# Patient Record
Sex: Male | Born: 1935 | Race: White | Hispanic: No | Marital: Married | Smoking: Former smoker
Health system: Southern US, Community
[De-identification: ages and names within clinical notes are randomized; demographics above are authoritative.]

## PROBLEM LIST (undated history)

## (undated) DIAGNOSIS — N183 Chronic kidney disease, stage 3 unspecified: Secondary | ICD-10-CM

## (undated) DIAGNOSIS — C801 Malignant (primary) neoplasm, unspecified: Secondary | ICD-10-CM

## (undated) DIAGNOSIS — Z87891 Personal history of nicotine dependence: Secondary | ICD-10-CM

## (undated) DIAGNOSIS — J449 Chronic obstructive pulmonary disease, unspecified: Secondary | ICD-10-CM

## (undated) DIAGNOSIS — G4733 Obstructive sleep apnea (adult) (pediatric): Secondary | ICD-10-CM

## (undated) DIAGNOSIS — T7840XA Allergy, unspecified, initial encounter: Secondary | ICD-10-CM

## (undated) DIAGNOSIS — Z9114 Patient's other noncompliance with medication regimen: Secondary | ICD-10-CM

## (undated) DIAGNOSIS — Z91148 Patient's other noncompliance with medication regimen for other reason: Secondary | ICD-10-CM

## (undated) DIAGNOSIS — E785 Hyperlipidemia, unspecified: Secondary | ICD-10-CM

## (undated) DIAGNOSIS — E1336 Other specified diabetes mellitus with diabetic cataract: Secondary | ICD-10-CM

## (undated) DIAGNOSIS — R319 Hematuria, unspecified: Secondary | ICD-10-CM

## (undated) DIAGNOSIS — I10 Essential (primary) hypertension: Secondary | ICD-10-CM

## (undated) DIAGNOSIS — K219 Gastro-esophageal reflux disease without esophagitis: Secondary | ICD-10-CM

## (undated) HISTORY — DX: Personal history of nicotine dependence: Z87.891

## (undated) HISTORY — DX: Patient's other noncompliance with medication regimen: Z91.14

## (undated) HISTORY — DX: Patient's other noncompliance with medication regimen for other reason: Z91.148

## (undated) HISTORY — DX: Hyperlipidemia, unspecified: E78.5

## (undated) HISTORY — DX: Malignant (primary) neoplasm, unspecified: C80.1

## (undated) HISTORY — DX: Obstructive sleep apnea (adult) (pediatric): G47.33

## (undated) HISTORY — DX: Allergy, unspecified, initial encounter: T78.40XA

## (undated) HISTORY — DX: Other specified diabetes mellitus with diabetic cataract: E13.36

## (undated) HISTORY — PX: SKIN GRAFT: SHX250

## (undated) HISTORY — DX: Chronic obstructive pulmonary disease, unspecified: J44.9

## (undated) HISTORY — PX: CATARACT EXTRACTION: SUR2

## (undated) HISTORY — DX: Essential (primary) hypertension: I10

## (undated) HISTORY — DX: Gastro-esophageal reflux disease without esophagitis: K21.9

## (undated) HISTORY — DX: Chronic kidney disease, stage 3 unspecified: N18.30

## (undated) HISTORY — DX: Chronic kidney disease, stage 3 (moderate): N18.3

## (undated) HISTORY — PX: ELBOW SURGERY: SHX618

## (undated) HISTORY — DX: Hematuria, unspecified: R31.9

---

## 2001-01-28 DIAGNOSIS — K219 Gastro-esophageal reflux disease without esophagitis: Secondary | ICD-10-CM

## 2001-01-28 HISTORY — DX: Gastro-esophageal reflux disease without esophagitis: K21.9

## 2003-11-07 ENCOUNTER — Other Ambulatory Visit: Payer: Self-pay

## 2003-11-07 ENCOUNTER — Ambulatory Visit: Payer: Self-pay | Admitting: Ophthalmology

## 2003-11-15 ENCOUNTER — Ambulatory Visit: Payer: Self-pay | Admitting: Ophthalmology

## 2005-01-28 LAB — HM COLONOSCOPY: HM Colonoscopy: NORMAL

## 2005-08-07 ENCOUNTER — Ambulatory Visit: Payer: Self-pay | Admitting: Gastroenterology

## 2007-03-04 ENCOUNTER — Ambulatory Visit: Payer: Self-pay | Admitting: Internal Medicine

## 2007-03-17 ENCOUNTER — Ambulatory Visit: Payer: Self-pay | Admitting: Internal Medicine

## 2007-03-18 ENCOUNTER — Ambulatory Visit: Payer: Self-pay | Admitting: Internal Medicine

## 2007-05-29 ENCOUNTER — Ambulatory Visit: Payer: Self-pay | Admitting: Internal Medicine

## 2007-06-09 ENCOUNTER — Ambulatory Visit: Payer: Self-pay | Admitting: Internal Medicine

## 2007-07-24 ENCOUNTER — Ambulatory Visit: Payer: Self-pay | Admitting: Internal Medicine

## 2007-07-29 ENCOUNTER — Ambulatory Visit: Payer: Self-pay | Admitting: Internal Medicine

## 2009-12-27 ENCOUNTER — Ambulatory Visit: Payer: Self-pay | Admitting: Ophthalmology

## 2010-09-17 ENCOUNTER — Telehealth: Payer: Self-pay | Admitting: Internal Medicine

## 2010-09-17 NOTE — Telephone Encounter (Signed)
What is the easiest way to do this ?

## 2010-09-17 NOTE — Telephone Encounter (Signed)
Patients wife called and wanted to know if you could write a letter stating he is your patient and he uses a CPAP machine.  Patients wife stated their energy bill has been more because of having to use the CPAP machine, so they need the letter so they can get assistance on their energy bill.  Please advise.

## 2010-09-18 ENCOUNTER — Encounter: Payer: Self-pay | Admitting: Internal Medicine

## 2010-09-25 NOTE — Telephone Encounter (Signed)
Per physician resolved.

## 2010-09-26 ENCOUNTER — Other Ambulatory Visit: Payer: Self-pay | Admitting: Internal Medicine

## 2010-09-26 MED ORDER — AMLODIPINE BESYLATE 10 MG PO TABS: 10.0000 mg | ORAL_TABLET | Freq: Every day | ORAL | Status: DC

## 2010-10-04 ENCOUNTER — Other Ambulatory Visit: Payer: Self-pay | Admitting: Internal Medicine

## 2010-10-05 MED ORDER — OMEPRAZOLE 20 MG PO CPDR: 20.0000 mg | DELAYED_RELEASE_CAPSULE | Freq: Every day | ORAL | Status: DC

## 2010-10-18 ENCOUNTER — Encounter: Payer: Self-pay | Admitting: Internal Medicine

## 2010-10-19 ENCOUNTER — Ambulatory Visit (INDEPENDENT_AMBULATORY_CARE_PROVIDER_SITE_OTHER): Payer: Medicare Other | Admitting: Internal Medicine

## 2010-10-19 ENCOUNTER — Encounter: Payer: Self-pay | Admitting: Internal Medicine

## 2010-10-19 VITALS — BP 207/72 | HR 51 | Temp 98.1°F | Resp 16 | Ht 64.0 in | Wt 161.5 lb

## 2010-10-19 DIAGNOSIS — E119 Type 2 diabetes mellitus without complications: Secondary | ICD-10-CM

## 2010-10-19 DIAGNOSIS — E1321 Other specified diabetes mellitus with diabetic nephropathy: Secondary | ICD-10-CM

## 2010-10-19 DIAGNOSIS — I10 Essential (primary) hypertension: Secondary | ICD-10-CM

## 2010-10-19 DIAGNOSIS — N058 Unspecified nephritic syndrome with other morphologic changes: Secondary | ICD-10-CM

## 2010-10-19 DIAGNOSIS — R600 Localized edema: Secondary | ICD-10-CM

## 2010-10-19 DIAGNOSIS — E1329 Other specified diabetes mellitus with other diabetic kidney complication: Secondary | ICD-10-CM

## 2010-10-19 DIAGNOSIS — R609 Edema, unspecified: Secondary | ICD-10-CM

## 2010-10-19 MED ORDER — LOSARTAN POTASSIUM 100 MG PO TABS: 100.0000 mg | ORAL_TABLET | Freq: Every day | ORAL | Status: DC

## 2010-10-19 MED ORDER — LOSARTAN POTASSIUM 50 MG PO TABS: 100.0000 mg | ORAL_TABLET | Freq: Every day | ORAL | Status: DC

## 2010-10-19 MED ORDER — FUROSEMIDE 40 MG PO TABS: 40.0000 mg | ORAL_TABLET | Freq: Two times a day (BID) | ORAL | Status: DC

## 2010-10-19 NOTE — Progress Notes (Signed)
  Subjective:    Patient ID: Jason Aguilar, male    DOB: 08-Mar-1935, 75 y.o.   MRN: [id number]  HPI Worsening lower extremity edema,  Uncontrolled hypertension.       Review of Systems     Objective:   Physical Exam        Assessment & Plan:  Hyperkalemia: in the setting of combined ACE INhibitor and ARB use for control of malignant hypertension.  Will stop lisinopppril, increase furosemdie to 40 mg bid, recheck in 4 days.   Malignant hypertension:  On 4 medicatiosn with noncomplaince an issue,  Last visit he was given clinidine and slept for 24 hours per wife.  Will start hydralazine, stop lisinopril, contineu amloidpine metoprolol and losartan.  Subjective:     Jason Aguilar is a 75 y.o. male who presents for follow up of diabetes.. Current symptoms include: none. Patient denies foot ulcerations. Evaluation to date has been: fasting blood sugar, fasting lipid panel, hemoglobin A1C and microalbuminuria. Home sugars: patient does not check sugars. Current treatments: Increased dose of insulin which has been unable to assess effectiveness and Continued metformin which has been not very effective. Last dilated eye exam unsure per patient.  The following portions of the patient's history were reviewed and updated as appropriate: allergies, current medications, past family history, past medical history, past social history, past surgical history and problem list.  Review of Systems A comprehensive review of systems was negative except for: Cardiovascular: positive for fatigue    Objective:    General appearance: alert, cooperative and appears stated age Nose: Nares normal. Septum midline. Mucosa normal. No drainage or sinus tenderness. Throat: lips, mucosa, and tongue normal; teeth and gums normal Lungs: clear to auscultation bilaterally Chest wall: no tenderness Heart: regular rate and rhythm, S1, S2 normal, no murmur, click, rub or gallop Abdomen: soft, non-tender; bowel sounds  normal; no masses,  no organomegaly Extremities: extremities normal, atraumatic, no cyanosis or edema and edema     Patient was not evaluated for proper footwear and sizing.  Laboratory: No components found with this basename: A1C      Assessment:    Diabetes mellitus Type II, under poor control.    Plan:    Discussed general issues about diabetes pathophysiology and management. Counseling at today's visit: discussed the need for weight loss and discussed the advantages of a diet low in carbohydrates. Reminded to get yearly retinal exam. Increased dose of insulin: by 5 units.

## 2010-10-19 NOTE — Patient Instructions (Signed)
We are increasing the losartan to 100 mg daily for you high blood pressure.  Confirm that you are taking fursoemide twice daily for fluid and blood pressure  Return in 1 months

## 2010-10-20 LAB — COMPREHENSIVE METABOLIC PANEL
ALT: 12 U/L (ref 0–53)
Alkaline Phosphatase: 70 U/L (ref 39–117)
CO2: 19 mEq/L (ref 19–32)
Creat: 1.77 mg/dL — ABNORMAL HIGH (ref 0.50–1.35)
Glucose, Bld: 157 mg/dL — ABNORMAL HIGH (ref 70–99)
Sodium: 140 mEq/L (ref 135–145)
Total Bilirubin: 0.4 mg/dL (ref 0.3–1.2)
Total Protein: 7 g/dL (ref 6.0–8.3)

## 2010-10-20 LAB — HEMOGLOBIN A1C
Hgb A1c MFr Bld: 8.2 % — ABNORMAL HIGH (ref <5.7)
Mean Plasma Glucose: 189 mg/dL — ABNORMAL HIGH (ref <117)

## 2010-10-20 LAB — MICROALBUMIN / CREATININE URINE RATIO
Creatinine, Urine: 90.2 mg/dL
Microalb, Ur: 208.26 mg/dL — ABNORMAL HIGH (ref 0.00–1.89)

## 2010-10-21 ENCOUNTER — Encounter: Payer: Self-pay | Admitting: Internal Medicine

## 2010-10-21 DIAGNOSIS — E785 Hyperlipidemia, unspecified: Secondary | ICD-10-CM | POA: Insufficient documentation

## 2010-10-21 DIAGNOSIS — I10 Essential (primary) hypertension: Secondary | ICD-10-CM | POA: Insufficient documentation

## 2010-10-21 DIAGNOSIS — E1321 Other specified diabetes mellitus with diabetic nephropathy: Secondary | ICD-10-CM | POA: Insufficient documentation

## 2010-10-21 DIAGNOSIS — Z9114 Patient's other noncompliance with medication regimen: Secondary | ICD-10-CM | POA: Insufficient documentation

## 2010-10-21 DIAGNOSIS — G4733 Obstructive sleep apnea (adult) (pediatric): Secondary | ICD-10-CM | POA: Insufficient documentation

## 2010-10-21 MED ORDER — HYDRALAZINE HCL 50 MG PO TABS: 50.0000 mg | ORAL_TABLET | Freq: Three times a day (TID) | ORAL | Status: DC

## 2010-10-21 MED ORDER — INSULIN DETEMIR 100 UNIT/ML ~~LOC~~ SOLN: 30.0000 [IU] | Freq: Every day | SUBCUTANEOUS | Status: DC

## 2010-10-21 NOTE — Assessment & Plan Note (Signed)
Now with increased proteinuria,  Decreased GFR and hyperkalemia by currentl labs.  Will stop lisinopril and continue losartan.  Refer to nephrology.

## 2010-10-22 ENCOUNTER — Ambulatory Visit: Payer: Medicare Other | Admitting: Internal Medicine

## 2010-10-22 ENCOUNTER — Telehealth: Payer: Self-pay | Admitting: Internal Medicine

## 2010-10-22 DIAGNOSIS — I10 Essential (primary) hypertension: Secondary | ICD-10-CM

## 2010-10-22 MED ORDER — FUROSEMIDE 40 MG PO TABS: 40.0000 mg | ORAL_TABLET | Freq: Two times a day (BID) | ORAL | Status: DC

## 2010-10-22 NOTE — Telephone Encounter (Signed)
THE MEDS YOU TALKED TO WITH MR Platz  ABOUT FUROSEMIDE HE DOES NOT HAVE THIS AT HOME AT ALL.  PT NEEDS RX MEDICAP

## 2010-10-22 NOTE — Telephone Encounter (Signed)
Rx has been called in  

## 2010-10-23 ENCOUNTER — Telehealth: Payer: Self-pay | Admitting: Internal Medicine

## 2010-10-23 NOTE — Telephone Encounter (Signed)
Please call [phone] can leave message on voice mail per evelyn

## 2010-10-26 ENCOUNTER — Ambulatory Visit: Payer: Self-pay | Admitting: Internal Medicine

## 2010-11-08 ENCOUNTER — Encounter: Payer: Self-pay | Admitting: Internal Medicine

## 2010-11-19 ENCOUNTER — Ambulatory Visit: Payer: Medicare Other | Admitting: Internal Medicine

## 2010-11-26 ENCOUNTER — Ambulatory Visit: Payer: Medicare Other | Admitting: Internal Medicine

## 2010-12-03 ENCOUNTER — Other Ambulatory Visit: Payer: Self-pay | Admitting: Internal Medicine

## 2010-12-05 MED ORDER — SIMVASTATIN 40 MG PO TABS: 40.0000 mg | ORAL_TABLET | Freq: Every day | ORAL | Status: DC

## 2010-12-13 ENCOUNTER — Ambulatory Visit: Payer: Self-pay | Admitting: Nephrology

## 2010-12-26 ENCOUNTER — Other Ambulatory Visit: Payer: Self-pay | Admitting: Internal Medicine

## 2010-12-26 MED ORDER — MICROLET LANCETS MISC: Status: DC

## 2011-02-04 ENCOUNTER — Other Ambulatory Visit: Payer: Self-pay | Admitting: *Deleted

## 2011-02-04 MED ORDER — INSULIN PEN NEEDLE 31G X 5 MM MISC: Status: DC

## 2011-02-27 ENCOUNTER — Ambulatory Visit: Payer: Medicare Other | Admitting: Endocrinology

## 2011-02-27 ENCOUNTER — Ambulatory Visit: Payer: Self-pay | Admitting: Urology

## 2011-02-27 DIAGNOSIS — I10 Essential (primary) hypertension: Secondary | ICD-10-CM

## 2011-02-27 LAB — BASIC METABOLIC PANEL
BUN: 71 mg/dL — ABNORMAL HIGH (ref 7–18)
Calcium, Total: 9.9 mg/dL (ref 8.5–10.1)
EGFR (African American): 31 — ABNORMAL LOW
EGFR (Non-African Amer.): 25 — ABNORMAL LOW
Glucose: 137 mg/dL — ABNORMAL HIGH (ref 65–99)
Osmolality: 310 (ref 275–301)
Potassium: 4.6 mmol/L (ref 3.5–5.1)
Sodium: 144 mmol/L (ref 136–145)

## 2011-02-27 LAB — HEMOGLOBIN: HGB: 12.7 g/dL — ABNORMAL LOW (ref 13.0–18.0)

## 2011-02-28 LAB — URINE CULTURE

## 2011-03-01 DIAGNOSIS — C801 Malignant (primary) neoplasm, unspecified: Secondary | ICD-10-CM

## 2011-03-01 HISTORY — DX: Malignant (primary) neoplasm, unspecified: C80.1

## 2011-03-04 ENCOUNTER — Other Ambulatory Visit: Payer: Self-pay | Admitting: Internal Medicine

## 2011-03-04 ENCOUNTER — Telehealth: Payer: Self-pay | Admitting: Internal Medicine

## 2011-03-04 NOTE — Telephone Encounter (Signed)
Appt made for surgical clearance.

## 2011-03-04 NOTE — Telephone Encounter (Signed)
I need to do the clearance.

## 2011-03-04 NOTE — Telephone Encounter (Signed)
[phone] Jason Aguilar called pt is having bladder surgery 2/13 and pt needs surgical clearance does dr Darrick Huntsman need to do this or dr Cherylann Ratel because he changed bp meds.  Daughter stated as far as she know bp is stable

## 2011-03-08 ENCOUNTER — Ambulatory Visit (INDEPENDENT_AMBULATORY_CARE_PROVIDER_SITE_OTHER): Payer: Medicare Other | Admitting: Internal Medicine

## 2011-03-08 ENCOUNTER — Encounter: Payer: Self-pay | Admitting: Internal Medicine

## 2011-03-08 VITALS — BP 140/56 | HR 60 | Temp 97.8°F | Wt 158.0 lb

## 2011-03-08 DIAGNOSIS — E119 Type 2 diabetes mellitus without complications: Secondary | ICD-10-CM

## 2011-03-08 DIAGNOSIS — D3500 Benign neoplasm of unspecified adrenal gland: Secondary | ICD-10-CM

## 2011-03-08 DIAGNOSIS — E279 Disorder of adrenal gland, unspecified: Secondary | ICD-10-CM

## 2011-03-08 NOTE — Patient Instructions (Signed)
You can try taking 1 or 2 otc tylenol at bedtime for your nightttime knee pain .   We are arranging the referral to an endocrinologist in [place] to decide what to do about the bilateral adrenal nodules (tumors on your adrenal glands)  I am checking your hgba1c today

## 2011-03-10 NOTE — Progress Notes (Signed)
Subjective:    Jason Aguilar is a 76 y.o. male who presents to the office today for a preoperative consultation at the request of Dr. Lonna Cobb who plans on performing bladder biopsy  on February 20. This consultation is requested for the specific conditions prompting preoperative evaluation (i.e. because of potential affect on operative risk): Diabetes and renal insufficiency. Planned anesthesia: epidural and IV sedation. The patient has the following known anesthesia issues: none. Patients bleeding risk: no recent abnormal bleeding. Patient does not have objections to receiving blood products if needed.  The following portions of the patient's history were reviewed and updated as appropriate: allergies, current medications, past family history, past medical history, past social history, past surgical history and problem list.  Review of Systems A comprehensive review of systems was negative.    Objective:    BP 140/56  Pulse 60  Temp(Src) 97.8 F (36.6 C) (Oral)  Wt 158 lb (71.668 kg)  SpO2 94%  General Appearance:    Alert, cooperative, no distress, appears stated age  Head:    Normocephalic, without obvious abnormality, atraumatic  Eyes:    PERRL, conjunctiva/corneas clear, EOM's intact, fundi    benign, both eyes       Ears:    Normal TM's and external ear canals, both ears  Nose:   Nares normal, septum midline, mucosa normal, no drainage    or sinus tenderness  Throat:   Lips, mucosa, and tongue normal; teeth and gums normal  Neck:   Supple, symmetrical, trachea midline, no adenopathy;       thyroid:  No enlargement/tenderness/nodules; no carotid   bruit or JVD  Back:     Symmetric, no curvature, ROM normal, no CVA tenderness  Lungs:     Clear to auscultation bilaterally, respirations unlabored  Chest wall:    No tenderness or deformity  Heart:    Regular rate and rhythm, S1 and S2 normal, no murmur, rub   or gallop  Abdomen:     Soft, non-tender, bowel sounds active all four  quadrants,    no masses, no organomegaly  Genitalia:    Normal male without lesion, discharge or tenderness  Rectal:    Normal tone, normal prostate, no masses or tenderness;   guaiac negative stool  Extremities:   Extremities normal, atraumatic, no cyanosis or edema  Pulses:   2+ and symmetric all extremities  Skin:   Skin color, texture, turgor normal, no rashes or lesions  Lymph nodes:   Cervical, supraclavicular, and axillary nodes normal  Neurologic:   CNII-XII intact. Normal strength, sensation and reflexes      throughout    Predictors of intubation difficulty:  Morbid obesity? no  Anatomically abnormal facies? no  Prominent incisors? no  Receding mandible? no  Short, thick neck? no  Neck range of motion: normal   Dentition: Missing teeth (several)  Cardiographics ECG: normal sinus rhythm, no blocks or conduction defects, no ischemic changes Echocardiogram: not done  Imaging Chest x-ray: to be ordered by Anaesthesiology   Lab Review  No visits with results within 2 Month(s) from this visit. Latest known visit with results is:  Office Visit on 10/19/2010  Component Date Value  . Sodium 10/19/2010 140   . Potassium 10/19/2010 5.4*  . Chloride 10/19/2010 106   . CO2 10/19/2010 19   . Glucose, Bld 10/19/2010 157*  . BUN 10/19/2010 28*  . Creat 10/19/2010 1.77*  . Total Bilirubin 10/19/2010 0.4   . Alkaline Phosphatase 10/19/2010 70   .  AST 10/19/2010 16   . ALT 10/19/2010 12   . Total Protein 10/19/2010 7.0   . Albumin 10/19/2010 4.4   . Calcium 10/19/2010 11.0*  . Microalb, Ur 10/19/2010 208.26*  . Creatinine, Urine 10/19/2010 90.2   . Microalb Creat Ratio 10/19/2010 2308.9*  . Hemoglobin A1C 10/19/2010 8.2*  . Mean Plasma Glucose 10/19/2010 189*      Assessment:      76 y.o. male with planned surgery as above.   Known risk factors for perioperative complications: Diabetes mellitus   Difficulty with intubation is not anticipated.  Cardiac Risk  Estimation: low to moderate due to history of DM , insulin dependent Current medications which may produce withdrawal symptoms if withheld perioperatively: none    Plan:    1. Preoperative workup as follows none. 2. Change in medication regimen before surgery: discontinue ASA 14 days before surgery and discontinue Metformin 24 hours before surgery. 3. Prophylaxis for cardiac events with perioperative beta-blockers: should be considered, specific regimen per anesthesia. 4. Invasive hemodynamic monitoring perioperatively: not indicated. 5. Deep vein thrombosis prophylaxis postoperatively:regimen to be chosen by surgical team. 6. Surveillance for postoperative MI with ECG immediately postoperatively and on postoperative days 1 and 2 AND troponin levels 24 hours postoperatively and on day 4 or hospital discharge (whichever comes first): should be considered. 7. Other measures: Careful attention to perioperative glycemic control (with insulin sliding scale ).

## 2011-03-13 ENCOUNTER — Ambulatory Visit: Payer: Self-pay | Admitting: Urology

## 2011-03-13 HISTORY — PX: TRANSURETHRAL RESECTION OF BLADDER TUMOR: SHX2575

## 2011-03-15 LAB — PATHOLOGY REPORT

## 2011-03-26 ENCOUNTER — Ambulatory Visit: Payer: Medicare Other | Admitting: Endocrinology

## 2011-04-01 ENCOUNTER — Ambulatory Visit: Payer: Medicare Other | Admitting: Endocrinology

## 2011-04-01 ENCOUNTER — Other Ambulatory Visit: Payer: Self-pay | Admitting: Internal Medicine

## 2011-04-02 ENCOUNTER — Other Ambulatory Visit: Payer: Self-pay | Admitting: Internal Medicine

## 2011-04-02 MED ORDER — GLIPIZIDE 5 MG PO TABS: 5.0000 mg | ORAL_TABLET | Freq: Every day | ORAL | Status: DC

## 2011-04-05 ENCOUNTER — Ambulatory Visit (INDEPENDENT_AMBULATORY_CARE_PROVIDER_SITE_OTHER): Payer: Medicare Other | Admitting: Internal Medicine

## 2011-04-05 ENCOUNTER — Encounter: Payer: Self-pay | Admitting: Internal Medicine

## 2011-04-05 DIAGNOSIS — L608 Other nail disorders: Secondary | ICD-10-CM

## 2011-04-05 DIAGNOSIS — E119 Type 2 diabetes mellitus without complications: Secondary | ICD-10-CM

## 2011-04-05 DIAGNOSIS — IMO0001 Reserved for inherently not codable concepts without codable children: Secondary | ICD-10-CM

## 2011-04-05 DIAGNOSIS — C801 Malignant (primary) neoplasm, unspecified: Secondary | ICD-10-CM

## 2011-04-05 DIAGNOSIS — M545 Low back pain, unspecified: Secondary | ICD-10-CM

## 2011-04-05 DIAGNOSIS — J069 Acute upper respiratory infection, unspecified: Secondary | ICD-10-CM | POA: Insufficient documentation

## 2011-04-05 DIAGNOSIS — E1165 Type 2 diabetes mellitus with hyperglycemia: Secondary | ICD-10-CM

## 2011-04-05 DIAGNOSIS — N058 Unspecified nephritic syndrome with other morphologic changes: Secondary | ICD-10-CM

## 2011-04-05 DIAGNOSIS — E1321 Other specified diabetes mellitus with diabetic nephropathy: Secondary | ICD-10-CM

## 2011-04-05 DIAGNOSIS — Q846 Other congenital malformations of nails: Secondary | ICD-10-CM

## 2011-04-05 DIAGNOSIS — E1329 Other specified diabetes mellitus with other diabetic kidney complication: Secondary | ICD-10-CM

## 2011-04-05 MED ORDER — GUAIFENESIN-CODEINE 100-10 MG/5ML PO SYRP: 5.0000 mL | ORAL_SOLUTION | Freq: Three times a day (TID) | ORAL | Status: AC | PRN

## 2011-04-05 MED ORDER — INSULIN DETEMIR 100 UNIT/ML ~~LOC~~ SOLN: 30.0000 [IU] | Freq: Every day | SUBCUTANEOUS | Status: DC

## 2011-04-05 NOTE — Patient Instructions (Addendum)
You currently have a viral respiratory infection that will run it course in 7 to 10 days.  You do not need antibiotics at this time.  If you develop t > 100.4  Or facial or ear pain, or starts coughing up brownish/green stuff or blowing it out of his nose, it may indicate the need for antibiotics.  Call our office on Monday if no better with the cold (we will add a chest x ray to the lumbar spine x rays you are going to get on Monday)   I am prescribing a cough medicine that has codeine in it to quiet the cough .  (Cheritussin)  Gargle with salt water for scratchy throat.   ------------------------------------------------------------------------------------------------------------------------------------------------------------------------------   You needed xrays for your lumbar spine and these will need to be done at [facility],  This office is on [address] about [address],  across from the [place],  Before you pass [employer] . It is on the [place].

## 2011-04-05 NOTE — Progress Notes (Signed)
Subjective:    Patient ID: Jason Aguilar, male    DOB: 1935/03/12, 76 y.o.   MRN: [id number]  HPI  Jason Aguilar is a 76 yr old white male with a history of remote tobacco abuse,  Diabetes mellitus, and recent diagnosis of bladder cancer s/p recent TURBT by Jason Aguilar who presents with a dry cough which started about 3 days ago,  Temp 99.6 is Tmax.  No sinus pain, headaches or myalgias.  No wheezing or shortness of breath.  Daughter is asking if patient's bladder cancer can be attributed to his history of use of Actos , which he used for 2 years to manage his diabetes prior to use of insulin.  His second issue today is back pain which has been present for several months and radiates to both legs. Pain is persistent,  Aggravated by sitting and walking.   Past Medical History  Diagnosis Date  . Glaucoma     both eyes Jason Aguilar and Jason Aguilar  . Hematuria syndrome     s/p negative cystoscopy  . GERD (gastroesophageal reflux disease) 2003    EGD  . Allergy   . Diabetes mellitus     Type 2  . Hypertension   . Hyperlipidemia   . Obstructive sleep apnea of adult     CPAP at 8 cm H20  . Noncompliance with medication regimen    Current Outpatient Prescriptions on File Prior to Visit  Medication Sig Dispense Refill  . amLODipine (NORVASC) 10 MG tablet Take 1 tablet (10 mg total) by mouth daily.  30 tablet  11  . aspirin 81 MG tablet Take 81 mg by mouth daily.        . fexofenadine (ALLEGRA) 180 MG tablet Take 180 mg by mouth daily.        . furosemide (LASIX) 40 MG tablet Take 1 tablet (40 mg total) by mouth 2 (two) times daily.  60 tablet  5  . glipiZIDE (GLUCOTROL) 5 MG tablet Take 1 tablet (5 mg total) by mouth daily.  30 tablet  3  . hydrALAZINE (APRESOLINE) 50 MG tablet Take 1 tablet (50 mg total) by mouth 3 (three) times daily.  90 tablet  5  . Insulin Pen Needle 31G X 5 MM MISC Inject insulin once a day  100 each  5  . losartan (COZAAR) 100 MG tablet Take 1 tablet (100 mg total) by mouth  daily.  30 tablet  5  . metFORMIN (GLUCOPHAGE) 1000 MG tablet Take 1,000 mg by mouth 2 (two) times daily with a meal.        . metoprolol (LOPRESSOR) 50 MG tablet Take 50 mg by mouth 2 (two) times daily.        Marland Kitchen MICROLET LANCETS MISC Patient test blood sugar twice daily  100 each  5  . omeprazole (PRILOSEC) 20 MG capsule TAKE ONE (1) CAPSULE EACH DAY  30 capsule  3  . simvastatin (ZOCOR) 40 MG tablet TAKE ONE TABLET DAILY AT BEDTIME  30 tablet  3  . travoprost, benzalkonium, (TRAVATAN) 0.004 % ophthalmic solution One drop both eyes at bedtime.           Review of Systems  Constitutional: Negative for fever, chills, diaphoresis, activity change, appetite change, fatigue and unexpected weight change.  HENT: Negative for hearing loss, ear pain, nosebleeds, congestion, sore throat, facial swelling, rhinorrhea, sneezing, drooling, mouth sores, trouble swallowing, neck pain, neck stiffness, dental problem, voice change, postnasal drip, sinus pressure, tinnitus and ear discharge.  Eyes: Negative for photophobia, pain, discharge, redness, itching and visual disturbance.  Respiratory: Positive for cough. Negative for apnea, choking, chest tightness, shortness of breath, wheezing and stridor.   Cardiovascular: Negative for chest pain, palpitations and leg swelling.  Gastrointestinal: Negative for nausea, vomiting, abdominal pain, diarrhea, constipation, blood in stool, abdominal distention, anal bleeding and rectal pain.  Genitourinary: Negative for dysuria, urgency, frequency, hematuria, flank pain, decreased urine volume, scrotal swelling, difficulty urinating and testicular pain.  Musculoskeletal: Negative for myalgias, back pain, joint swelling, arthralgias and gait problem.  Skin: Negative for color change, rash and wound.  Neurological: Negative for dizziness, tremors, seizures, syncope, speech difficulty, weakness, light-headedness, numbness and headaches.  Psychiatric/Behavioral: Negative for  suicidal ideas, hallucinations, behavioral problems, confusion, sleep disturbance, dysphoric mood, decreased concentration and agitation. The patient is not nervous/anxious.        Objective:   Physical Exam  Constitutional: He is oriented to person, place, and time. He appears well-developed and well-nourished.  HENT:  Head: Normocephalic and atraumatic.  Mouth/Throat: Oropharynx is clear and moist.  Eyes: Conjunctivae and EOM are normal.  Neck: Normal range of motion. Neck supple. No JVD present. No thyromegaly present.  Cardiovascular: Normal rate, regular rhythm and normal heart sounds.   Pulmonary/Chest: Effort normal and breath sounds normal. He has no wheezes. He has no rales.  Abdominal: Soft. Bowel sounds are normal. He exhibits no mass. There is no tenderness. There is no rebound.  Musculoskeletal: Normal range of motion. He exhibits no edema.  Neurological: He is alert and oriented to person, place, and time.  Skin: Skin is warm and dry.  Psychiatric: He has a normal mood and affect.      Assessment & Plan:    Back pain, lumbosacral With pain and numbness affecting both legs.  Plain films ordered,  With plans for MRI if the films suggest spinal stenosis or herniated disk .  URI, acute Symptoms and exam are viral currently.  No indications for antibiotics.   Diabetes mellitus type II, uncontrolled Improved, slightly with repeat A1c 7.8 since starting insulin and stopping actos.  His foot exam today showed normal sensation and pulses, but his toenails are thickened, curved and too long.  Podiatry referral made.   Nephropathy due to secondary diabetes mellitus He has regular nephrology followup with Jason Aguilar, who found the bladder tumor during evaluation for renal insufficiency.   transitional cell, bladder S/p TURBT by Jason Aguilar on Feb 13th.  AWaiting records,  Patient has limited understanding of treatment plan.    Updated Medication List Outpatient Encounter  Prescriptions as of 04/05/2011  Medication Sig Dispense Refill  . amLODipine (NORVASC) 10 MG tablet Take 1 tablet (10 mg total) by mouth daily.  30 tablet  11  . aspirin 81 MG tablet Take 81 mg by mouth daily.        . fexofenadine (ALLEGRA) 180 MG tablet Take 180 mg by mouth daily.        . furosemide (LASIX) 40 MG tablet Take 1 tablet (40 mg total) by mouth 2 (two) times daily.  60 tablet  5  . glipiZIDE (GLUCOTROL) 5 MG tablet Take 1 tablet (5 mg total) by mouth daily.  30 tablet  3  . hydrALAZINE (APRESOLINE) 50 MG tablet Take 1 tablet (50 mg total) by mouth 3 (three) times daily.  90 tablet  5  . insulin detemir (LEVEMIR) 100 UNIT/ML injection Inject 30 Units into the skin at bedtime.  10 mL  5  .  Insulin Pen Needle 31G X 5 MM MISC Inject insulin once a day  100 each  5  . losartan (COZAAR) 100 MG tablet Take 1 tablet (100 mg total) by mouth daily.  30 tablet  5  . metFORMIN (GLUCOPHAGE) 1000 MG tablet Take 1,000 mg by mouth 2 (two) times daily with a meal.        . metoprolol (LOPRESSOR) 50 MG tablet Take 50 mg by mouth 2 (two) times daily.        Marland Kitchen MICROLET LANCETS MISC Patient test blood sugar twice daily  100 each  5  . omeprazole (PRILOSEC) 20 MG capsule TAKE ONE (1) CAPSULE EACH DAY  30 capsule  3  . simvastatin (ZOCOR) 40 MG tablet TAKE ONE TABLET DAILY AT BEDTIME  30 tablet  3  . travoprost, benzalkonium, (TRAVATAN) 0.004 % ophthalmic solution One drop both eyes at bedtime.      Marland Kitchen DISCONTD: insulin detemir (LEVEMIR) 100 UNIT/ML injection Inject 30 Units into the skin at bedtime.  10 mL  5  . guaiFENesin-codeine (CHERATUSSIN AC) 100-10 MG/5ML syrup Take 5 mLs by mouth 3 (three) times daily as needed for cough.  240 mL  0

## 2011-04-07 ENCOUNTER — Encounter: Payer: Self-pay | Admitting: Internal Medicine

## 2011-04-07 DIAGNOSIS — E1165 Type 2 diabetes mellitus with hyperglycemia: Secondary | ICD-10-CM | POA: Insufficient documentation

## 2011-04-07 DIAGNOSIS — C801 Malignant (primary) neoplasm, unspecified: Secondary | ICD-10-CM | POA: Insufficient documentation

## 2011-04-07 DIAGNOSIS — M545 Low back pain: Secondary | ICD-10-CM | POA: Insufficient documentation

## 2011-04-07 NOTE — Assessment & Plan Note (Signed)
Improved, slightly with repeat A1c 7.8 since starting insulin and stopping actos.  His foot exam today showed normal sensation and pulses, but his toenails are thickened, curved and too long.  Podiatry referral made.

## 2011-04-07 NOTE — Assessment & Plan Note (Signed)
He has regular nephrology followup with Dr. Jeanella Cara, who found the bladder tumor during evaluation for renal insufficiency.

## 2011-04-07 NOTE — Assessment & Plan Note (Signed)
With pain and numbness affecting both legs.  Plain films ordered,  With plans for MRI if the films suggest spinal stenosis or herniated disk .

## 2011-04-07 NOTE — Assessment & Plan Note (Signed)
S/p TURBT by Dr. Lonna Cobb on Feb 13th.  AWaiting records,  Patient has limited understanding of treatment plan.

## 2011-04-07 NOTE — Assessment & Plan Note (Signed)
Symptoms and exam are viral currently.  No indications for antibiotics.

## 2011-04-08 ENCOUNTER — Telehealth: Payer: Self-pay | Admitting: Internal Medicine

## 2011-04-08 NOTE — Telephone Encounter (Signed)
Triage Call Report Triage Record Num: [id number] Operator: Reino Bellis Patient Name: Jason Aguilar Call Date & Time: 04/06/2011 10:47:29AM Patient Phone: [phone] PCP: Duncan Dull Patient Gender: Male PCP Fax : [phone] Patient DOB: 29-Apr-1935 Practice Name: [facility] Reason for Call: Caller: Clint Bolder; PCP: Duncan Dull; CB#: [phone]; Call regarding Medication Issue; Medication(s): pt is diabetic, cough medication not called in, ; [name]/Wife calling about medication: Cheratussin, that was supposed to be faxed in on 04/05/11. Per EPIC - guaiFENesin-codeine (CHERATUSSIN AC) 100-10 MG/5ML syrup [[id number]] Order Details - Dose: 5 mL, Route: Oral Frequency: 3 times daily PRN for cough Dispense Quantity: 240 mL Refills: 0, Take 5 mLs by mouth 3 (three) times daily as needed for cough. Written Date: 04/05/11 Authorized by: Sherlene Shams, MD Ordering User: Sherlene Shams, MD. Pharmacy: [facility] [phone] [place], [place] - [address] 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 RX info per [employer] for [employer] given to [place]. Wife made aware and verbalizes understanding. Protocol(s) Used: Office Note Recommended Outcome per Protocol: Information Noted and Sent to Office Reason for Outcome: Caller information to office Care Advice: ~ 04/06/2011 11:04:07AM Page 1 of 1 [email or web address]

## 2011-04-08 NOTE — Telephone Encounter (Signed)
Rx has been called in  

## 2011-04-09 ENCOUNTER — Telehealth: Payer: Self-pay | Admitting: Internal Medicine

## 2011-04-09 NOTE — Telephone Encounter (Signed)
This is a brand new problem.  I cannot diagnose it over the phone.  He will have to make an appt

## 2011-04-09 NOTE — Telephone Encounter (Signed)
[phone] Called back pt is still not feeling better. Pt having hard time walking. Pt daughter called wanted you to call ms Sedam at home

## 2011-04-09 NOTE — Telephone Encounter (Signed)
Patients wife called and stated patient went to bed Sunday night, got up Monday morning but could not walk.  He is complaining of his toe joints saying they are so painful he cant put any weight on them, she said its not his legs just his toes. Patient has not got the x ray you ordered yet because of the pain in his toes.  She wanted to know what needs to be done. Please advise.

## 2011-04-10 ENCOUNTER — Telehealth: Payer: Self-pay | Admitting: *Deleted

## 2011-04-10 NOTE — Telephone Encounter (Signed)
Patient notified. Appt scheduled.

## 2011-04-10 NOTE — Telephone Encounter (Signed)
Pt returned your call [phone]

## 2011-04-10 NOTE — Telephone Encounter (Signed)
Patient made an appt

## 2011-04-12 ENCOUNTER — Telehealth: Payer: Self-pay | Admitting: *Deleted

## 2011-04-12 ENCOUNTER — Encounter: Payer: Self-pay | Admitting: Internal Medicine

## 2011-04-12 ENCOUNTER — Ambulatory Visit (INDEPENDENT_AMBULATORY_CARE_PROVIDER_SITE_OTHER): Payer: Medicare Other | Admitting: Internal Medicine

## 2011-04-12 VITALS — BP 142/60 | HR 85 | Temp 98.4°F | Resp 16

## 2011-04-12 DIAGNOSIS — M109 Gout, unspecified: Secondary | ICD-10-CM

## 2011-04-12 DIAGNOSIS — E1165 Type 2 diabetes mellitus with hyperglycemia: Secondary | ICD-10-CM

## 2011-04-12 MED ORDER — METHYLPREDNISOLONE ACETATE PF 40 MG/ML IJ SUSP
40.0000 mg | Freq: Once | INTRAMUSCULAR | Status: AC
  Administered 2011-04-12: 40 mg via INTRAMUSCULAR

## 2011-04-12 MED ORDER — HYDROCODONE-ACETAMINOPHEN 10-325 MG PO TABS: 1.0000 | ORAL_TABLET | Freq: Four times a day (QID) | ORAL | Status: AC | PRN

## 2011-04-12 MED ORDER — BENZONATATE 200 MG PO CAPS: 200.0000 mg | ORAL_CAPSULE | Freq: Two times a day (BID) | ORAL | Status: AC | PRN

## 2011-04-12 MED ORDER — PREDNISONE (PAK) 10 MG PO TABS: ORAL_TABLET | ORAL | Status: AC

## 2011-04-12 NOTE — Patient Instructions (Addendum)
You are having   A gout flare  In both feet.  I am prescribing a course of prednisone for the inflammation.  And vicodin for the pain.  Ok to stay off your feet for a few days but move your legs around so you don't get a blood clot.   I am changing your cough medicine to  Avoid raising your blood sugars,  It is a pill called tessalon (benzonatate)     Your blood sugars will be elevated because of the prednisone so increase your insulin A as follows;  For  blood  sugar > 300,     Add 8 units to your regular dose  For blood sugar > 250,      Add 5 units to your regular dose  For blood sugar > 200,  Add 3 units to your regular dose

## 2011-04-12 NOTE — Assessment & Plan Note (Signed)
Sliding scale given for uncontrolled DM in the setting of gout flare.

## 2011-04-12 NOTE — Telephone Encounter (Signed)
Wife says that the tessalon is not covered with patient's insurance and it will cost them $90 out of pocket, wife is asking if he can get something different called in .

## 2011-04-12 NOTE — Assessment & Plan Note (Signed)
Both great toes.  Depo medrol dose given,  Prednisone taper and vicodin.

## 2011-04-12 NOTE — Telephone Encounter (Signed)
Everything else has sugar in it. He can continue using the cough medicine I prerscrbied last week

## 2011-04-12 NOTE — Progress Notes (Deleted)
  Subjective:    Patient ID: Jason Aguilar, male    DOB: 03/31/1935, 76 y.o.   MRN: [id number]  HPI     Review of Systems     Objective:   Physical Exam        Assessment & Plan:

## 2011-04-12 NOTE — Telephone Encounter (Signed)
Patient notified

## 2011-04-14 ENCOUNTER — Encounter: Payer: Self-pay | Admitting: Internal Medicine

## 2011-04-14 DIAGNOSIS — Z87891 Personal history of nicotine dependence: Secondary | ICD-10-CM | POA: Insufficient documentation

## 2011-04-14 DIAGNOSIS — E1336 Other specified diabetes mellitus with diabetic cataract: Secondary | ICD-10-CM | POA: Insufficient documentation

## 2011-04-14 DIAGNOSIS — H409 Unspecified glaucoma: Secondary | ICD-10-CM | POA: Insufficient documentation

## 2011-04-14 DIAGNOSIS — J449 Chronic obstructive pulmonary disease, unspecified: Secondary | ICD-10-CM | POA: Insufficient documentation

## 2011-04-14 NOTE — Progress Notes (Signed)
Patient ID: Jason Aguilar, male   DOB: 1935/06/22, 76 y.o.   MRN: [id number] Patient Active Problem List  Diagnoses  . 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 Hypertension  . Hyperlipidemia  . Obstructive sleep apnea of adult  . Noncompliance with medication regimen  . URI, acute  . Back pain, lumbosacral  . Diabetes mellitus type II, uncontrolled  . transitional cell, bladder  . Gout attack  . Chronic kidney disease (CKD), stage III (moderate)  . Cataract due to secondary diabetes mellitus  . Glaucoma  . Diabetes mellitus  . COPD (chronic obstructive pulmonary disease)  . History of tobacco abuse    Subjective:  CC: Bilateral foot pain    HPI:   Jason Aguilar a 76 y.o. male who presents a five-day history of severe bilateral foot pain which began on "Sunday prior to this visit. By Monday morning he was unable to bear weight on either foot due to severe pain. He denies any fevers nausea back pain or trouble urinating but has had anorexia secondary to severe pain. He has been taking Tylenol for the pain and avoiding NSAIDs as directed by his physicians. His pain is not relieved with Tylenol. He has no history of trauma. He does have a murmur history of gout with his last attack being over 3 years ago. Per his daughter despite his anorexia his blood sugars have been over 300. She has been giving him additional units of insulin but this has not helped.  Review of Systems:  Pertinent review of systems addressed in the HPI,    The following portions of the patient's history were reviewed and updated as appropriate: Allergies, current medications, and problem list.  Past Medical History  Diagnosis Date  . Glaucoma     both eyes Drs. Kylstra and Brazington  . Hematuria syndrome     s/p negative cystoscopy  . GERD (gastroesophageal reflux disease) 2003    EGD  . Allergy   . Hyperlipidemia   . Obstructive sleep apnea of adult     CPAP at 8 cm H20  . Noncompliance with  medication regimen   . 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 by [facility]: low grade noninvasive  . Chronic kidney disease (CKD), stage III (moderate)     with proteinuria  . Glaucoma   . Hypertension   . Diabetes mellitus     Type 2  . Cataract due to secondary diabetes mellitus   . COPD (chronic obstructive pulmonary disease)     secondary to tobacco abuse  . History of tobacco abuse     60"  pack year history; quit 1980   Past Surgical History  Procedure Date  . Elbow surgery   . Transurethral resection of bladder tumor Mar 13 2011    [facility]   History   Social History  . Marital Status: Married    Spouse Name: N/A    Number of Children: N/A  . Years of Education: N/A   Occupational History  . Not on file.   Social History Main Topics  . Smoking status: Former Smoker    Quit date: 10/18/1980  . Smokeless tobacco: Never Used  . Alcohol Use: 1.8 oz/week    3 Cans of beer per week  . Drug Use: No  . Sexually Active: Not on file   Other Topics Concern  . Not on file   Social History Narrative  . No narrative on file     Objective:  BP 142/60  Pulse 85  Temp(Src) 98.4 F (36.9 C) (Oral)  Resp 16  SpO2 96%  General:  Well-developed, well-nourished.  acute distress General appearance: appears older than stated age and mild distress Lungs: clear to auscultation bilaterally Chest wall: no tenderness Abdomen: soft, non-tender; bowel sounds normal; no masses,  no organomegaly Extremities: extremities normal, atraumatic, no cyanosis or edema Pulses: 2+ and symmetric Skin: Skin color, texture, turgor normal. No rashes or lesions or erythema - 1st toe(s) bilateral.  Fist MT swollen and tender t palpation bilaterally.   Assessment:  1. Gout attack  predniSONE (STERAPRED UNI-PAK) 10 MG tablet, HYDROcodone-acetaminophen (NORCO) 10-325 MG per tablet, methylPREDNISolone acetate PF (DEPO-MEDROL) injection 40 mg  2. Diabetes mellitus type II,  uncontrolled      Plan:  1. Prednisone taper and vicodin for pain 1> Sliding scale written out for patient's family to follow in anticipatio nof worsening insulin insensitivity due to prednisone.   No Follow-up on file.   Medications:   Current Outpatient Prescriptions  Medication Sig Dispense Refill  . amLODipine (NORVASC) 10 MG tablet Take 1 tablet (10 mg total) by mouth daily.  30 tablet  11  . aspirin 81 MG tablet Take 81 mg by mouth daily.        . benzonatate (TESSALON) 200 MG capsule Take 1 capsule (200 mg total) by mouth 2 (two) times daily as needed for cough.  60 capsule  1  . fexofenadine (ALLEGRA) 180 MG tablet Take 180 mg by mouth daily.        . furosemide (LASIX) 40 MG tablet Take 1 tablet (40 mg total) by mouth 2 (two) times daily.  60 tablet  5  . glipiZIDE (GLUCOTROL) 5 MG tablet Take 1 tablet (5 mg total) by mouth daily.  30 tablet  3  . guaiFENesin-codeine (CHERATUSSIN AC) 100-10 MG/5ML syrup Take 5 mLs by mouth 3 (three) times daily as needed for cough.  240 mL  0  . hydrALAZINE (APRESOLINE) 50 MG tablet Take 1 tablet (50 mg total) by mouth 3 (three) times daily.  90 tablet  5  . HYDROcodone-acetaminophen (NORCO) 10-325 MG per tablet Take 1 tablet by mouth every 6 (six) hours as needed for pain.  40 tablet  0  . insulin detemir (LEVEMIR) 100 UNIT/ML injection Inject 30 Units into the skin at bedtime.  10 mL  5  . Insulin Pen Needle 31G X 5 MM MISC Inject insulin once a day  100 each  5  . losartan (COZAAR) 100 MG tablet Take 1 tablet (100 mg total) by mouth daily.  30 tablet  5  . metFORMIN (GLUCOPHAGE) 1000 MG tablet Take 1,000 mg by mouth 2 (two) times daily with a meal.        . metoprolol (LOPRESSOR) 50 MG tablet Take 50 mg by mouth 2 (two) times daily.        Marland Kitchen MICROLET LANCETS MISC Patient test blood sugar twice daily  100 each  5  . omeprazole (PRILOSEC) 20 MG capsule TAKE ONE (1) CAPSULE EACH DAY  30 capsule  3  . predniSONE (STERAPRED UNI-PAK) 10 MG tablet  6 tablets on day 1, decrease by tablet daily until gone  21 tablet  0  . simvastatin (ZOCOR) 40 MG tablet TAKE ONE TABLET DAILY AT BEDTIME  30 tablet  3  . travoprost, benzalkonium, (TRAVATAN) 0.004 % ophthalmic solution One drop both eyes at bedtime.

## 2011-04-14 NOTE — Progress Notes (Deleted)
Patient ID: Jason Aguilar, male   DOB: Sep 17, 1935, 75 y.o.   MRN: [id number] Her

## 2011-04-14 NOTE — Progress Notes (Deleted)
Patient ID: Jason Aguilar, male   DOB: [birth year], 76 y.o.   MRN: [id number]  Patient Active Problem List  Diagnoses  . 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 Hypertension  . Hyperlipidemia  . Obstructive sleep apnea of adult  . Noncompliance with medication regimen  . URI, acute  . Back pain, lumbosacral  . Diabetes mellitus type II, uncontrolled  . transitional cell, bladder  . Gout attack    Subjective:  CC: foot pain    HPI:   Jason Aguilar a 76 yo male who presents with a five-day history of bilateral foot pain which is so severe he's been unable to walk or bear weight since the onset of pain. He denies any recent trauma he denies fevers nausea and pain in other joints excluding his right knee which is chronic. He has a history of gout but has not had an attack in over 3 years. He has not started any new medications. He does not drink alcohol. Incidentally his blood sugars have been running in the 300 range since his last visit one week ago. At that visit he was given a prescription for cough medicine and his family wonders whether his sugars are elevated secondary to use of that medication.  He has not tried any medications to relieve the pain since he has a history of renal disease and was told not to use nonsteroidal anti-inflammatories. He has no narcotics at home. He denies back pain headaches and altered mental status. He is having trouble sleeping secondary to pain.  Review of Systems:  Pertinent review of systems addressed in the HPI,    The following portions of the patient's history were reviewed and updated as appropriate: Allergies, current medications, and problem list.  History   Social History Narrative  . No narrative on file    Objective:  BP 142/60  Pulse 85  Temp(Src) 98.4 F (36.9 C) (Oral)  Resp 16  SpO2 96%  General:  Well-developed, well-nourished   Assessment:  1. Gout attack  predniSONE (STERAPRED UNI-PAK) 10 MG tablet,  HYDROcodone-acetaminophen (NORCO) 10-325 MG per tablet, methylPREDNISolone acetate PF (DEPO-MEDROL) injection 40 mg  2. Diabetes mellitus type II, uncontrolled      Plan:  No orders of the defined types were placed in this encounter.    No Follow-up on file.   Medications:   Current Outpatient Prescriptions  Medication Sig Dispense Refill  . amLODipine (NORVASC) 10 MG tablet Take 1 tablet (10 mg total) by mouth daily.  30 tablet  11  . aspirin 81 MG tablet Take 81 mg by mouth daily.        . benzonatate (TESSALON) 200 MG capsule Take 1 capsule (200 mg total) by mouth 2 (two) times daily as needed for cough.  60 capsule  1  . fexofenadine (ALLEGRA) 180 MG tablet Take 180 mg by mouth daily.        . furosemide (LASIX) 40 MG tablet Take 1 tablet (40 mg total) by mouth 2 (two) times daily.  60 tablet  5  . glipiZIDE (GLUCOTROL) 5 MG tablet Take 1 tablet (5 mg total) by mouth daily.  30 tablet  3  . guaiFENesin-codeine (CHERATUSSIN AC) 100-10 MG/5ML syrup Take 5 mLs by mouth 3 (three) times daily as needed for cough.  240 mL  0  . hydrALAZINE (APRESOLINE) 50 MG tablet Take 1 tablet (50 mg total) by mouth 3 (three) times daily.  90 tablet  5  . HYDROcodone-acetaminophen (NORCO) 10-325 MG  per tablet Take 1 tablet by mouth every 6 (six) hours as needed for pain.  40 tablet  0  . insulin detemir (LEVEMIR) 100 UNIT/ML injection Inject 30 Units into the skin at bedtime.  10 mL  5  . Insulin Pen Needle 31G X 5 MM MISC Inject insulin once a day  100 each  5  . losartan (COZAAR) 100 MG tablet Take 1 tablet (100 mg total) by mouth daily.  30 tablet  5  . metFORMIN (GLUCOPHAGE) 1000 MG tablet Take 1,000 mg by mouth 2 (two) times daily with a meal.        . metoprolol (LOPRESSOR) 50 MG tablet Take 50 mg by mouth 2 (two) times daily.        Marland Kitchen MICROLET LANCETS MISC Patient test blood sugar twice daily  100 each  5  . omeprazole (PRILOSEC) 20 MG capsule TAKE ONE (1) CAPSULE EACH DAY  30 capsule  3  .  predniSONE (STERAPRED UNI-PAK) 10 MG tablet 6 tablets on day 1, decrease by tablet daily until gone  21 tablet  0  . simvastatin (ZOCOR) 40 MG tablet TAKE ONE TABLET DAILY AT BEDTIME  30 tablet  3  . travoprost, benzalkonium, (TRAVATAN) 0.004 % ophthalmic solution One drop both eyes at bedtime.

## 2011-04-17 ENCOUNTER — Ambulatory Visit (INDEPENDENT_AMBULATORY_CARE_PROVIDER_SITE_OTHER)
Admission: RE | Admit: 2011-04-17 | Discharge: 2011-04-17 | Disposition: A | Discharge status: Home / Self Care | Payer: Medicare Other | Source: Ambulatory Visit | Attending: Internal Medicine | Admitting: Internal Medicine

## 2011-04-17 DIAGNOSIS — M545 Low back pain, unspecified: Secondary | ICD-10-CM

## 2011-04-18 ENCOUNTER — Ambulatory Visit: Payer: Medicare Other | Admitting: Endocrinology

## 2011-04-19 ENCOUNTER — Other Ambulatory Visit: Payer: Self-pay | Admitting: Internal Medicine

## 2011-04-19 DIAGNOSIS — M5430 Sciatica, unspecified side: Secondary | ICD-10-CM

## 2011-04-19 NOTE — Progress Notes (Signed)
I mean [facility]

## 2011-04-19 NOTE — Progress Notes (Signed)
MRi ordered for [facility]

## 2011-04-23 ENCOUNTER — Other Ambulatory Visit: Payer: Self-pay | Admitting: Internal Medicine

## 2011-04-23 DIAGNOSIS — M545 Low back pain: Secondary | ICD-10-CM

## 2011-04-29 ENCOUNTER — Ambulatory Visit: Payer: Self-pay | Admitting: Internal Medicine

## 2011-04-30 ENCOUNTER — Telehealth: Payer: Self-pay | Admitting: Internal Medicine

## 2011-04-30 DIAGNOSIS — M5430 Sciatica, unspecified side: Secondary | ICD-10-CM

## 2011-04-30 NOTE — Telephone Encounter (Signed)
I tried calling patient but line was busy, I will try again later.

## 2011-04-30 NOTE — Telephone Encounter (Signed)
His MRI showed pretty severe disk diease at L5.  He should see a neurosurgeon.  There are none in [place],  I recommend [facility], adn am setting up referral.

## 2011-05-06 ENCOUNTER — Ambulatory Visit: Payer: Medicare Other | Admitting: Endocrinology

## 2011-05-06 NOTE — Telephone Encounter (Signed)
Notified patients daughter of results.  She stated he has an appt with [facility] 4/10, she stated he doesn't want to go to [place] and I tried to explain several times that there are no neurosurgeons in [place].  His daughter kept saying "there are plenty of surgeons in [place]".  Before hanging up I advised that he did need to make that appt this week.

## 2011-05-07 ENCOUNTER — Encounter: Payer: Self-pay | Admitting: Internal Medicine

## 2011-05-09 ENCOUNTER — Other Ambulatory Visit: Payer: Self-pay | Admitting: Internal Medicine

## 2011-05-21 ENCOUNTER — Telehealth: Payer: Self-pay | Admitting: Internal Medicine

## 2011-05-21 MED ORDER — CEPHALEXIN 500 MG PO TABS: 500.0000 mg | ORAL_TABLET | Freq: Three times a day (TID) | ORAL | Status: AC

## 2011-05-21 NOTE — Telephone Encounter (Signed)
I called patients wife she stated patient is afebrile, denies SOB, and is feeling fine, she just wanted to know if there was anything she should put on the spot or if patient should be on an antibiotic.  She did keep the tick, it is brown and flat per [place].  Please advise.

## 2011-05-21 NOTE — Telephone Encounter (Signed)
Io sent an rx to his pharmacy to for an antibiotic three times daily for one week .  If he develops fever, headache or rash call us immediatley.

## 2011-05-21 NOTE — Telephone Encounter (Signed)
[phone] Pt spouse called she pulled tick of Jason Aguilar 2am yesterday morning.  She circled the area where the tick was.  Now the place is red around where the tick came off,and has welp   Is there anything she can put on this medicap

## 2011-05-22 NOTE — Telephone Encounter (Signed)
I tried calling patients wife, no voicemail has been set up.  I will try again.

## 2011-05-23 ENCOUNTER — Other Ambulatory Visit: Payer: Self-pay | Admitting: Internal Medicine

## 2011-05-23 ENCOUNTER — Telehealth: Payer: Self-pay | Admitting: Internal Medicine

## 2011-05-23 DIAGNOSIS — W57XXXA Bitten or stung by nonvenomous insect and other nonvenomous arthropods, initial encounter: Secondary | ICD-10-CM

## 2011-05-23 MED ORDER — DOXYCYCLINE HYCLATE 100 MG PO TABS: 100.0000 mg | ORAL_TABLET | Freq: Two times a day (BID) | ORAL | Status: AC

## 2011-05-23 NOTE — Telephone Encounter (Signed)
Pt wife called back.  Let the phone ring at least 6 times

## 2011-05-23 NOTE — Telephone Encounter (Signed)
Patients wife notified, per Dr. Darrick Huntsman stop the Keflex and start the Doxycycline that was called in today because patient developed a headache this morning.

## 2011-05-23 NOTE — Telephone Encounter (Signed)
I tried calling again

## 2011-07-01 ENCOUNTER — Other Ambulatory Visit: Payer: Self-pay | Admitting: Internal Medicine

## 2011-07-01 ENCOUNTER — Telehealth: Payer: Self-pay | Admitting: Internal Medicine

## 2011-07-01 MED ORDER — DOXYCYCLINE HYCLATE 100 MG PO TABS: 100.0000 mg | ORAL_TABLET | Freq: Two times a day (BID) | ORAL | Status: AC

## 2011-07-01 NOTE — Telephone Encounter (Signed)
Caller: [name]/Spouse; PCP: Duncan Dull; CB#: [phone]; ; ; Call regarding Tick Bite Complications; Removed tick from pt lower right side of back approx 5 weeks ago ? 05/20/11. Was seen in office and placed on Doxicycline. Looks like its "festered up around it and is red with a black scab in the middle about the size of a quarter". Has been using Neosporin. Afebrile. Pt states that he has no energy and is tired. Has pain to bends of legs. All emergent sxs per Bites and Stings protocol r/o. Home care advice given. See within 4 hours disp. Spoke with Jennifer/Office, who spoke with Dr. Darrick Huntsman. Dr. Darrick Huntsman will call in another round of Doxicycline for pt to [facility] pharmacy on file.

## 2011-07-04 ENCOUNTER — Encounter: Payer: Self-pay | Admitting: Endocrinology

## 2011-07-04 ENCOUNTER — Ambulatory Visit (INDEPENDENT_AMBULATORY_CARE_PROVIDER_SITE_OTHER): Payer: Medicare Other | Admitting: Endocrinology

## 2011-07-04 VITALS — BP 148/62 | HR 54 | Temp 97.5°F | Ht 65.0 in | Wt 161.0 lb

## 2011-07-04 DIAGNOSIS — E119 Type 2 diabetes mellitus without complications: Secondary | ICD-10-CM

## 2011-07-04 DIAGNOSIS — E278 Other specified disorders of adrenal gland: Secondary | ICD-10-CM

## 2011-07-04 DIAGNOSIS — I10 Essential (primary) hypertension: Secondary | ICD-10-CM

## 2011-07-04 MED ORDER — DEXAMETHASONE 1 MG PO TABS: 1.0000 mg | ORAL_TABLET | Freq: Every day | ORAL | Status: AC

## 2011-07-04 NOTE — Patient Instructions (Signed)
you should do a "dexamethasone suppression test."  for this, you would take dexamethasone 1 mg at 10 pm, then come in for a "cortisol" blood test the next morning before 9 am.  you do not need to be fasting for this test. Also, let's check a 24-hr urine test for "adrenaline." You will receive a letter with each of your test results. If these are ok, you don't need any more testing for the "lumpy adrenal glands."

## 2011-07-04 NOTE — Progress Notes (Signed)
Subjective:    Patient ID: Jason Aguilar, male    DOB: 1935-09-13, 76 y.o.   MRN: [id number]  HPI Pt was noted in 2009 on CT, apparently incidentally, to have small bilat adrenal nodules.  A repeat in 2012 was unchanged.  He reports many years of intermittent headache, throughout the head, but no assoc facial flushing.   Past Medical History  Diagnosis Date  . Glaucoma     both eyes Drs. Melinda Crutch and Brazington  . Hematuria syndrome     s/p negative cystoscopy  . GERD (gastroesophageal reflux disease) 2003    EGD  . Allergy   . Hyperlipidemia   . Obstructive sleep apnea of adult     CPAP at 8 cm H20  . Noncompliance with medication regimen   . 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 by [facility]: low grade noninvasive  . Chronic kidney disease (CKD), stage III (moderate)     with proteinuria  . Glaucoma   . Hypertension   . Diabetes mellitus     Type 2  . Cataract due to secondary diabetes mellitus   . COPD (chronic obstructive pulmonary disease)     secondary to tobacco abuse  . History of tobacco abuse     60 pack year history; quit 1980    Past Surgical History  Procedure Date  . Elbow surgery   . Transurethral resection of bladder tumor Mar 13 2011    [facility]    History   Social History  . Marital Status: Married    Spouse Name: N/A    Number of Children: N/A  . Years of Education: N/A   Occupational History  . Not on file.   Social History Main Topics  . Smoking status: Former Smoker    Quit date: 10/18/1980  . Smokeless tobacco: Never Used  . Alcohol Use: 1.8 oz/week    3 Cans of beer per week  . Drug Use: No  . Sexually Active: Not on file   Other Topics Concern  . Not on file   Social History Narrative  . No narrative on file    Current Outpatient Prescriptions on File Prior to Visit  Medication Sig Dispense Refill  . amLODipine (NORVASC) 10 MG tablet Take 1 tablet (10 mg total) by mouth daily.  30 tablet  11  . aspirin 81 MG tablet  Take 81 mg by mouth daily.        Marland Kitchen doxycycline (VIBRA-TABS) 100 MG tablet Take 1 tablet (100 mg total) by mouth 2 (two) times daily.  14 tablet  0  . fexofenadine (ALLEGRA) 180 MG tablet Take 180 mg by mouth daily.        . furosemide (LASIX) 40 MG tablet Take 1 tablet (40 mg total) by mouth 2 (two) times daily.  60 tablet  5  . glipiZIDE (GLUCOTROL) 5 MG tablet Take 1 tablet (5 mg total) by mouth daily.  30 tablet  3  . hydrALAZINE (APRESOLINE) 50 MG tablet Take 1 tablet (50 mg total) by mouth 3 (three) times daily.  90 tablet  5  . insulin detemir (LEVEMIR) 100 UNIT/ML injection Inject 30 Units into the skin at bedtime.  10 mL  5  . Insulin Pen Needle 31G X 5 MM MISC Inject insulin once a day  100 each  5  . losartan (COZAAR) 100 MG tablet TAKE ONE (1) TABLET EACH DAY  30 tablet  5  . metFORMIN (GLUCOPHAGE) 1000 MG tablet Take  1,000 mg by mouth 2 (two) times daily with a meal.        . metoprolol (LOPRESSOR) 50 MG tablet Take 50 mg by mouth 2 (two) times daily.        Marland Kitchen MICROLET LANCETS MISC Patient test blood sugar twice daily  100 each  5  . omeprazole (PRILOSEC) 20 MG capsule TAKE ONE (1) CAPSULE EACH DAY  30 capsule  3  . simvastatin (ZOCOR) 40 MG tablet TAKE ONE TABLET DAILY AT BEDTIME  30 tablet  3  . travoprost, benzalkonium, (TRAVATAN) 0.004 % ophthalmic solution One drop both eyes at bedtime.        Allergies  Allergen Reactions  . Dristan     Family History  Problem Relation Age of Onset  . Diabetes Mother   . Heart disease Father   no adrenal probs  There were no vitals taken for this visit.  Review of Systems denies weight gain, excessive diaphoresis, polyuria, sob, insomnia, hyperpigmentation, cramps, numbness, easy bruising, muscle weakness, depression, and rash on the abdomen, pallor, n/v, syncope, diarrhea, chest pain, visual loss, palpitations, fever, arthralgias, and rhinorrhea.    Objective:   Physical Exam VS: see vs page GEN: no distress HEAD: head: no  deformity eyes: no periorbital swelling, no proptosis external nose and ears are normal mouth: no lesion seen NECK: supple, thyroid is not enlarged.   CHEST WALL: no deformity LUNGS: clear to auscultation BREASTS:  No gynecomastia CV: reg rate and rhythm, no murmur ABD: abdomen is soft, nontender.  no hepatosplenomegaly.  not distended.  no hernia.  No striae. MUSCULOSKELETAL: muscle bulk and strength are grossly normal.  no obvious joint swelling.  gait is normal and steady. EXTEMITIES: no deformity of the hands. no edema of the legs. NEURO:  cn 2-12 grossly intact.   readily moves all 4's.  sensation is intact to touch on the feet SKIN:  Normal texture and temperature.  No rash or suspicious lesion is visible.   NODES:  None palpable at the neck.   PSYCH: alert, oriented x3.  Does not appear anxious nor depressed.     Assessment & Plan:  bilat adrenal nodules.  Malignancy is exceedingly unlikely, due to stability on ct. HTN.  She should be checked for pheochromocytoma, despite this being an unlikely appearance on CT for this condition.   DM.  She should be checked for cushing's, but this is unlikely.

## 2011-07-05 ENCOUNTER — Other Ambulatory Visit (INDEPENDENT_AMBULATORY_CARE_PROVIDER_SITE_OTHER): Payer: Medicare Other | Admitting: *Deleted

## 2011-07-05 DIAGNOSIS — Z79899 Other long term (current) drug therapy: Secondary | ICD-10-CM

## 2011-07-09 ENCOUNTER — Other Ambulatory Visit: Payer: Medicare Other

## 2011-07-10 ENCOUNTER — Telehealth: Payer: Self-pay | Admitting: Internal Medicine

## 2011-07-10 NOTE — Telephone Encounter (Signed)
Is it ok for the patient to take fish oil?

## 2011-07-12 NOTE — Telephone Encounter (Signed)
Yes, patient can take fish oil 1000 mg daily

## 2011-07-12 NOTE — Telephone Encounter (Signed)
Tried calling patient, but was unable to reach him. No way to leave message. Will try again later.

## 2011-07-13 LAB — METANEPHRINES, URINE, 24 HOUR
Metanephrines, Ur: 52 mcg/24 h — ABNORMAL LOW (ref 90–315)
Normetanephrine, 24H Ur: 314 mcg/24 h (ref 122–676)

## 2011-07-14 LAB — CATECHOLAMINES, FRACTIONATED, URINE, 24 HOUR
Creatinine, Urine mg/day-CATEUR: 0.97 g/(24.h) (ref 0.63–2.50)
Dopamine, 24 hr Urine: 29 mcg/24 h — ABNORMAL LOW (ref 52–480)
Norepinephrine, 24 hr Ur: 14 mcg/24 h — ABNORMAL LOW (ref 15–100)
Total Volume - CF 24Hr U: 1300 mL

## 2011-07-15 NOTE — Telephone Encounter (Signed)
Patient notified

## 2011-07-16 ENCOUNTER — Encounter: Payer: Self-pay | Admitting: Endocrinology

## 2011-07-16 ENCOUNTER — Telehealth: Payer: Self-pay | Admitting: *Deleted

## 2011-07-16 NOTE — Telephone Encounter (Signed)
Called pt to inform of lab results, pt's spouse informed of lab results (letter also mailed to pt)  

## 2011-07-18 ENCOUNTER — Telehealth: Payer: Self-pay | Admitting: Internal Medicine

## 2011-07-18 NOTE — Telephone Encounter (Signed)
Caller: [name]/Mother; PCP: Duncan Dull; CB#: [phone]; ; ; Call regarding tick bite on right part of back towards his side.  Center of it is black, red and puffy "festered up" all around it.  States he has been on 2 rounds of antibiotics, last was Doxycycline 2 weeks ago.  Still concerned about the site.   Headache for a few days, seems more tired than usual.  Afebrile.  Denies pain, c/o itching.  Per protocol reached See Provider within 4 hours.  Please call patient and advise if he should see UC or wait until tomorrow, wife states he needs an appt for A1C check anyway.

## 2011-07-19 NOTE — Telephone Encounter (Signed)
Patient notified

## 2011-07-19 NOTE — Telephone Encounter (Signed)
Patient notified to go to UC

## 2011-08-20 ENCOUNTER — Ambulatory Visit: Payer: Medicare Other | Admitting: Internal Medicine

## 2011-08-23 ENCOUNTER — Encounter: Payer: Self-pay | Admitting: Internal Medicine

## 2011-08-23 ENCOUNTER — Ambulatory Visit (INDEPENDENT_AMBULATORY_CARE_PROVIDER_SITE_OTHER): Payer: Medicare Other | Admitting: Internal Medicine

## 2011-08-23 VITALS — BP 138/50 | HR 73 | Temp 98.3°F | Resp 18 | Wt 164.0 lb

## 2011-08-23 DIAGNOSIS — E278 Other specified disorders of adrenal gland: Secondary | ICD-10-CM

## 2011-08-23 DIAGNOSIS — E785 Hyperlipidemia, unspecified: Secondary | ICD-10-CM

## 2011-08-23 DIAGNOSIS — N058 Unspecified nephritic syndrome with other morphologic changes: Secondary | ICD-10-CM

## 2011-08-23 DIAGNOSIS — E1165 Type 2 diabetes mellitus with hyperglycemia: Secondary | ICD-10-CM

## 2011-08-23 DIAGNOSIS — E1329 Other specified diabetes mellitus with other diabetic kidney complication: Secondary | ICD-10-CM

## 2011-08-23 DIAGNOSIS — E1321 Other specified diabetes mellitus with diabetic nephropathy: Secondary | ICD-10-CM

## 2011-08-23 DIAGNOSIS — C801 Malignant (primary) neoplasm, unspecified: Secondary | ICD-10-CM

## 2011-08-23 DIAGNOSIS — I10 Essential (primary) hypertension: Secondary | ICD-10-CM

## 2011-08-23 LAB — COMPREHENSIVE METABOLIC PANEL
ALT: 21 U/L (ref 0–53)
Alkaline Phosphatase: 66 U/L (ref 39–117)
Creatinine, Ser: 2.4 mg/dL — ABNORMAL HIGH (ref 0.4–1.5)
GFR: 28.01 mL/min — ABNORMAL LOW (ref >60.00)
Glucose, Bld: 167 mg/dL — ABNORMAL HIGH (ref 70–99)
Sodium: 140 mEq/L (ref 135–145)
Total Bilirubin: 0.6 mg/dL (ref 0.3–1.2)
Total Protein: 7.4 g/dL (ref 6.0–8.3)

## 2011-08-23 LAB — HEMOGLOBIN A1C: Hgb A1c MFr Bld: 8 % — ABNORMAL HIGH (ref 4.6–6.5)

## 2011-08-23 MED ORDER — INSULIN DETEMIR 100 UNIT/ML ~~LOC~~ SOLN: 30.0000 [IU] | Freq: Every day | SUBCUTANEOUS | Status: DC

## 2011-08-23 MED ORDER — INSULIN DETEMIR 100 UNIT/ML ~~LOC~~ SOLN: 40.0000 [IU] | Freq: Every day | SUBCUTANEOUS | Status: DC

## 2011-08-23 NOTE — Assessment & Plan Note (Signed)
3  Month follow up on bladder scan by [facility] and [place] u[facility] was clear

## 2011-08-23 NOTE — Patient Instructions (Addendum)
We may need to change  Or add a different insulin depending on your h bA1c  This time.  Increase Levemir  To 35 units daily   Keep the new insulin (orage box  novolog ) in the refrigerator and do not use until yo hear from me.

## 2011-08-23 NOTE — Progress Notes (Signed)
Patient ID: Jason Aguilar, male   DOB: 21-Jun-1935, 76 y.o.   MRN: [id number]  Patient Active Problem List  Diagnosis  . 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 Hypertension  . Hyperlipidemia  . Obstructive sleep apnea of adult  . Noncompliance with medication regimen  . Back pain, lumbosacral  . Diabetes mellitus type II, uncontrolled  . transitional cell, bladder  . Gout attack  . Chronic kidney disease (CKD), stage III (moderate)  . Cataract due to secondary diabetes mellitus  . Glaucoma  . COPD (chronic obstructive pulmonary disease)  . History of tobacco abuse  . Adrenal nodule    Subjective:  CC:   Chief Complaint  Patient presents with  . Follow-up    HPI:   Jason Aguilar a 76 y.o. male who presents  Past Medical History  Diagnosis Date  . Glaucoma     both eyes Drs. Melinda Crutch and Brazington  . Hematuria syndrome     s/p negative cystoscopy  . GERD (gastroesophageal reflux disease) 2003    EGD  . Allergy   . Hyperlipidemia   . Obstructive sleep apnea of adult     CPAP at 8 cm H20  . Noncompliance with medication regimen   . 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 by [facility]: low grade noninvasive  . Chronic kidney disease (CKD), stage III (moderate)     with proteinuria  . Glaucoma   . Hypertension   . Diabetes mellitus     Type 2  . Cataract due to secondary diabetes mellitus   . COPD (chronic obstructive pulmonary disease)     secondary to tobacco abuse  . History of tobacco abuse     60 pack year history; quit 1980    Past Surgical History  Procedure Date  . Elbow surgery   . Transurethral resection of bladder tumor Mar 13 2011    [facility]         The following portions of the patient's history were reviewed and updated as appropriate: Allergies, current medications, and problem list.    Review of Systems:  Review of Systems  Constitutional: Negative.   HENT: Negative.   Eyes: Negative.   Respiratory: Negative.     Cardiovascular: Negative.   Gastrointestinal: Negative.   Genitourinary: Negative.   Musculoskeletal: Positive for back pain.  Skin: Negative.   Neurological: Negative.   Endo/Heme/Allergies: Negative.   Psychiatric/Behavioral: Negative.         History   Social History  . Marital Status: Married    Spouse Name: N/A    Number of Children: N/A  . Years of Education: N/A   Occupational History  . Not on file.   Social History Main Topics  . Smoking status: Former Smoker    Quit date: 10/18/1980  . Smokeless tobacco: Never Used  . Alcohol Use: 1.8 oz/week    3 Cans of beer per week  . Drug Use: No  . Sexually Active: Not on file   Other Topics Concern  . Not on file   Social History Narrative  . No narrative on file    Objective:  BP 138/50  Pulse 73  Temp 98.3 F (36.8 C) (Oral)  Resp 18  Wt 164 lb (74.39 kg)  SpO2 95%  General appearance: alert, cooperative and appears stated age Ears: normal TM's and external ear canals both ears Throat: lips, mucosa, and tongue normal; teeth and gums normal Neck: no adenopathy, no carotid bruit, supple, symmetrical,  trachea midline and thyroid not enlarged, symmetric, no tenderness/mass/nodules Back: symmetric, no curvature. ROM normal. No CVA tenderness. Lungs: clear to auscultation bilaterally Heart: regular rate and rhythm, S1, S2 normal, no murmur, click, rub or gallop Abdomen: soft, non-tender; bowel sounds normal; no masses,  no organomegaly Pulses: 2+ and symmetric Skin: Skin color, texture, turgor normal. No rashes or lesions Lymph nodes: Cervical, supraclavicular, and axillary nodes normal.  Assessment and Plan:  transitional cell, bladder 3  Month follow up on bladder scan by [facility] and [place] [facility] was clear   Hypertension On 5 drug therapy with no evidence of RAS by prior workup.  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   Diabetes mellitus type II, uncontrolled hgba1c is actually higher today than in March at 8.0.  I will add short acting insulin before lunch and dinner for imporved glycemic control.   Adrenal nodule First seen in 2009, unchanged, with no symptoms suggestive of pheochromcytoma.  24 hour urine collection for catecholamines was negative.   Hyperlipidemia Managed with zocor.  LFTs normal,  LDL was ordered but not resulted, he will return for fasting lipids,  Goal is 70   Updated Medication List Outpatient Encounter Prescriptions as of 08/23/2011  Medication Sig Dispense Refill  . amLODipine (NORVASC) 10 MG tablet Take 1 tablet (10 mg total) by mouth daily.  30 tablet  11  . aspirin 81 MG tablet Take 81 mg by mouth daily.        Marland Kitchen doxazosin (CARDURA) 4 MG tablet Take 4 mg by mouth daily.      . furosemide (LASIX) 40 MG tablet Take 1 tablet (40 mg total) by mouth 2 (two) times daily.  60 tablet  5  . glipiZIDE (GLUCOTROL) 5 MG tablet Take 1 tablet (5 mg total) by mouth daily.  30 tablet  3  . hydrALAZINE (APRESOLINE) 50 MG tablet Take 1 tablet (50 mg total) by mouth 3 (three) times daily.  90 tablet  5  . insulin detemir (LEVEMIR) 100 UNIT/ML injection Inject 40 Units into the skin at bedtime.  3 mL  6  . Insulin Pen Needle 31G X 5 MM MISC Inject insulin once a day  100 each  5  . latanoprost (XALATAN) 0.005 % ophthalmic solution Apply 1 drop to eye at bedtime.      Marland Kitchen losartan (COZAAR) 100 MG tablet TAKE ONE (1) TABLET EACH DAY  30 tablet  5  . metoprolol (LOPRESSOR) 50 MG tablet Take 50 mg by mouth 2 (two) times daily.        Marland Kitchen MICROLET LANCETS MISC Patient test blood sugar twice daily  100 each  5  . omeprazole (PRILOSEC) 20 MG capsule TAKE ONE (1) CAPSULE EACH DAY  30 capsule  3  . simvastatin (ZOCOR) 40 MG tablet TAKE ONE TABLET DAILY AT BEDTIME  30 tablet  3  . travoprost, benzalkonium, (TRAVATAN) 0.004 % ophthalmic  solution One drop both eyes at bedtime.      Marland Kitchen DISCONTD: insulin detemir (LEVEMIR) 100 UNIT/ML injection Inject 30 Units into the skin at bedtime.  10 mL  5  . DISCONTD: insulin detemir (LEVEMIR) 100 UNIT/ML injection Inject 30 Units into the skin at bedtime.  3 mL  6  . insulin aspart (NOVOLOG) 100 UNIT/ML injection 5 units for cbg 150 but less than 200 8 units for cbg > 200 but less than 240 10 units  for cbg > 240 but less than 280 12 units for cbg > 280 but less than 300  3 mL  12  . DISCONTD: metFORMIN (GLUCOPHAGE) 1000 MG tablet Take 1,000 mg by mouth 2 (two) times daily with a meal.           Orders Placed This Encounter  Procedures  . Hemoglobin A1c  . LDL cholesterol, direct  . Comprehensive metabolic panel    Return in about 3 months (around 11/23/2011).

## 2011-08-24 ENCOUNTER — Encounter: Payer: Self-pay | Admitting: Internal Medicine

## 2011-08-24 MED ORDER — INSULIN ASPART 100 UNIT/ML ~~LOC~~ SOLN: SUBCUTANEOUS | Status: DC

## 2011-08-24 NOTE — Assessment & Plan Note (Signed)
hgba1c is actually higher today than in March at 8.0.  I will add short acting insulin before lunch and dinner for imporved glycemic control.

## 2011-08-24 NOTE — Assessment & Plan Note (Signed)
On 5 drug therapy with no evidence of RAS by prior workup.  No changes today

## 2011-08-24 NOTE — Assessment & Plan Note (Signed)
With significant proteinuria , secondary hyperparathyroidism, with Nephrology and endocrinology consulted.

## 2011-08-25 NOTE — Assessment & Plan Note (Signed)
First seen in 2009, unchanged, with no symptoms suggestive of pheochromcytoma.  24 hour urine collection for catecholamines was negative.

## 2011-08-25 NOTE — Assessment & Plan Note (Signed)
Managed with zocor.  LFTs normal,  LDL was ordered but not resulted, he will return for fasting lipids,  Goal is 72

## 2011-08-30 ENCOUNTER — Telehealth: Payer: Self-pay | Admitting: Internal Medicine

## 2011-08-30 DIAGNOSIS — R079 Chest pain, unspecified: Secondary | ICD-10-CM

## 2011-08-30 NOTE — Telephone Encounter (Signed)
Caller: [name]/Spouse; PCP: Duncan Dull; CB#: [phone];  Call regarding Soreness in Chest, After Hitting Himself with a piece of the car he was fixing on Monday 08/26/11.  Spoke with Zella Ball in the office and made an appt for Monday at 2pm, wanted to triage to make sure he could wait that long.  Wife states he is sore on his right chest wall, can pinpoint the exact spot.  Hurts to move or take a deep breath, states she has seen him holding his chest a few times.  All emergent symptoms ruled out per "Chest Injury" protocol with the exception of "Chest trauma resulting in tenderness over a specific point on chest."  Advised to See Provider within 4 hours, not to wait until Monday.  Wife states she has already tried to get him to go and he will not.  Advised Urgent Care or ED for evaluation of symptoms, wife states that they will try and wait until Monday but if symptoms worsen they will go in to ED for evaluation at [facility].

## 2011-09-02 ENCOUNTER — Ambulatory Visit (INDEPENDENT_AMBULATORY_CARE_PROVIDER_SITE_OTHER): Payer: Medicare Other | Admitting: Internal Medicine

## 2011-09-02 ENCOUNTER — Encounter: Payer: Self-pay | Admitting: Internal Medicine

## 2011-09-02 ENCOUNTER — Other Ambulatory Visit: Payer: Self-pay | Admitting: *Deleted

## 2011-09-02 VITALS — BP 136/54 | HR 60 | Temp 98.0°F | Resp 18 | Wt 169.5 lb

## 2011-09-02 DIAGNOSIS — M545 Low back pain: Secondary | ICD-10-CM

## 2011-09-02 DIAGNOSIS — R0789 Other chest pain: Secondary | ICD-10-CM | POA: Insufficient documentation

## 2011-09-02 MED ORDER — SIMVASTATIN 40 MG PO TABS: 40.0000 mg | ORAL_TABLET | Freq: Every day | ORAL | Status: DC

## 2011-09-02 MED ORDER — TRAMADOL HCL 50 MG PO TABS: 50.0000 mg | ORAL_TABLET | Freq: Four times a day (QID) | ORAL | Status: AC | PRN

## 2011-09-02 NOTE — Progress Notes (Signed)
Patient ID: Jason Aguilar, male   DOB: Mar 20, 1935, 76 y.o.   MRN: [id number]   Patient Active Problem List  Diagnosis  . 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 Hypertension  . Hyperlipidemia  . Obstructive sleep apnea of adult  . Noncompliance with medication regimen  . Back pain, lumbosacral  . Diabetes mellitus type II, uncontrolled  . transitional cell, bladder  . Gout attack  . Chronic kidney disease (CKD), stage III (moderate)  . Cataract due to secondary diabetes mellitus  . Glaucoma  . COPD (chronic obstructive pulmonary disease)  . History of tobacco abuse  . Adrenal nodule  . Chest pain, localized    Subjective:  CC:   Chief Complaint  Patient presents with  . Chest Injury    HPI:   Jason Cookis a 76 y.o. male who presents with a four-day history of right-sided chest pain which occurred after banging his chest with a carport to elicit sig. The blunt trauma occurred while he was inside the car removing a part of the dashboard. Family notices that for the last several days he has been holding his his rib cage when walking or moving. Wincing. Patient denies any severe pain. There was no history of bruising at the time of the incident. He has no chest pain with deep breathing and is not short of breath. Simultaneous uses chronic back pain which radiates to both legs. He cannot take nonsteroidal anti-inflammatories because of his chronic kidney disease and is requesting something for pain management. Is also requesting a back brace that he may uses when he is working on cars.  Past Medical History  Diagnosis Date  . Glaucoma     both eyes Drs. Melinda Crutch and [place]  . Hematuria syndrome     s/p negative cystoscopy  . GERD (gastroesophageal reflux disease) 2003    EGD  . Allergy   . Hyperlipidemia   . Obstructive sleep apnea of adult     CPAP at 8 cm H20  . Noncompliance with medication regimen   . 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 by  [facility]: low grade noninvasive  . Chronic kidney disease (CKD), stage III (moderate)     with proteinuria  . Glaucoma   . Hypertension   . Diabetes mellitus     Type 2  . Cataract due to secondary diabetes mellitus   . COPD (chronic obstructive pulmonary disease)     secondary to tobacco abuse  . History of tobacco abuse     60 pack year history; quit 1980    Past Surgical History  Procedure Date  . Elbow surgery   . Transurethral resection of bladder tumor Mar 13 2011    [facility]         The following portions of the patient's history were reviewed and updated as appropriate: Allergies, current medications, and problem list.    Review of Systems:  Chest pain, back pain.    review of systems was negative except those addressed in the HPI,     History   Social History  . Marital Status: Married    Spouse Name: N/A    Number of Children: N/A  . Years of Education: N/A   Occupational History  . Not on file.   Social History Main Topics  . Smoking status: Former Smoker    Quit date: 10/18/1980  . Smokeless tobacco: Never Used  . Alcohol Use: 1.8 oz/week    3 Cans of beer  per week  . Drug Use: No  . Sexually Active: Not on file   Other Topics Concern  . Not on file   Social History Narrative  . No narrative on file    Objective:  BP 136/54  Pulse 60  Temp 98 F (36.7 C) (Oral)  Resp 18  Wt 169 lb 8 oz (76.885 kg)  SpO2 92%  General appearance: alert, cooperative and appears stated age Ears: normal TM's and external ear canals both ears Throat: lips, mucosa, and tongue normal; teeth and gums normal Neck: no adenopathy, no carotid bruit, supple, symmetrical, trachea midline and thyroid not enlarged, symmetric, no tenderness/mass/nodules Back: symmetric, no curvature. ROM normal. No CVA tenderness. Lungs: clear to auscultation bilaterally chest wall tender to palpation right side under his breast/  Heart: regular rate and rhythm, S1, S2 normal, no  murmur, click, rub or gallop Abdomen: soft, non-tender; bowel sounds normal; no masses,  no organomegaly Pulses: 2+ and symmetric Skin: Skin color, texture, turgor normal. No rashes or lesions Lymph nodes: Cervical, supraclavicular, and axillary nodes normal.  Assessment and Plan:  Back pain, lumbosacral Recent MRI of lumbar spine showing multilevel degenerative disc disease without spinal stenosis. He reports persistent daily pain not managed well with Tylenol alone. Trial of tramadol 50 mg 4 times daily as needed. He must avoid nonsteroidal anti-inflammatories due to his history of chronic kidney disease and hypertension.  Chest pain, localized He is palpably tender over the right rib cage below the nipple secondary to blunt trauma from a mishap with a carport that he was working on. He has no history of bruising at the time of the incident 4 days ago. He does not appear to have a fractured rib but he has been advised to obtain plain films a few days if his pain is not relieved with current medication.   Updated Medication List Outpatient Encounter Prescriptions as of 09/02/2011  Medication Sig Dispense Refill  . amLODipine (NORVASC) 10 MG tablet Take 1 tablet (10 mg total) by mouth daily.  30 tablet  11  . aspirin 81 MG tablet Take 81 mg by mouth daily.        Marland Kitchen doxazosin (CARDURA) 4 MG tablet Take 4 mg by mouth daily.      . furosemide (LASIX) 40 MG tablet Take 1 tablet (40 mg total) by mouth 2 (two) times daily.  60 tablet  5  . glipiZIDE (GLUCOTROL) 5 MG tablet Take 1 tablet (5 mg total) by mouth daily.  30 tablet  3  . hydrALAZINE (APRESOLINE) 50 MG tablet Take 1 tablet (50 mg total) by mouth 3 (three) times daily.  90 tablet  5  . insulin aspart (NOVOLOG) 100 UNIT/ML injection 5 units for cbg 150 but less than 200 8 units for cbg > 200 but less than 240 10 units for cbg > 240 but less than 280 12 units for cbg > 280 but less than 300  3 mL  12  . insulin detemir (LEVEMIR) 100  UNIT/ML injection Inject 40 Units into the skin at bedtime.  3 mL  6  . Insulin Pen Needle 31G X 5 MM MISC Inject insulin once a day  100 each  5  . latanoprost (XALATAN) 0.005 % ophthalmic solution Apply 1 drop to eye at bedtime.      Marland Kitchen losartan (COZAAR) 100 MG tablet TAKE ONE (1) TABLET EACH DAY  30 tablet  5  . metoprolol (LOPRESSOR) 50 MG tablet Take 50 mg by  mouth 2 (two) times daily.        Marland Kitchen MICROLET LANCETS MISC Patient test blood sugar twice daily  100 each  5  . omeprazole (PRILOSEC) 20 MG capsule TAKE ONE (1) CAPSULE EACH DAY  30 capsule  3  . simvastatin (ZOCOR) 40 MG tablet Take 1 tablet (40 mg total) by mouth at bedtime.  30 tablet  3  . travoprost, benzalkonium, (TRAVATAN) 0.004 % ophthalmic solution One drop both eyes at bedtime.      . traMADol (ULTRAM) 50 MG tablet Take 1 tablet (50 mg total) by mouth every 6 (six) hours as needed for pain.  120 tablet  3     No orders of the defined types were placed in this encounter.    No Follow-up on file.

## 2011-09-02 NOTE — Assessment & Plan Note (Signed)
Recent MRI of lumbar spine showing multilevel degenerative disc disease without spinal stenosis. He reports persistent daily pain not managed well with Tylenol alone. Trial of tramadol 50 mg 4 times daily as needed. He must avoid nonsteroidal anti-inflammatories due to his history of chronic kidney disease and hypertension.

## 2011-09-02 NOTE — Patient Instructions (Signed)
You can use the tramadol with or without tylenol every 6 hours as needed for pain  If your rib pain is no better in 3 to  4 days,  Get the x ray .

## 2011-09-02 NOTE — Telephone Encounter (Signed)
Patient came in the office for his appt before being notified that he needed x rays first.

## 2011-09-02 NOTE — Assessment & Plan Note (Signed)
He is palpably tender over the right rib cage below the nipple secondary to blunt trauma from a mishap with a carport that he was working on. He has no history of bruising at the time of the incident 4 days ago. He does not appear to have a fractured rib but he has been advised to obtain plain films a few days if his pain is not relieved with current medication.

## 2011-09-02 NOTE — Telephone Encounter (Signed)
He needs to go get chest x rays NOW, before his appt.  Order in [facility] for [facility] I have peinted it out

## 2011-09-02 NOTE — Telephone Encounter (Signed)
Called patient at home number and got no answer, called cell phone and left a message for him to return call.

## 2011-09-05 ENCOUNTER — Other Ambulatory Visit: Payer: Medicare Other

## 2011-09-05 ENCOUNTER — Telehealth: Payer: Self-pay | Admitting: Internal Medicine

## 2011-09-05 DIAGNOSIS — K5900 Constipation, unspecified: Secondary | ICD-10-CM

## 2011-09-05 MED ORDER — LACTULOSE 20 GM/30ML PO SOLN: 30.0000 mL | ORAL | Status: DC

## 2011-09-05 NOTE — Telephone Encounter (Signed)
Please tell patient to take the laxative i called in to his pharmacy, it's called Lactulsoe 30 ccs every 4 hours unitl he has a large stool.He also needs to drink 48 ounces of water daily and change to a clear liquid diet (jello, chicken soup, etc)  . If he starts vomiting again he needs to go to the ER bc he may be obstructed

## 2011-09-05 NOTE — Telephone Encounter (Signed)
I tried calling the phone number provided and it just kept ringing, so then I tried the other numbers in the chart and had to leave a message asking patient to return my call.

## 2011-09-05 NOTE — Telephone Encounter (Signed)
Caller: [name]/Spouse; PCP: Duncan Dull; CB#: [phone]; ; ; Call regarding Constipation, hard Abdomen and distented, onset 8-5.  Patient vomited x3 on 8-7. Patient had small bowel movement on 8-8, no blood.  Afebrile. FSBS 109 on 8-8.  Patient started Tramadol for pain on 8-5, 1 tablet taken.  Patient outside mowing the yard.  Constipation and Diabetes Gastrointestinal Problems Protocols used.  Please follow up with Patient due to Abdominal Pain over 3 days with loss of appetite and history of diabetes.  Patient was scheduled for xray on 8-7, didn't go because of vomiting, no vomiting currently. No appts available, Please call Spouse back, See immediately disposition.

## 2011-09-06 ENCOUNTER — Ambulatory Visit: Payer: Self-pay | Admitting: Internal Medicine

## 2011-09-06 NOTE — Telephone Encounter (Signed)
Tried calling patient, but phone just rings and rings.  

## 2011-09-09 ENCOUNTER — Telehealth: Payer: Self-pay | Admitting: Internal Medicine

## 2011-09-09 NOTE — Telephone Encounter (Signed)
His chest x-ray was normal. There is no evidence of rib fracture

## 2011-09-09 NOTE — Telephone Encounter (Signed)
Tried calling patient, but phone just rings and rings.

## 2011-09-12 ENCOUNTER — Emergency Department: Payer: Self-pay | Admitting: Emergency Medicine

## 2011-09-12 ENCOUNTER — Telehealth: Payer: Self-pay | Admitting: Internal Medicine

## 2011-09-12 LAB — COMPREHENSIVE METABOLIC PANEL
Albumin: 4 g/dL (ref 3.4–5.0)
Alkaline Phosphatase: 95 U/L (ref 50–136)
Bilirubin,Total: 0.4 mg/dL (ref 0.2–1.0)
Co2: 22 mmol/L (ref 21–32)
Creatinine: 1.87 mg/dL — ABNORMAL HIGH (ref 0.60–1.30)
Glucose: 88 mg/dL (ref 65–99)
Osmolality: 291 (ref 275–301)
Sodium: 143 mmol/L (ref 136–145)
Total Protein: 7 g/dL (ref 6.4–8.2)

## 2011-09-12 LAB — CBC
HCT: 37.9 % — ABNORMAL LOW (ref 40.0–52.0)
HGB: 12.7 g/dL — ABNORMAL LOW (ref 13.0–18.0)
MCH: 30.4 pg (ref 26.0–34.0)
MCHC: 33.5 g/dL (ref 32.0–36.0)
Platelet: 188 10*3/uL (ref 150–440)
RBC: 4.17 10*6/uL — ABNORMAL LOW (ref 4.40–5.90)

## 2011-09-12 LAB — TROPONIN I: Troponin-I: <0.02 ng/mL

## 2011-09-12 NOTE — Telephone Encounter (Signed)
Spoke with patient's wife and she is taking him to ER for swelling.

## 2011-09-12 NOTE — Telephone Encounter (Signed)
Spoke with patient's wife and she is taking him to Er for swelling.

## 2011-09-12 NOTE — Telephone Encounter (Signed)
Patients wife notified

## 2011-09-12 NOTE — Telephone Encounter (Signed)
Caller: [name]/Spouse; Patient Name: Jason Aguilar; PCP: Duncan Dull; Best Callback Phone Number: [phone].  Calling today 09/12/11 regarding husband woke this AM and his feet, legs and stomach, and face are swollen.  Has been getting short of breath for the last couple of days.  Blood pressure this AM 178/87 and heart rate was 48.  No chest pain.  Wife says patient does normally swell.  No known cardiac or respiratory conditions according to wife.  No shortness of breath at this time.  Wife said yesterday his stomach was just a little bloated and this morning, it is is stomach, legs and face.  Emergent symptoms ruled out by Edema Atraumatic guidelines with exception of swelling came on in a matter of minutes.  No appointments available in EPIC.  OFFICE PLEASE CALL WIFE BACK AT ABOVE CALL NUMBER AS SOON AS POSSIBLE (THIS WAS AN ED DISPOSITION) FOR A POSSIBLE WORK IN APPOINTMENT OR DIRECT THEM TO ED IF NEEDED.

## 2011-09-17 ENCOUNTER — Encounter: Payer: Self-pay | Admitting: Internal Medicine

## 2011-11-26 ENCOUNTER — Ambulatory Visit: Payer: Medicare Other | Admitting: Internal Medicine

## 2011-11-26 DIAGNOSIS — Z0289 Encounter for other administrative examinations: Secondary | ICD-10-CM

## 2011-11-28 ENCOUNTER — Other Ambulatory Visit: Payer: Self-pay

## 2011-11-28 MED ORDER — OMEPRAZOLE 20 MG PO CPDR: 20.0000 mg | DELAYED_RELEASE_CAPSULE | Freq: Every day | ORAL | Status: DC

## 2011-11-28 NOTE — Telephone Encounter (Signed)
Refill request for Omeprazole 20 mg #30 3 R sent electronic to [facility]

## 2012-01-20 ENCOUNTER — Other Ambulatory Visit: Payer: Self-pay | Admitting: Internal Medicine

## 2012-01-20 MED ORDER — SIMVASTATIN 40 MG PO TABS: 40.0000 mg | ORAL_TABLET | Freq: Every day | ORAL | Status: DC

## 2012-01-20 NOTE — Telephone Encounter (Signed)
Simvastatin (Zocor) 40 mg tablet  Take 1 tablet by mouth at bedtime  # 30

## 2012-01-20 NOTE — Telephone Encounter (Signed)
Med filled.  

## 2012-03-02 ENCOUNTER — Other Ambulatory Visit: Payer: Self-pay | Admitting: *Deleted

## 2012-03-02 MED ORDER — INSULIN PEN NEEDLE 31G X 5 MM MISC: Status: DC

## 2012-03-02 NOTE — Telephone Encounter (Signed)
Med filled.  

## 2012-05-27 ENCOUNTER — Other Ambulatory Visit: Payer: Self-pay | Admitting: *Deleted

## 2012-05-27 ENCOUNTER — Other Ambulatory Visit: Payer: Self-pay | Admitting: Internal Medicine

## 2012-05-27 DIAGNOSIS — E1059 Type 1 diabetes mellitus with other circulatory complications: Secondary | ICD-10-CM

## 2012-05-27 NOTE — Telephone Encounter (Signed)
Please Advise...Marland KitchenMarland KitchenMarland Kitchen  Patient wants refill on Simvastatin (Zocor) 40 mg #30 0rf Patient was last seen on 09-02-2011 and last lab was on 07-09-2011

## 2012-05-28 NOTE — Telephone Encounter (Signed)
No refills,  Per protocol.  Has to have fasting lipids and CMET,  hba1c

## 2012-05-29 ENCOUNTER — Other Ambulatory Visit: Payer: Self-pay | Admitting: *Deleted

## 2012-05-29 NOTE — Telephone Encounter (Signed)
Left message on patient voicemail that he needs to come in for fasting lipids and CMET, hba1c.

## 2012-06-01 MED ORDER — SIMVASTATIN 40 MG PO TABS: 40.0000 mg | ORAL_TABLET | Freq: Every day | ORAL | Status: DC

## 2012-06-24 ENCOUNTER — Ambulatory Visit: Payer: Self-pay | Admitting: Nephrology

## 2012-07-07 ENCOUNTER — Ambulatory Visit: Payer: Medicare Other | Admitting: Endocrinology

## 2012-09-23 ENCOUNTER — Other Ambulatory Visit: Payer: Self-pay | Admitting: Internal Medicine

## 2012-11-10 ENCOUNTER — Encounter: Payer: Self-pay | Admitting: *Deleted

## 2012-11-11 ENCOUNTER — Ambulatory Visit: Payer: Self-pay | Admitting: Podiatry

## 2013-07-23 LAB — COMPREHENSIVE METABOLIC PANEL
AST: 26 U/L (ref 15–37)
Albumin: 3.6 g/dL (ref 3.4–5.0)
Alkaline Phosphatase: 99 U/L
Anion Gap: 8 (ref 7–16)
BUN: 27 mg/dL — ABNORMAL HIGH (ref 7–18)
Bilirubin,Total: 0.7 mg/dL (ref 0.2–1.0)
CALCIUM: 9.7 mg/dL (ref 8.5–10.1)
CHLORIDE: 114 mmol/L — AB (ref 98–107)
CO2: 17 mmol/L — AB (ref 21–32)
Creatinine: 1.88 mg/dL — ABNORMAL HIGH (ref 0.60–1.30)
EGFR (Non-African Amer.): 34 — ABNORMAL LOW
GFR CALC AF AMER: 40 — AB
GLUCOSE: 150 mg/dL — AB (ref 65–99)
Osmolality: 286 (ref 275–301)
Potassium: 5.2 mmol/L — ABNORMAL HIGH (ref 3.5–5.1)
SGPT (ALT): 20 U/L (ref 12–78)
Sodium: 139 mmol/L (ref 136–145)
Total Protein: 6.8 g/dL (ref 6.4–8.2)

## 2013-07-23 LAB — CBC
HCT: 39.5 % — ABNORMAL LOW (ref 40.0–52.0)
HGB: 12.9 g/dL — ABNORMAL LOW (ref 13.0–18.0)
MCH: 29.7 pg (ref 26.0–34.0)
MCHC: 32.8 g/dL (ref 32.0–36.0)
MCV: 91 fL (ref 80–100)
Platelet: 169 10*3/uL (ref 150–440)
RBC: 4.36 10*6/uL — AB (ref 4.40–5.90)
RDW: 14.4 % (ref 11.5–14.5)
WBC: 10.8 10*3/uL — AB (ref 3.8–10.6)

## 2013-07-23 LAB — TROPONIN I: Troponin-I: <0.02 ng/mL

## 2013-07-23 LAB — PRO B NATRIURETIC PEPTIDE: B-Type Natriuretic Peptide: 1778 pg/mL — ABNORMAL HIGH (ref 0–450)

## 2013-07-24 ENCOUNTER — Inpatient Hospital Stay: Payer: Self-pay | Admitting: Internal Medicine

## 2013-07-25 LAB — BASIC METABOLIC PANEL
Anion Gap: 8 (ref 7–16)
BUN: 46 mg/dL — ABNORMAL HIGH (ref 7–18)
Calcium, Total: 9.8 mg/dL (ref 8.5–10.1)
Chloride: 110 mmol/L — ABNORMAL HIGH (ref 98–107)
Co2: 19 mmol/L — ABNORMAL LOW (ref 21–32)
Creatinine: 2.46 mg/dL — ABNORMAL HIGH (ref 0.60–1.30)
EGFR (African American): 29 — ABNORMAL LOW
EGFR (Non-African Amer.): 25 — ABNORMAL LOW
Glucose: 161 mg/dL — ABNORMAL HIGH (ref 65–99)
Osmolality: 289 (ref 275–301)
Potassium: 5.2 mmol/L — ABNORMAL HIGH (ref 3.5–5.1)
Sodium: 137 mmol/L (ref 136–145)

## 2013-07-25 LAB — CBC WITH DIFFERENTIAL/PLATELET
BASOS ABS: 0 10*3/uL (ref 0.0–0.1)
BASOS PCT: 0 %
EOS PCT: 0 %
Eosinophil #: 0 10*3/uL (ref 0.0–0.7)
HCT: 36.4 % — AB (ref 40.0–52.0)
HGB: 11.8 g/dL — ABNORMAL LOW (ref 13.0–18.0)
Lymphocyte #: 0.7 10*3/uL — ABNORMAL LOW (ref 1.0–3.6)
Lymphocyte %: 3.4 %
MCH: 29.2 pg (ref 26.0–34.0)
MCHC: 32.5 g/dL (ref 32.0–36.0)
MCV: 90 fL (ref 80–100)
MONOS PCT: 3.6 %
Monocyte #: 0.8 x10 3/mm (ref 0.2–1.0)
NEUTROS ABS: 19.7 10*3/uL — AB (ref 1.4–6.5)
Neutrophil %: 93 %
PLATELETS: 195 10*3/uL (ref 150–440)
RBC: 4.04 10*6/uL — ABNORMAL LOW (ref 4.40–5.90)
RDW: 13.9 % (ref 11.5–14.5)
WBC: 21.2 10*3/uL — ABNORMAL HIGH (ref 3.8–10.6)

## 2013-07-25 LAB — HEMOGLOBIN A1C: HEMOGLOBIN A1C: 8.4 % — AB (ref 4.2–6.3)

## 2013-07-26 LAB — BASIC METABOLIC PANEL
Anion Gap: 7 (ref 7–16)
BUN: 54 mg/dL — AB (ref 7–18)
CHLORIDE: 110 mmol/L — AB (ref 98–107)
Calcium, Total: 9.7 mg/dL (ref 8.5–10.1)
Co2: 20 mmol/L — ABNORMAL LOW (ref 21–32)
Creatinine: 2.39 mg/dL — ABNORMAL HIGH (ref 0.60–1.30)
EGFR (African American): 30 — ABNORMAL LOW
GFR CALC NON AF AMER: 26 — AB
GLUCOSE: 169 mg/dL — AB (ref 65–99)
OSMOLALITY: 292 (ref 275–301)
Potassium: 4.4 mmol/L (ref 3.5–5.1)
Sodium: 137 mmol/L (ref 136–145)

## 2013-07-27 LAB — BASIC METABOLIC PANEL
Anion Gap: 7 (ref 7–16)
BUN: 68 mg/dL — AB (ref 7–18)
CREATININE: 2.56 mg/dL — AB (ref 0.60–1.30)
Calcium, Total: 9.5 mg/dL (ref 8.5–10.1)
Chloride: 110 mmol/L — ABNORMAL HIGH (ref 98–107)
Co2: 20 mmol/L — ABNORMAL LOW (ref 21–32)
EGFR (Non-African Amer.): 24 — ABNORMAL LOW
GFR CALC AF AMER: 27 — AB
Glucose: 185 mg/dL — ABNORMAL HIGH (ref 65–99)
Osmolality: 298 (ref 275–301)
POTASSIUM: 4.4 mmol/L (ref 3.5–5.1)
Sodium: 137 mmol/L (ref 136–145)

## 2013-07-28 LAB — BASIC METABOLIC PANEL
ANION GAP: 9 (ref 7–16)
BUN: 74 mg/dL — AB (ref 7–18)
CALCIUM: 9.7 mg/dL (ref 8.5–10.1)
CREATININE: 2.53 mg/dL — AB (ref 0.60–1.30)
Chloride: 108 mmol/L — ABNORMAL HIGH (ref 98–107)
Co2: 21 mmol/L (ref 21–32)
EGFR (African American): 28 — ABNORMAL LOW
EGFR (Non-African Amer.): 24 — ABNORMAL LOW
Glucose: 175 mg/dL — ABNORMAL HIGH (ref 65–99)
Osmolality: 302 (ref 275–301)
Potassium: 4.5 mmol/L (ref 3.5–5.1)
SODIUM: 138 mmol/L (ref 136–145)

## 2013-07-29 LAB — CULTURE, BLOOD (SINGLE)

## 2013-07-31 LAB — CULTURE, BLOOD (SINGLE)

## 2013-09-17 ENCOUNTER — Emergency Department: Payer: Self-pay | Admitting: Internal Medicine

## 2013-09-17 LAB — BASIC METABOLIC PANEL
Anion Gap: 9 (ref 7–16)
BUN: 23 mg/dL — ABNORMAL HIGH (ref 7–18)
CO2: 19 mmol/L — AB (ref 21–32)
Calcium, Total: 9.4 mg/dL (ref 8.5–10.1)
Chloride: 114 mmol/L — ABNORMAL HIGH (ref 98–107)
Creatinine: 2.09 mg/dL — ABNORMAL HIGH (ref 0.60–1.30)
EGFR (Non-African Amer.): 30 — ABNORMAL LOW
GFR CALC AF AMER: 35 — AB
Glucose: 131 mg/dL — ABNORMAL HIGH (ref 65–99)
OSMOLALITY: 289 (ref 275–301)
Potassium: 4.4 mmol/L (ref 3.5–5.1)
Sodium: 142 mmol/L (ref 136–145)

## 2013-09-17 LAB — CBC
HCT: 40.8 % (ref 40.0–52.0)
HGB: 13.3 g/dL (ref 13.0–18.0)
MCH: 29.7 pg (ref 26.0–34.0)
MCHC: 32.6 g/dL (ref 32.0–36.0)
MCV: 91 fL (ref 80–100)
Platelet: 185 10*3/uL (ref 150–440)
RBC: 4.48 10*6/uL (ref 4.40–5.90)
RDW: 14.3 % (ref 11.5–14.5)
WBC: 6.7 10*3/uL (ref 3.8–10.6)

## 2013-09-17 LAB — TROPONIN I
Troponin-I: <0.02 ng/mL
Troponin-I: <0.02 ng/mL

## 2013-10-20 ENCOUNTER — Other Ambulatory Visit: Payer: Self-pay | Admitting: Internal Medicine

## 2014-05-21 NOTE — Discharge Summary (Signed)
PATIENT NAME:  Jason Aguilar, Jason Aguilar MR#:  [id number] DATE OF BIRTH:  26-Jan-1936  DATE OF ADMISSION:  07/24/2013 DATE OF DISCHARGE:  07/28/2013  PRIMARY CARE PHYSICIAN: Dr. Derrel Nip.   FINAL DIAGNOSES:  1. Chronic obstructive pulmonary disease exacerbation with pneumonia.  2. Diabetes.  3. Acute renal failure on chronic kidney disease.  4. Hypertension.  5. Sleep apnea.  6. Hyperlipidemia.  7. Glaucoma.  8. Benign prostatic hypertrophy.   MEDICATIONS ON DISCHARGE: Include metoprolol 50 mg twice a day, simvastatin 40 mg at bedtime, amlodipine 10 mg daily, aspirin 81 mg daily, doxazosin 4 mg daily, glipizide 5 mg daily, latanoprost 0.005% ophthalmic solution 1 drop each eye at bedtime, omeprazole 20 mg daily, Travatan Z 0.004% ophthalmic solution 1 drop each eye at bedtime, hydralazine 100 mg 3 times a day, Levemir 40 units subcutaneous injection at bedtime, clonidine 0.1 mg twice a day, prednisone taper 5 mg 4 tablets day one, 3 tablets day two, 2 tablets day three, 1 tablet day four and five, then stop. Humulin R sliding scale before meals and at bedtime, Ceftin 500 mg 1 tablet twice a day for 6 days. Stop losartan.   DIET: Low sodium diet, carbohydrate -controlled diet, regular consistency.   ACTIVITY: As tolerated.   FOLLOWUP: Follow up 1-2 weeks with Dr. Derrel Nip.   HOSPITAL COURSE: The patient was admitted 07/24/2013, discharged 07/28/2013. The patient was admitted with pneumonia, chronic obstructive pulmonary disease exacerbation, hyperkalemia, and renal insufficiency.   LABORATORY AND RADIOLOGICAL DATA DURING THE HOSPITAL COURSE: Troponin was negative. Glucose 150, BUN 27, creatinine 1.88, sodium 139, potassium 5.2, chloride 114, CO2 of 17, calcium 9.7. Liver function tests normal range. White blood cell count 10.8, H and H 12.9 and 39.5, platelet count of 169,000. BNP 1778.   EKG: Normal sinus rhythm.  No acute ST-T wave changes. Chest x-ray showed a mild left base opacity concerning for  pneumonia. Blood culture grew out a contaminant of coagulate, negative Staphylococcus.   Lactic acid of 4. Hemoglobin A1c of 8.4, creatinine worsened to 2.46 and then came down to 2.39, worsened to 2.56 and then came down to 2.53.   Patient's losartan was held.  Potassium upon discharge 4.5.   HOSPITAL COURSE PER PROBLEM LIST:  1. For the patient's chronic obstructive pulmonary disease exacerbation with pneumonia, patient was on high-dose steroids most of the hospital stay, brought down to medium dose steroids, and switched over to prednisone taper upon discharge. He was started on Rocephin and Zithromax. He completed the Zithromax here in the hospital. He was switched over to Ceftin upon discharge. With the positive blood culture 2 days later, he received a few days of IV Zyvox. When the blood culture came back contaminant, the Zyvox was stopped. He will complete a course of Ceftin for his pneumonia as an outpatient.  Lungs upon discharge, good air entry. After coughing, his lungs are clear.  2. Diabetes. Sugar is high while on the steroids. He is on Lantus and glipizide. His hemoglobin A1c is elevated and needs better control as outpatient.  3. Acute renal failure on chronic kidney disease. The patient did not want IV fluids during the hospital course for more than a day secondary to swelling. I had to stop the IV fluids and we will let the creatinine come down on its own. I held his losartan because of hyperkalemia and acute renal failure.  4. Hypertension. Initially, his clonidine was not started on presentation.  I had to restart that in order to get  better blood pressure control. Blood pressure borderline during the hospital course. The patient resistant on increasing the dose. Blood pressure upon discharge 160/63.  5. Sleep apnea on CPAP at night.  6. Hyperlipidemia.  On simvastatin.  7. Glaucoma. Continue his eye drops.  8. Benign prostatic hypertrophy. On doxazosin.   TIME SPENT ON  DISCHARGE: 35 minutes.    ____________________________ Jason Conch. Leslye Peer, Jason Aguilar rjw:dd D: 07/28/2013 16:53:52 ET T: 07/29/2013 05:33:18 ET JOB#: [id number]  cc: Jason Conch. Leslye Peer, Jason Aguilar, <Dictator> Jason Medina, Jason Aguilar Jason Aguilar ELECTRONICALLY SIGNED 07/29/2013 16:21

## 2014-05-21 NOTE — H&P (Signed)
PATIENT NAME:  Jason Aguilar, Jason Aguilar MR#:  [id number] DATE OF BIRTH:  1935/02/23  DATE OF ADMISSION:  07/24/2013  PRIMARY CARE PHYSICIAN: The patient does not remember the name of the physician.   REFERRING PHYSICIAN: Dr. Lisa Roca.   CHIEF COMPLAINT: Shortness of breath.   HISTORY OF PRESENT ILLNESS: Jason Aguilar is a 79 year old male with a known history of diabetes mellitus, insulin-dependent, hypertension, and hyperlipidemia who comes to the Emergency Department with complaints of cough and shortness of breath since this morning. The patient states he has had this cough for the last couple of days; however, started to experience shortness of breath since this afternoon. The patient went to bed with CPAP, woke up, and started to experience severe shortness of breath with wheezing. Concerning this, came to the Emergency Department. Work-up in the Emergency Department, the patient is found to have left basilar opacity, concerning for pneumonia, subsegmental atelectasis. The patient denies having any fever. Denies having any chest pain or palpations. Denies having any lower extremity swelling. Denies having any PND, orthopnea. The patient has chronic renal insufficiency with a creatinine of 1.88, has elevated BNP of 1700. Mild elevation of the WBC count of 10.8.   PAST MEDICAL HISTORY:  1.  Hypertension.  2.  Hyperlipidemia.  3.  Diabetes mellitus, insulin-dependent.  4.  Chronic kidney disease.   ALLERGIES:  1.  PROTONIX.  2.  GOLD MULTIVITAMINS.   HOME MEDICATIONS:  1.  Travatan eye drops once a day.  2.  Simvastatin 40 mg once a day. 3.  Omeprazole 20 mg once a day. 4.  Metoprolol 50 mg 2 times a day.  5.  Losartan 100 mg once daily. 6.  Levemir 35 units once a day.  7.  Lisinopril 1 drop to both eyes.  8.  Hydralazine 50 mg 3 times a day.  9.  Glipizide 5 mg once a day. 10.  Doxazosin 40 mg once a day.  11.  Aspirin 81 mg once a day. 12.  Amlodipine 10 mg once a day.   SOCIAL HISTORY:  Quit smoking 30 years back. Previous to that, smoked heavily. Denies drinking alcohol or using illicit drugs. Married, lives with his wife. Independent of ADLs.   FAMILY HISTORY:  Diabetes mellitus.   REVIEW OF SYSTEMS: The patient is an extremely poor historian.  CONSTITUTIONAL: Denies any generalized weakness.  EYES: No change in vision.  EAR, NOSE, AND THROAT: No change in hearing.  RESPIRATORY: Has cough, shortness of breath.  CARDIOVASCULAR: No chest pain, palpations.  GASTROINTESTINAL: No nausea or vomiting. Has abdominal distention. No diarrhea.  GENITOURINARY: No dysuria or hematuria.  HEMATOLOGIC: Easy bruising or bleeding.  ENDOCRINE: Has diagnosis of diabetes mellitus.  SKIN: No rash or lesions.  MUSCULOSKELETAL: No joint pains and aches.  NEUROLOGIC: No weakness or numbness in any part of the body.   PHYSICAL EXAMINATION:  GENERAL: This is a well-built, well-nourished male, lying down in the bed, not in distress.  VITAL SIGNS: Temperature 98.1, pulse 96, blood pressure 156/46, respiratory rate of 16, oxygen saturation is 92% on room air.  HEENT: Head normocephalic, atraumatic. Eyes, no scleral icterus.  Conjunctivae normal. Pupils equal and reactive.  Extraocular movements are intact. Mucous membranes moist. No pharyngeal erythema.  NECK: Supple. No lymphadenopathy. No JVD. No carotid bruit. No thyromegaly.  CHEST: Has no focal tenderness.  Bilateral diffuse wheezing.  Quiet breath sounds on both sides.  HEART: S1, S2, regular, tachycardia.  ABDOMEN: Bowel sounds present. Soft, nontender, nondistended. Mildly  distended.  EXTREMITIES: No pedal edema. Pulses 2+.  SKIN: No rash or lesions.  MUSCULOSKELETAL: Good range of motion in all extremities.  NEUROLOGIC: The patient is alert, oriented to place, person, and time. Cranial nerves II-XII intact. Motor 5/5 in upper and lower extremities.   LABORATORY DATA: Chest x-ray shows mild left basilar opacity, concerning for pneumonia.  Troponin less than 0.02.  CMP: BUN 27, creatinine of 1.88, bicarbonate 17, and potassium of 5.2. CBC: WBC of 10.8, hemoglobin 10.9, platelet count of 169,000.   ASSESSMENT AND PLAN: Jason Aguilar is a 79 year old male who comes with pneumonia and chronic obstructive pulmonary disease exacerbation.   1.  Pneumonia: Will treat it as a community-acquired pneumonia with Rocephin and Zithromax. If the patient improves with the shortness of breath, the patient could be discharged home with Augmentin and Zithromax.   2.  Chronic obstructive pulmonary disease exacerbation. Continue with the breathing treatments and Solu-Medrol.   3.  Hyperkalemia.  Potassium of 5.2,  gave a dose of Kayexalate.   4.  Renal insufficiency. The patient's creatinine was 1.87, even in 2013. Stable.   5.  Keep the patient on deep vein thrombosis prophylaxis with Lovenox.   TIME SPENT: 50 minutes.    ____________________________ Monica Becton, MD pv:ts D: 07/24/2013 01:53:39 ET T: 07/24/2013 09:19:39 ET JOB#: [id number]  cc: Monica Becton, MD, <Dictator> Monica Becton MD ELECTRONICALLY SIGNED 08/05/2013 0:34

## 2014-05-22 NOTE — Op Note (Signed)
PATIENT NAME:  Jason Aguilar, Jason Aguilar MR#:  [id number] DATE OF BIRTH:  08-Mar-1935  DATE OF PROCEDURE:  03/13/2011  PREOPERATIVE DIAGNOSIS: Bladder tumor.   POSTOPERATIVE DIAGNOSIS: Bladder tumor.    PROCEDURES:  1. Transurethral resection of bladder tumor (small).  2. Retrograde pyelograms, bilateral.   SURGEON: Scott C. Bernardo Heater, MD   ASSISTANT: None.   ANESTHETIC: General.   INDICATIONS: The patient is a 79 year old male on evaluation by Nephrology for chronic kidney disease had a renal ultrasound and survey views of the bladder suggested a mass at the bladder base. A noncontrast CT scan was subsequently performed which confirmed this finding. Office cystoscopy showed a papillary bladder tumor. Contrast imaging of the upper tracts has not been performed secondary to chronic kidney disease.   FINDINGS:  1. Cystoscopic findings. The urethra was normal without strictures. Prostate shows mild lateral lobe enlargement. There was a 2 cm papillary tumor just beyond the right ureteral orifice. No other bladder mucosal lesions were identified.  2. Retrograde pyelograms. The left ureter is normal in caliber without dilation or filling defects. The left collecting system shows normal renal pelvis and normal calyceal anatomy. The right retrograde pyelogram was also with identical normal findings.   DESCRIPTION OF PROCEDURE: The patient was taken to the operating room where a general anesthetic was administered. He was placed in the low lithotomy position and his external genitalia were prepped and draped sterilely. Time-out was performed. The meatus would not accept a 28 French continuous flow resectoscope sheath. The meatus was dilated with Leander Rams sounds to 66 [place]. A resectoscope sheath with visual obturator was lubricated and passed under direct vision into the bladder. [facility] resectoscope with loop was then placed into the sheath. Cystoscopy was performed with findings as described above. The right  bladder tumor was resected with a bipolar electrode down to superficial muscle. Resection site and surrounding mucosa was then cauterized. All specimen was removed with irrigation. The catheter adapter was placed on the resectoscope sheath. An 8 [place] cone-tipped catheter was placed through the channel and into the left ureteral orifice. Left retrograde pyelogram was performed with findings as described above. There was good efflux of contrast on real-time fluoroscopy. Attention was directed to the right ureteral orifice where an identical procedure was performed with similar findings. The resectoscope was removed. An 44 French Foley catheter was placed with return of clear effluent upon irrigation. A B and O suppository was placed per rectum. The patient was taken to PAC-U in stable condition. There were no complications. EBL minimal.   ____________________________ Ronda Fairly. Bernardo Heater, MD scs:drc D: 03/13/2011 08:14:43 ET T: 03/13/2011 09:41:43 ET JOB#: [id number]  cc: Nicki Reaper C. Bernardo Heater, MD, <Dictator> Abbie Sons MD ELECTRONICALLY SIGNED 03/13/2011 10:00

## 2014-08-18 ENCOUNTER — Ambulatory Visit: Payer: Medicare Other

## 2014-09-08 ENCOUNTER — Ambulatory Visit: Payer: Medicare Other | Admitting: Podiatry

## 2014-09-19 ENCOUNTER — Other Ambulatory Visit: Payer: Medicare Other

## 2014-09-23 ENCOUNTER — Encounter: Admission: RE | Payer: Self-pay | Source: Ambulatory Visit

## 2014-09-23 ENCOUNTER — Ambulatory Visit: Admission: RE | Admit: 2014-09-23 | Payer: Medicare Other | Source: Ambulatory Visit | Admitting: Vascular Surgery

## 2014-09-23 SURGERY — ARTERIOVENOUS (AV) FISTULA CREATION
Anesthesia: Choice | Laterality: Left

## 2014-12-02 ENCOUNTER — Other Ambulatory Visit: Payer: Self-pay | Admitting: Vascular Surgery

## 2014-12-05 ENCOUNTER — Encounter: Admission: RE | Payer: Self-pay | Source: Ambulatory Visit

## 2014-12-05 ENCOUNTER — Ambulatory Visit: Admission: RE | Admit: 2014-12-05 | Payer: Medicare Other | Source: Ambulatory Visit | Admitting: Vascular Surgery

## 2014-12-05 SURGERY — DIALYSIS/PERMA CATHETER INSERTION
Anesthesia: Moderate Sedation

## 2014-12-19 ENCOUNTER — Other Ambulatory Visit: Payer: Self-pay | Admitting: Vascular Surgery

## 2014-12-21 ENCOUNTER — Ambulatory Visit: Admission: RE | Admit: 2014-12-21 | Payer: Medicare Other | Source: Ambulatory Visit | Admitting: Vascular Surgery

## 2014-12-21 ENCOUNTER — Encounter: Admission: RE | Payer: Self-pay | Source: Ambulatory Visit

## 2014-12-21 SURGERY — DIALYSIS/PERMA CATHETER INSERTION
Anesthesia: Moderate Sedation

## 2015-01-02 ENCOUNTER — Emergency Department: Payer: Medicare Other

## 2015-01-02 ENCOUNTER — Encounter: Payer: Self-pay | Admitting: Emergency Medicine

## 2015-01-02 ENCOUNTER — Inpatient Hospital Stay
Admission: EM | Admit: 2015-01-02 | Discharge: 2015-01-06 | DRG: 304 | Disposition: A | Discharge status: Home / Self Care | Payer: Medicare Other | Attending: Internal Medicine | Admitting: Internal Medicine

## 2015-01-02 DIAGNOSIS — R748 Abnormal levels of other serum enzymes: Secondary | ICD-10-CM | POA: Diagnosis present

## 2015-01-02 DIAGNOSIS — I5031 Acute diastolic (congestive) heart failure: Secondary | ICD-10-CM

## 2015-01-02 DIAGNOSIS — D631 Anemia in chronic kidney disease: Secondary | ICD-10-CM | POA: Diagnosis present

## 2015-01-02 DIAGNOSIS — G4733 Obstructive sleep apnea (adult) (pediatric): Secondary | ICD-10-CM | POA: Diagnosis present

## 2015-01-02 DIAGNOSIS — Z992 Dependence on renal dialysis: Secondary | ICD-10-CM | POA: Diagnosis not present

## 2015-01-02 DIAGNOSIS — IMO0002 Reserved for concepts with insufficient information to code with codable children: Secondary | ICD-10-CM

## 2015-01-02 DIAGNOSIS — K219 Gastro-esophageal reflux disease without esophagitis: Secondary | ICD-10-CM | POA: Diagnosis present

## 2015-01-02 DIAGNOSIS — Z794 Long term (current) use of insulin: Secondary | ICD-10-CM

## 2015-01-02 DIAGNOSIS — J449 Chronic obstructive pulmonary disease, unspecified: Secondary | ICD-10-CM | POA: Diagnosis present

## 2015-01-02 DIAGNOSIS — I160 Hypertensive urgency: Secondary | ICD-10-CM | POA: Diagnosis present

## 2015-01-02 DIAGNOSIS — Z7982 Long term (current) use of aspirin: Secondary | ICD-10-CM | POA: Diagnosis not present

## 2015-01-02 DIAGNOSIS — N2581 Secondary hyperparathyroidism of renal origin: Secondary | ICD-10-CM | POA: Diagnosis present

## 2015-01-02 DIAGNOSIS — I132 Hypertensive heart and chronic kidney disease with heart failure and with stage 5 chronic kidney disease, or end stage renal disease: Secondary | ICD-10-CM | POA: Diagnosis present

## 2015-01-02 DIAGNOSIS — E11649 Type 2 diabetes mellitus with hypoglycemia without coma: Secondary | ICD-10-CM | POA: Diagnosis not present

## 2015-01-02 DIAGNOSIS — E785 Hyperlipidemia, unspecified: Secondary | ICD-10-CM | POA: Diagnosis present

## 2015-01-02 DIAGNOSIS — N400 Enlarged prostate without lower urinary tract symptoms: Secondary | ICD-10-CM | POA: Diagnosis present

## 2015-01-02 DIAGNOSIS — E872 Acidosis: Secondary | ICD-10-CM | POA: Diagnosis present

## 2015-01-02 DIAGNOSIS — R778 Other specified abnormalities of plasma proteins: Secondary | ICD-10-CM

## 2015-01-02 DIAGNOSIS — D509 Iron deficiency anemia, unspecified: Secondary | ICD-10-CM | POA: Diagnosis present

## 2015-01-02 DIAGNOSIS — Z8551 Personal history of malignant neoplasm of bladder: Secondary | ICD-10-CM

## 2015-01-02 DIAGNOSIS — R7989 Other specified abnormal findings of blood chemistry: Secondary | ICD-10-CM

## 2015-01-02 DIAGNOSIS — E1165 Type 2 diabetes mellitus with hyperglycemia: Secondary | ICD-10-CM | POA: Diagnosis present

## 2015-01-02 DIAGNOSIS — R739 Hyperglycemia, unspecified: Secondary | ICD-10-CM

## 2015-01-02 DIAGNOSIS — N186 End stage renal disease: Secondary | ICD-10-CM | POA: Diagnosis present

## 2015-01-02 DIAGNOSIS — N179 Acute kidney failure, unspecified: Secondary | ICD-10-CM | POA: Diagnosis present

## 2015-01-02 DIAGNOSIS — E11319 Type 2 diabetes mellitus with unspecified diabetic retinopathy without macular edema: Secondary | ICD-10-CM | POA: Diagnosis present

## 2015-01-02 DIAGNOSIS — H409 Unspecified glaucoma: Secondary | ICD-10-CM | POA: Diagnosis present

## 2015-01-02 DIAGNOSIS — E1122 Type 2 diabetes mellitus with diabetic chronic kidney disease: Secondary | ICD-10-CM | POA: Diagnosis present

## 2015-01-02 DIAGNOSIS — Z888 Allergy status to other drugs, medicaments and biological substances status: Secondary | ICD-10-CM

## 2015-01-02 DIAGNOSIS — I509 Heart failure, unspecified: Secondary | ICD-10-CM

## 2015-01-02 DIAGNOSIS — Z79899 Other long term (current) drug therapy: Secondary | ICD-10-CM | POA: Diagnosis not present

## 2015-01-02 DIAGNOSIS — Z87891 Personal history of nicotine dependence: Secondary | ICD-10-CM | POA: Diagnosis not present

## 2015-01-02 DIAGNOSIS — I161 Hypertensive emergency: Secondary | ICD-10-CM | POA: Diagnosis present

## 2015-01-02 DIAGNOSIS — I159 Secondary hypertension, unspecified: Secondary | ICD-10-CM

## 2015-01-02 LAB — CBC WITH DIFFERENTIAL/PLATELET
Basophils Absolute: 0 10*3/uL (ref 0–0.1)
EOS ABS: 0 10*3/uL (ref 0–0.7)
HCT: 29.2 % — ABNORMAL LOW (ref 40.0–52.0)
HEMOGLOBIN: 9.6 g/dL — AB (ref 13.0–18.0)
Lymphocytes Relative: 11 %
Lymphs Abs: 1.1 10*3/uL (ref 1.0–3.6)
MCH: 29.2 pg (ref 26.0–34.0)
MCHC: 33 g/dL (ref 32.0–36.0)
MCV: 88.5 fL (ref 80.0–100.0)
Monocytes Absolute: 0.9 10*3/uL (ref 0.2–1.0)
Monocytes Relative: 9 %
Neutro Abs: 7.7 10*3/uL — ABNORMAL HIGH (ref 1.4–6.5)
PLATELETS: 205 10*3/uL (ref 150–440)
RBC: 3.3 MIL/uL — AB (ref 4.40–5.90)
RDW: 15 % — ABNORMAL HIGH (ref 11.5–14.5)
WBC: 9.7 10*3/uL (ref 3.8–10.6)

## 2015-01-02 LAB — BASIC METABOLIC PANEL
Anion gap: 10 (ref 5–15)
BUN: 75 mg/dL — ABNORMAL HIGH (ref 6–20)
CHLORIDE: 108 mmol/L (ref 101–111)
CO2: 23 mmol/L (ref 22–32)
CREATININE: 7.94 mg/dL — AB (ref 0.61–1.24)
Calcium: 8 mg/dL — ABNORMAL LOW (ref 8.9–10.3)
GFR, EST AFRICAN AMERICAN: 7 mL/min — AB (ref >60)
GFR, EST NON AFRICAN AMERICAN: 6 mL/min — AB (ref >60)
Glucose, Bld: 306 mg/dL — ABNORMAL HIGH (ref 65–99)
POTASSIUM: 4.9 mmol/L (ref 3.5–5.1)
SODIUM: 141 mmol/L (ref 135–145)

## 2015-01-02 LAB — TROPONIN I: TROPONIN I: 0.04 ng/mL — AB (ref <0.031)

## 2015-01-02 LAB — GLUCOSE, CAPILLARY: GLUCOSE-CAPILLARY: 232 mg/dL — AB (ref 65–99)

## 2015-01-02 LAB — BRAIN NATRIURETIC PEPTIDE: B Natriuretic Peptide: 1454 pg/mL — ABNORMAL HIGH (ref 0.0–100.0)

## 2015-01-02 LAB — HEPATIC FUNCTION PANEL
ALT: 11 U/L — AB (ref 17–63)
AST: 10 U/L — ABNORMAL LOW (ref 15–41)
Albumin: 3 g/dL — ABNORMAL LOW (ref 3.5–5.0)
Alkaline Phosphatase: 57 U/L (ref 38–126)
TOTAL PROTEIN: 5.4 g/dL — AB (ref 6.5–8.1)
Total Bilirubin: <0.1 mg/dL — ABNORMAL LOW (ref 0.3–1.2)

## 2015-01-02 MED ORDER — SIMVASTATIN 40 MG PO TABS
40.0000 mg | ORAL_TABLET | Freq: Every day | ORAL | Status: DC
  Administered 2015-01-03 (×2): 40 mg via ORAL
  Filled 2015-01-02 (×2): qty 1

## 2015-01-02 MED ORDER — OXYCODONE HCL 5 MG PO TABS: 5.0000 mg | ORAL_TABLET | ORAL | Status: DC | PRN

## 2015-01-02 MED ORDER — ONDANSETRON HCL 4 MG PO TABS: 4.0000 mg | ORAL_TABLET | Freq: Four times a day (QID) | ORAL | Status: DC | PRN

## 2015-01-02 MED ORDER — HEPARIN SODIUM (PORCINE) 5000 UNIT/ML IJ SOLN
5000.0000 [IU] | Freq: Three times a day (TID) | INTRAMUSCULAR | Status: DC
  Administered 2015-01-02 – 2015-01-03 (×2): 5000 [IU] via SUBCUTANEOUS
  Filled 2015-01-02 (×2): qty 1

## 2015-01-02 MED ORDER — INSULIN ASPART 100 UNIT/ML ~~LOC~~ SOLN
0.0000 [IU] | Freq: Three times a day (TID) | SUBCUTANEOUS | Status: DC
  Administered 2015-01-03: 2 [IU] via SUBCUTANEOUS
  Administered 2015-01-04: 3 [IU] via SUBCUTANEOUS
  Administered 2015-01-05: 1 [IU] via SUBCUTANEOUS
  Administered 2015-01-05: 2 [IU] via SUBCUTANEOUS
  Administered 2015-01-05: 5 [IU] via SUBCUTANEOUS
  Administered 2015-01-06: 2 [IU] via SUBCUTANEOUS
  Administered 2015-01-06: 3 [IU] via SUBCUTANEOUS
  Filled 2015-01-02: qty 1
  Filled 2015-01-02: qty 2
  Filled 2015-01-02 (×2): qty 3
  Filled 2015-01-02: qty 5
  Filled 2015-01-02 (×2): qty 2

## 2015-01-02 MED ORDER — ACETAMINOPHEN 325 MG PO TABS: 650.0000 mg | ORAL_TABLET | Freq: Four times a day (QID) | ORAL | Status: DC | PRN

## 2015-01-02 MED ORDER — HYDRALAZINE HCL 50 MG PO TABS
100.0000 mg | ORAL_TABLET | Freq: Three times a day (TID) | ORAL | Status: DC
  Administered 2015-01-03 – 2015-01-06 (×9): 100 mg via ORAL
  Filled 2015-01-02 (×9): qty 2

## 2015-01-02 MED ORDER — METOPROLOL TARTRATE 50 MG PO TABS
50.0000 mg | ORAL_TABLET | Freq: Two times a day (BID) | ORAL | Status: DC
  Administered 2015-01-03 – 2015-01-06 (×7): 50 mg via ORAL
  Filled 2015-01-02 (×7): qty 1

## 2015-01-02 MED ORDER — HYDRALAZINE HCL 20 MG/ML IJ SOLN
10.0000 mg | INTRAMUSCULAR | Status: DC | PRN
Start: 2015-01-02 — End: 2015-01-06
  Administered 2015-01-03 (×2): 10 mg via INTRAVENOUS
  Filled 2015-01-02 (×2): qty 1

## 2015-01-02 MED ORDER — FUROSEMIDE 10 MG/ML IJ SOLN
40.0000 mg | Freq: Every day | INTRAMUSCULAR | Status: DC
  Administered 2015-01-03 – 2015-01-06 (×4): 40 mg via INTRAVENOUS
  Filled 2015-01-02 (×4): qty 4

## 2015-01-02 MED ORDER — ONDANSETRON HCL 4 MG/2ML IJ SOLN: 4.0000 mg | Freq: Four times a day (QID) | INTRAMUSCULAR | Status: DC | PRN

## 2015-01-02 MED ORDER — ASPIRIN EC 325 MG PO TBEC
325.0000 mg | DELAYED_RELEASE_TABLET | Freq: Once | ORAL | Status: AC
  Administered 2015-01-02: 325 mg via ORAL
  Filled 2015-01-02: qty 1

## 2015-01-02 MED ORDER — MORPHINE SULFATE (PF) 2 MG/ML IV SOLN: 2.0000 mg | INTRAVENOUS | Status: DC | PRN

## 2015-01-02 MED ORDER — FUROSEMIDE 10 MG/ML IJ SOLN
40.0000 mg | Freq: Once | INTRAMUSCULAR | Status: AC
  Administered 2015-01-02: 40 mg via INTRAVENOUS
  Filled 2015-01-02: qty 4

## 2015-01-02 MED ORDER — DOXAZOSIN MESYLATE 4 MG PO TABS
8.0000 mg | ORAL_TABLET | Freq: Every day | ORAL | Status: DC
  Administered 2015-01-03 – 2015-01-06 (×4): 8 mg via ORAL
  Filled 2015-01-02 (×4): qty 2

## 2015-01-02 MED ORDER — HYDRALAZINE HCL 20 MG/ML IJ SOLN
5.0000 mg | Freq: Once | INTRAMUSCULAR | Status: AC
  Administered 2015-01-02: 5 mg via INTRAVENOUS
  Filled 2015-01-02: qty 1

## 2015-01-02 MED ORDER — SODIUM BICARBONATE 650 MG PO TABS
650.0000 mg | ORAL_TABLET | Freq: Two times a day (BID) | ORAL | Status: DC
  Administered 2015-01-03 – 2015-01-06 (×8): 650 mg via ORAL
  Filled 2015-01-02 (×8): qty 1

## 2015-01-02 MED ORDER — INSULIN DETEMIR 100 UNIT/ML ~~LOC~~ SOLN
38.0000 [IU] | Freq: Every day | SUBCUTANEOUS | Status: DC
  Administered 2015-01-03: 38 [IU] via SUBCUTANEOUS
  Filled 2015-01-02 (×3): qty 0.38

## 2015-01-02 MED ORDER — INSULIN ASPART 100 UNIT/ML ~~LOC~~ SOLN
0.0000 [IU] | Freq: Every day | SUBCUTANEOUS | Status: DC
  Administered 2015-01-03 – 2015-01-05 (×2): 2 [IU] via SUBCUTANEOUS
  Filled 2015-01-02 (×2): qty 2

## 2015-01-02 MED ORDER — IPRATROPIUM-ALBUTEROL 0.5-2.5 (3) MG/3ML IN SOLN: 3.0000 mL | RESPIRATORY_TRACT | Status: DC | PRN

## 2015-01-02 MED ORDER — ACETAMINOPHEN 650 MG RE SUPP: 650.0000 mg | Freq: Four times a day (QID) | RECTAL | Status: DC | PRN

## 2015-01-02 NOTE — H&P (Signed)
[facility] [place] NAME: Jason Aguilar    MR#:  [id number]  DATE OF BIRTH:  [birth year]   DATE OF ADMISSION:  01/02/2015  PRIMARY CARE PHYSICIAN: Pcp Not In System   REQUESTING/REFERRING PHYSICIAN: Mariea Clonts  CHIEF COMPLAINT:   Chief Complaint  Patient presents with  . Shortness of Breath    HISTORY OF PRESENT ILLNESS:  Jason Aguilar  is a 79 y.o. male with a known history of chronic kidney disease unknown baseline however supposedly supposed to start dialysis soon presenting with shortness of breath. He describes personally 1 month's duration of shortness of breath however progressively worsening of the last 2 week duration. He mainly describes dyspnea on exertion, worsening lower extremity edema as well as orthopnea dry nonproductive cough denies any chest pain fevers chills further symptomatology. Given above symptoms present to Hospital further workup and evaluation  PAST MEDICAL HISTORY:   Past Medical History  Diagnosis Date  . Glaucoma     both eyes Drs. Arrie [place] and [place]  . Hematuria syndrome     s/p negative cystoscopy  . GERD (gastroesophageal reflux disease) 2003    EGD  . Allergy   . Hyperlipidemia   . Obstructive sleep apnea of adult     CPAP at 8 cm H20  . Noncompliance with medication regimen   . 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 by [facility]: low grade noninvasive  . Chronic kidney disease (CKD), stage III (moderate)     with proteinuria  . Glaucoma   . Hypertension   . Diabetes mellitus     Type 2  . Cataract due to secondary diabetes mellitus ([place])   . COPD (chronic obstructive pulmonary disease) ([place])     secondary to tobacco abuse  . History of tobacco abuse     60 pack year history; quit 1980    PAST SURGICAL HISTORY:   Past Surgical History  Procedure Laterality Date  . Elbow surgery    . Transurethral resection of bladder tumor  Mar 13 2011    [place]    SOCIAL  HISTORY:   Social History  Substance Use Topics  . Smoking status: Former Smoker    Quit date: 10/18/1980  . Smokeless tobacco: Never Used  . Alcohol Use: 1.8 oz/week    3 Cans of beer per week    FAMILY HISTORY:   Family History  Problem Relation Age of Onset  . Diabetes Mother   . Heart disease Father     DRUG ALLERGIES:   Allergies  Allergen Reactions  . Losartan Swelling  . Dristan Rash    REVIEW OF SYSTEMS:  REVIEW OF SYSTEMS:  CONSTITUTIONAL: Denies fevers, chills, positive fatigue, weakness.  EYES: Denies blurred vision, double vision, or eye pain.  EARS, NOSE, THROAT: Denies tinnitus, ear pain, hearing loss.  RESPIRATORY: Positive cough, shortness of breath, denies wheezing  CARDIOVASCULAR: Denies chest pain, palpitations, positive edema.  GASTROINTESTINAL: Denies nausea, vomiting, diarrhea, abdominal pain.  GENITOURINARY: Denies dysuria, hematuria.  ENDOCRINE: Denies nocturia or thyroid problems. HEMATOLOGIC AND LYMPHATIC: Denies easy bruising or bleeding.  SKIN: Denies rash or lesions.  MUSCULOSKELETAL: Denies pain in neck, back, shoulder, knees, hips, or further arthritic symptoms.  NEUROLOGIC: Denies paralysis, paresthesias.  PSYCHIATRIC: Denies anxiety or depressive symptoms. Otherwise full review of systems performed by me is negative.   MEDICATIONS AT HOME:   Prior to Admission medications   Medication Sig Start Date End Date Taking? Authorizing  Provider  doxazosin (CARDURA) 8 MG tablet Take 8 mg by mouth daily.   Yes Historical Provider, MD  furosemide (LASIX) 20 MG tablet Take 20 mg by mouth daily.   Yes Historical Provider, MD  hydrALAZINE (APRESOLINE) 100 MG tablet Take 100 mg by mouth 3 (three) times daily.   Yes Historical Provider, MD  insulin detemir (LEVEMIR) 100 UNIT/ML injection Inject 38 Units into the skin at bedtime.   Yes Historical Provider, MD  metoprolol (LOPRESSOR) 50 MG tablet Take 50 mg by mouth 2 (two) times daily.     Yes  Historical Provider, MD  simvastatin (ZOCOR) 40 MG tablet Take 1 tablet (40 mg total) by mouth at bedtime. 05/29/12  Yes Crecencio Mc, MD  sodium bicarbonate 650 MG tablet Take 650 mg by mouth 2 (two) times daily.   Yes Historical Provider, MD      VITAL SIGNS:  Blood pressure 200/65, pulse 67, temperature 97.6 F (36.4 C), temperature source Oral, resp. rate 24, height 5\' 5"  (1.651 m), weight 180 lb 1.9 oz (81.7 kg), SpO2 98 %.  PHYSICAL EXAMINATION:  VITAL SIGNS: Filed Vitals:   01/02/15 1828 01/02/15 2052  BP: 172/78 200/65  Pulse: 72 67  Temp: 97.6 F (36.4 C)   Resp: 20 24   GENERAL:79 y.o.male currently in no acute distress.  HEAD: Normocephalic, atraumatic.  EYES: Pupils equal, round, reactive to light. Extraocular muscles intact. No scleral icterus.  MOUTH: Moist mucosal membrane. Dentition intact. No abscess noted.  EAR, NOSE, THROAT: Clear without exudates. No external lesions.  NECK: Supple. No thyromegaly. No nodules. No JVD.  PULMONARY: Basal crackles without wheeze rails or rhonci. No use of accessory muscles, Good respiratory effort. good air entry bilaterally CHEST: Nontender to palpation.  CARDIOVASCULAR: S1 and S2. Regular rate and rhythm. No murmurs, rubs, or gallops.3 + edema. Pedal pulses 2+ bilaterally.  GASTROINTESTINAL: Soft, nontender, nondistended. No masses. Positive bowel sounds. No hepatosplenomegaly.  MUSCULOSKELETAL: No swelling, clubbing, or edema. Range of motion full in all extremities.  NEUROLOGIC: Cranial nerves II through XII are intact. No gross focal neurological deficits. Sensation intact. Reflexes intact.  SKIN: No ulceration, lesions, rashes, or cyanosis. Skin warm and dry. Turgor intact.  PSYCHIATRIC: Mood, affect within normal limits. The patient is awake, alert and oriented x 3. Insight, judgment intact.    LABORATORY PANEL:   CBC No results for input(s): WBC, HGB, HCT, PLT in the last 168  hours. ------------------------------------------------------------------------------------------------------------------  Chemistries   Recent Labs Lab 01/02/15 1924  NA 141  K 4.9  CL 108  CO2 23  GLUCOSE 306*  BUN 75*  CREATININE 7.94*  CALCIUM 8.0*  AST 10*  ALT 11*  ALKPHOS 57  BILITOT <0.1*   ------------------------------------------------------------------------------------------------------------------  Cardiac Enzymes  Recent Labs Lab 01/02/15 1924  TROPONINI 0.04*   ------------------------------------------------------------------------------------------------------------------  RADIOLOGY:  Dg Chest 2 View  01/02/2015  CLINICAL DATA:  Shortness of breath x 2-3 weeks EXAM: CHEST  2 VIEW COMPARISON:  09/17/2013 FINDINGS: Small bilateral pleural effusions, left greater than right. Pulmonary vascular congestion without frank interstitial edema. Mild patchy bilateral lower lobe opacities, likely atelectasis. No pneumothorax. Cardiomegaly. IMPRESSION: Small bilateral pleural effusions, left greater than right. Mild patchy bilateral lower lobe opacities, likely atelectasis. Electronically Signed   By: Julian Hy M.D.   On: 01/02/2015 20:50    EKG:   Orders placed or performed during the hospital encounter of 01/02/15  . ED EKG  . ED EKG    IMPRESSION AND PLAN:  79 year old Caucasian gentleman history of chronic kidney disease unknown baseline presenting with shortness of breath  1. Hypertensive emergency with congestive heart failure symptoms: Continue home medications for blood pressure and IV hydralazine, continue with diuresis will increase IV diuretic follow daily weights and ins and outs supplemental oxygen, breathing treatments, echocardiogram, consult nephrology for non-urgent dialysis 2. Type 2 diabetes insulin requiring: Continue basal insulin at insulin sliding scale 3. Hyperlipidemia unspecified statin therapy 4. Venous thrombi embolism  prophylactic: Heparin subcutaneous    All the records are reviewed and case discussed with ED provider. Management plans discussed with the patient, family and they are in agreement.  CODE STATUS: Full  TOTAL TIME TAKING CARE OF THIS PATIENT: 35 minutes.    Hower,  Karenann Cai.D on 01/02/2015 at 10:03 PM  Between 7am to 6pm - Pager - [phone]  After 6pm: House Pager: - [phone]  [facility]  Office  [phone]  CC: Primary care physician; Pcp Not In System

## 2015-01-02 NOTE — ED Notes (Signed)
Pt reports SHOB X 2-3 weeks. Supposed to get a line to start dialysis soon. No O2 at home at baseline.

## 2015-01-02 NOTE — ED Provider Notes (Signed)
[facility] Emergency Department Provider Note  ____________________________________________  Time seen: Approximately 6:55 PM  I have reviewed the triage vital signs and the nursing notes.   HISTORY  Chief Complaint Shortness of Breath    HPI Jason Aguilar is a 79 y.o. male the history of COPD not on O2, CK D, HTN, DM presenting with shortness of breath. Patient states that over the last 2-3 weeks he has had progressively worsening shortness of breath. He has noted bilateral lower extremity swelling and swelling of the tissue of his abdomen. He has had a nonproductive cough without any fever or chills. No rhinorrhea, sore throat or ear pain. He has not had any chest pain or palpitations, lightheadedness or syncope.   Past Medical History  Diagnosis Date  . Glaucoma     both eyes Drs. Arrie [place] and [place]  . Hematuria syndrome     s/p negative cystoscopy  . GERD (gastroesophageal reflux disease) 2003    EGD  . Allergy   . Hyperlipidemia   . Obstructive sleep apnea of adult     CPAP at 8 cm H20  . Noncompliance with medication regimen   . 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 by [facility]: low grade noninvasive  . Chronic kidney disease (CKD), stage III (moderate)     with proteinuria  . Glaucoma   . Hypertension   . Diabetes mellitus     Type 2  . Cataract due to secondary diabetes mellitus ([place])   . COPD (chronic obstructive pulmonary disease) ([place])     secondary to tobacco abuse  . History of tobacco abuse     60 pack year history; quit 1980    Patient Active Problem List   Diagnosis Date Noted  . Chest pain, localized 09/02/2011  . Adrenal nodule ([place]) 07/04/2011  . Chronic kidney disease (CKD), stage III (moderate)   . Cataract due to secondary diabetes mellitus ([place])   . Glaucoma   . COPD (chronic obstructive pulmonary disease) ([place])   . History of tobacco abuse   . Gout attack 04/12/2011  . Back pain, lumbosacral  04/07/2011  . Diabetes mellitus type II, uncontrolled ([place]) 04/07/2011  . transitional cell, bladder   . Nephropathy due to secondary diabetes mellitus ([place]) 10/21/2010  . Hypertension   . Hyperlipidemia   . Obstructive sleep apnea of adult   . Noncompliance with medication regimen     Past Surgical History  Procedure Laterality Date  . Elbow surgery    . Transurethral resection of bladder tumor  Mar 13 2011    [facility]    Current Outpatient Rx  Name  Route  Sig  Dispense  Refill  . aspirin 81 MG tablet   Oral   Take 81 mg by mouth daily.           Marland Kitchen doxazosin (CARDURA) 4 MG tablet   Oral   Take 4 mg by mouth daily.         . furosemide (LASIX) 40 MG tablet   Oral   Take 1 tablet (40 mg total) by mouth 2 (two) times daily.   60 tablet   5   . glipiZIDE (GLUCOTROL) 5 MG tablet   Oral   Take 1 tablet (5 mg total) by mouth daily.   30 tablet   3   . EXPIRED: hydrALAZINE (APRESOLINE) 50 MG tablet   Oral   Take 1 tablet (50 mg total) by mouth 3 (three) times daily.  90 tablet   5   . insulin aspart (NOVOLOG) 100 UNIT/ML injection      5 units for cbg 150 but less than 200 8 units for cbg > 200 but less than 240 10 units for cbg > 240 but less than 280 12 units for cbg > 280 but less than 300   3 mL   12     5 units for cbg 150 but less than 200 8 units for ...   . insulin detemir (LEVEMIR) 100 UNIT/ML injection   Subcutaneous   Inject 40 Units into the skin at bedtime.   3 mL   6     Please give enough pens to last one month (he will ...   . Insulin Pen Needle 31G X 5 MM MISC      Inject insulin once a day   100 each   5   . Lactulose 20 GM/30ML SOLN   Oral   Take 30 mLs (20 g total) by mouth every 4 (four) hours. unitl constipation is relieved   240 mL   2   . latanoprost (XALATAN) 0.005 % ophthalmic solution   Ophthalmic   Apply 1 drop to eye at bedtime.         Marland Kitchen losartan (COZAAR) 100 MG tablet      TAKE ONE (1) TABLET EACH DAY    30 tablet   5   . metoprolol (LOPRESSOR) 50 MG tablet   Oral   Take 50 mg by mouth 2 (two) times daily.           Marland Kitchen [facility]      Patient test blood sugar twice daily   100 each   5   . omeprazole (PRILOSEC) 20 MG capsule   Oral   Take 1 capsule (20 mg total) by mouth daily.   30 capsule   3   . simvastatin (ZOCOR) 40 MG tablet   Oral   Take 1 tablet (40 mg total) by mouth at bedtime.   30 tablet   0     Patient must schedule office visit for further ref ...   . travoprost, benzalkonium, (TRAVATAN) 0.004 % ophthalmic solution      One drop both eyes at bedtime.           Allergies Dristan  Family History  Problem Relation Age of Onset  . Diabetes Mother   . Heart disease Father     Social History Social History  Substance Use Topics  . Smoking status: Former Smoker    Quit date: 10/18/1980  . Smokeless tobacco: Never Used  . Alcohol Use: 1.8 oz/week    3 Cans of beer per week    Review of Systems Constitutional: No fever/chills. No lightheadedness or syncopal BP. Eyes: No visual changes. ENT: No sore throat. Cardiovascular: Denies chest pain, palpitations. Respiratory: Positive shortness of breath.  Nonproductive cough. Gastrointestinal: No abdominal pain. Positive abdominal swelling.  No nausea, no vomiting.  No diarrhea.  No constipation. Genitourinary: Negative for dysuria. Musculoskeletal: Negative for back pain. Positive bilateral lower extremity edema. Skin: Negative for rash. Neurological: Negative for headaches, focal weakness or numbness.  10-point ROS otherwise negative.  ____________________________________________   PHYSICAL EXAM:  VITAL SIGNS: ED Triage Vitals  Enc Vitals Group     BP 01/02/15 1828 172/78 mmHg     Pulse Rate 01/02/15 1828 72     Resp 01/02/15 1828 20     Temp 01/02/15 1828 97.6  F (36.4 C)     Temp Source 01/02/15 1828 Oral     SpO2 01/02/15 1828 97 %     Weight 01/02/15 1828 180 lb 1.9 oz  (81.7 kg)     Height 01/02/15 1828 5\' 5"  (1.651 m)     Head Cir --      Peak Flow --      Pain Score --      Pain Loc --      Pain Edu? --      Excl. in [place]? --     Constitutional: Alert and oriented. Well appearing and in no acute distress. Answer question appropriately. Eyes: Conjunctivae are normal.  EOMI. no discharge. Head: Atraumatic. Nose: No congestion/rhinnorhea. Mouth/Throat: Mucous membranes are moist.  Neck: No stridor.  Supple.  No JVD. Cardiovascular: Normal rate, regular rhythm. No murmurs, rubs or gallops.  Respiratory: Patient is tachypnea without accessory muscle use. He is able to speak in full sentences.  No retractions. Lungs CTAB.  No wheezes, rales or ronchi. Gastrointestinal: Soft and nontender. Diffuse edema throughout the abdominal wall. Mild distention. No peritoneal signs. Musculoskeletal: Positive bilateral lower extremity edema that is pitting to above the knee. No palpable cords or tenderness to palpation in the calf. Neurologic:  Normal speech and language. No gross focal neurologic deficits are appreciated.  Skin:  Skin is warm, dry and intact. No rash noted. Psychiatric: Mood and affect are normal. Speech and behavior are normal.  Normal judgement.  ____________________________________________   LABS (all labs ordered are listed, but only abnormal results are displayed)  Labs Reviewed  BASIC METABOLIC PANEL - Abnormal; Notable for the following:    Glucose, Bld 306 (*)    BUN 75 (*)    Creatinine, Ser 7.94 (*)    Calcium 8.0 (*)    GFR calc non Af Amer 6 (*)    GFR calc Af Amer 7 (*)    All other components within normal limits  TROPONIN I - Abnormal; Notable for the following:    Troponin I 0.04 (*)    All other components within normal limits  HEPATIC FUNCTION PANEL - Abnormal; Notable for the following:    Total Protein 5.4 (*)    Albumin 3.0 (*)    AST 10 (*)    ALT 11 (*)    Total Bilirubin <0.1 (*)    Bilirubin, Direct <0.1 (*)     All other components within normal limits  BRAIN NATRIURETIC PEPTIDE - Abnormal; Notable for the following:    B Natriuretic Peptide 1454.0 (*)    All other components within normal limits  CBC WITH DIFFERENTIAL/PLATELET  BLOOD GAS, VENOUS   ____________________________________________  EKG  ED ECG REPORT I, Eula Listen, the attending physician, personally viewed and interpreted this ECG.   Date: 01/02/2015  EKG Time: 1826  Rate: 72  Rhythm: normal sinus rhythm  Axis: Normal  Intervals:none  ST&T Change: No ST changes. No STEMI.  ____________________________________________  RADIOLOGY  Dg Chest 2 View  01/02/2015  CLINICAL DATA:  Shortness of breath x 2-3 weeks EXAM: CHEST  2 VIEW COMPARISON:  09/17/2013 FINDINGS: Small bilateral pleural effusions, left greater than right. Pulmonary vascular congestion without frank interstitial edema. Mild patchy bilateral lower lobe opacities, likely atelectasis. No pneumothorax. Cardiomegaly. IMPRESSION: Small bilateral pleural effusions, left greater than right. Mild patchy bilateral lower lobe opacities, likely atelectasis. Electronically Signed   By: Julian Hy M.D.   On: 01/02/2015 20:50    ____________________________________________   PROCEDURES  Procedure(s) performed: None  Critical Care performed: No ____________________________________________   INITIAL IMPRESSION / ASSESSMENT AND PLAN / ED COURSE  Pertinent labs & imaging results that were available during my care of the patient were reviewed by me and considered in my medical decision making (see chart for details).  79 y.o. male with a history of COPD, HTN, DM presenting with fluid overload and progressive shortness of breath. The patient does not have a history of congestive heart failure but this is on the differential. Also consider COPD exacerbation with peripheral fluid overload that is unrelated. There is no clinical suggestion of pneumonia at this  time. I will also rule out ACS or MI although his EKG does not show any ischemic changes.  ----------------------------------------- 9:12 PM on 01/02/2015 -----------------------------------------  The patient's clinical picture, chest x-ray findings, and BNP are concerning for new onset CHF; I do not see that he has ever carried this diagnosis in the past. Does have a positive troponin which may be a result of strain, but this will need to be trended. He is not having any active chest pain. I will treat the patient with diuretics and plan admission to the hospital.  ____________________________________________  FINAL CLINICAL IMPRESSION(S) / ED DIAGNOSES  Final diagnoses:  Acute renal failure, unspecified acute renal failure type ([place])  Elevated troponin  Hyperglycemia  Acute congestive heart failure, unspecified congestive heart failure type ([place])      NEW MEDICATIONS STARTED DURING THIS VISIT:  New Prescriptions   No medications on file     Eula Listen, MD 01/02/15 2113

## 2015-01-03 ENCOUNTER — Encounter: Payer: Self-pay | Admitting: *Deleted

## 2015-01-03 ENCOUNTER — Other Ambulatory Visit: Payer: Self-pay | Admitting: Vascular Surgery

## 2015-01-03 ENCOUNTER — Inpatient Hospital Stay
Admit: 2015-01-03 | Discharge: 2015-01-03 | Disposition: A | Discharge status: Home / Self Care | Payer: Medicare Other | Attending: Internal Medicine | Admitting: Internal Medicine

## 2015-01-03 ENCOUNTER — Encounter
Admission: EM | Disposition: A | Discharge status: Home / Self Care | Payer: Self-pay | Source: Home / Self Care | Attending: Internal Medicine

## 2015-01-03 HISTORY — PX: PERIPHERAL VASCULAR CATHETERIZATION: SHX172C

## 2015-01-03 LAB — RENAL FUNCTION PANEL
ALBUMIN: 2.8 g/dL — AB (ref 3.5–5.0)
Anion gap: 13 (ref 5–15)
BUN: 76 mg/dL — AB (ref 6–20)
CALCIUM: 7.9 mg/dL — AB (ref 8.9–10.3)
CHLORIDE: 108 mmol/L (ref 101–111)
CO2: 21 mmol/L — ABNORMAL LOW (ref 22–32)
CREATININE: 8.04 mg/dL — AB (ref 0.61–1.24)
GFR, EST AFRICAN AMERICAN: 7 mL/min — AB (ref >60)
GFR, EST NON AFRICAN AMERICAN: 6 mL/min — AB (ref >60)
Glucose, Bld: 190 mg/dL — ABNORMAL HIGH (ref 65–99)
PHOSPHORUS: 8.2 mg/dL — AB (ref 2.5–4.6)
Potassium: 4.9 mmol/L (ref 3.5–5.1)
SODIUM: 142 mmol/L (ref 135–145)

## 2015-01-03 LAB — FERRITIN: Ferritin: 203 ng/mL (ref 24–336)

## 2015-01-03 LAB — CBC
HCT: 26.4 % — ABNORMAL LOW (ref 40.0–52.0)
HCT: 25.6 % — ABNORMAL LOW (ref 40.0–52.0)
Hemoglobin: 8.6 g/dL — ABNORMAL LOW (ref 13.0–18.0)
Hemoglobin: 8.5 g/dL — ABNORMAL LOW (ref 13.0–18.0)
MCH: 28.6 pg (ref 26.0–34.0)
MCH: 29.3 pg (ref 26.0–34.0)
MCHC: 32.6 g/dL (ref 32.0–36.0)
MCHC: 33.1 g/dL (ref 32.0–36.0)
MCV: 87.9 fL (ref 80.0–100.0)
MCV: 88.7 fL (ref 80.0–100.0)
PLATELETS: 189 10*3/uL (ref 150–440)
Platelets: 183 10*3/uL (ref 150–440)
RBC: 3 MIL/uL — ABNORMAL LOW (ref 4.40–5.90)
RBC: 2.89 MIL/uL — AB (ref 4.40–5.90)
RDW: 15.3 % — ABNORMAL HIGH (ref 11.5–14.5)
RDW: 15.1 % — AB (ref 11.5–14.5)
WBC: 8.2 10*3/uL (ref 3.8–10.6)
WBC: 8.4 10*3/uL (ref 3.8–10.6)

## 2015-01-03 LAB — IRON AND TIBC
IRON: 40 ug/dL — AB (ref 45–182)
Saturation Ratios: 23 % (ref 17.9–39.5)
TIBC: 173 ug/dL — AB (ref 250–450)
UIBC: 134 ug/dL

## 2015-01-03 LAB — HEMOGLOBIN A1C: Hgb A1c MFr Bld: 7.7 % — ABNORMAL HIGH (ref 4.0–6.0)

## 2015-01-03 LAB — GLUCOSE, CAPILLARY
GLUCOSE-CAPILLARY: 129 mg/dL — AB (ref 65–99)
Glucose-Capillary: 195 mg/dL — ABNORMAL HIGH (ref 65–99)
Glucose-Capillary: 131 mg/dL — ABNORMAL HIGH (ref 65–99)

## 2015-01-03 LAB — BASIC METABOLIC PANEL
ANION GAP: 10 (ref 5–15)
BUN: 75 mg/dL — ABNORMAL HIGH (ref 6–20)
CHLORIDE: 108 mmol/L (ref 101–111)
CO2: 22 mmol/L (ref 22–32)
Calcium: 7.6 mg/dL — ABNORMAL LOW (ref 8.9–10.3)
Creatinine, Ser: 8.2 mg/dL — ABNORMAL HIGH (ref 0.61–1.24)
GFR, EST AFRICAN AMERICAN: 6 mL/min — AB (ref >60)
GFR, EST NON AFRICAN AMERICAN: 6 mL/min — AB (ref >60)
Glucose, Bld: 143 mg/dL — ABNORMAL HIGH (ref 65–99)
POTASSIUM: 4.4 mmol/L (ref 3.5–5.1)
SODIUM: 140 mmol/L (ref 135–145)

## 2015-01-03 LAB — BLOOD GAS, VENOUS
Acid-base deficit: 1.9 mmol/L (ref 0.0–2.0)
BICARBONATE: 22.2 meq/L (ref 21.0–28.0)
PCO2 VEN: 35 mmHg — AB (ref 44.0–60.0)
PH VEN: 7.41 (ref 7.320–7.430)
Patient temperature: 37

## 2015-01-03 LAB — PHOSPHORUS: PHOSPHORUS: 7.7 mg/dL — AB (ref 2.5–4.6)

## 2015-01-03 LAB — TROPONIN I
TROPONIN I: 0.05 ng/mL — AB (ref <0.031)
TROPONIN I: 0.03 ng/mL (ref <0.031)
Troponin I: 0.03 ng/mL (ref <0.031)

## 2015-01-03 SURGERY — DIALYSIS/PERMA CATHETER INSERTION
Anesthesia: Moderate Sedation

## 2015-01-03 MED ORDER — SODIUM CHLORIDE 0.9 % IV SOLN
INTRAVENOUS | Status: DC
  Administered 2015-01-03: 15:00:00 via INTRAVENOUS

## 2015-01-03 MED ORDER — CEFAZOLIN SODIUM 1-5 GM-% IV SOLN
1.0000 g | INTRAVENOUS | Status: DC
  Filled 2015-01-03: qty 50

## 2015-01-03 MED ORDER — MIDAZOLAM HCL 2 MG/2ML IJ SOLN
INTRAMUSCULAR | Status: AC
  Filled 2015-01-03: qty 2

## 2015-01-03 MED ORDER — HEPARIN (PORCINE) IN NACL 2-0.9 UNIT/ML-% IJ SOLN
INTRAMUSCULAR | Status: AC
  Filled 2015-01-03: qty 500

## 2015-01-03 MED ORDER — TUBERCULIN PPD 5 UNIT/0.1ML ID SOLN
5.0000 [IU] | Freq: Once | INTRADERMAL | Status: DC
  Filled 2015-01-03: qty 0.1

## 2015-01-03 MED ORDER — LIDOCAINE-EPINEPHRINE (PF) 1 %-1:200000 IJ SOLN
INTRAMUSCULAR | Status: AC
  Filled 2015-01-03: qty 30

## 2015-01-03 MED ORDER — CETYLPYRIDINIUM CHLORIDE 0.05 % MT LIQD: 7.0000 mL | Freq: Two times a day (BID) | OROMUCOSAL | Status: DC

## 2015-01-03 MED ORDER — GELATIN ABSORBABLE 12-7 MM EX MISC
CUTANEOUS | Status: AC
  Filled 2015-01-03: qty 1

## 2015-01-03 MED ORDER — MIDAZOLAM HCL 2 MG/2ML IJ SOLN
INTRAMUSCULAR | Status: DC | PRN
  Administered 2015-01-03: 1 mg

## 2015-01-03 MED ORDER — LIDOCAINE HCL (PF) 1 % IJ SOLN
INTRAMUSCULAR | Status: AC
  Filled 2015-01-03: qty 30

## 2015-01-03 MED ORDER — INSULIN ASPART 100 UNIT/ML ~~LOC~~ SOLN: 0.0000 [IU] | SUBCUTANEOUS | Status: DC

## 2015-01-03 MED ORDER — HEPARIN SODIUM (PORCINE) 10000 UNIT/ML IJ SOLN
INTRAMUSCULAR | Status: AC
  Filled 2015-01-03: qty 1

## 2015-01-03 MED ORDER — CHLORHEXIDINE GLUCONATE 0.12 % MT SOLN
15.0000 mL | Freq: Two times a day (BID) | OROMUCOSAL | Status: DC
  Administered 2015-01-03 (×2): 15 mL via OROMUCOSAL
  Filled 2015-01-03: qty 15

## 2015-01-03 MED ORDER — DEXTROSE 5 % IV SOLN
1.5000 g | INTRAVENOUS | Status: DC
  Filled 2015-01-03: qty 1.5

## 2015-01-03 MED ORDER — HEPARIN SODIUM (PORCINE) 5000 UNIT/ML IJ SOLN
5000.0000 [IU] | Freq: Two times a day (BID) | INTRAMUSCULAR | Status: DC
  Administered 2015-01-04 – 2015-01-06 (×4): 5000 [IU] via SUBCUTANEOUS
  Filled 2015-01-03 (×4): qty 1

## 2015-01-03 MED ORDER — FENTANYL CITRATE (PF) 100 MCG/2ML IJ SOLN
INTRAMUSCULAR | Status: AC
  Filled 2015-01-03: qty 2

## 2015-01-03 MED ORDER — FENTANYL CITRATE (PF) 100 MCG/2ML IJ SOLN
INTRAMUSCULAR | Status: DC | PRN
  Administered 2015-01-03: 25 ug

## 2015-01-03 MED ORDER — AMLODIPINE BESYLATE 10 MG PO TABS
10.0000 mg | ORAL_TABLET | Freq: Every day | ORAL | Status: DC
  Administered 2015-01-04 – 2015-01-06 (×3): 10 mg via ORAL
  Filled 2015-01-03 (×3): qty 1

## 2015-01-03 SURGICAL SUPPLY — 3 items
CANNULA 5F STIFF (CANNULA) ×3 IMPLANT
CATH PALINDROME RT-P 15FX19CM (CATHETERS) ×3 IMPLANT
PACK ANGIOGRAPHY (CUSTOM PROCEDURE TRAY) ×3 IMPLANT

## 2015-01-03 NOTE — Progress Notes (Signed)
[facility]  ROUNDING NOTE   Subjective:   Patient with known CKD stage V presents for shortness of breath and uremic symptoms. Seems that he does not have HD access and followed by [facility]. With nausea, shortness of breath, fatigue and poor appetite.  Creatinine 8.2, eGFR of 6. BUN 75.  UOP 1050 - furosemide 41m IV times three   Objective:  Vital signs in last 24 hours:  Temp:  [97.4 F (36.3 C)-98 F (36.7 C)] 98 F (36.7 C) (12/06 0744) Pulse Rate:  [64-77] 71 (12/06 0744) Resp:  [18-24] 18 (12/06 0744) BP: (172-200)/(45-78) 188/61 mmHg (12/06 0744) SpO2:  [96 %-98 %] 97 % (12/06 0744) Weight:  [77.883 kg (171 lb 11.2 oz)-81.7 kg (180 lb 1.9 oz)] 77.883 kg (171 lb 11.2 oz) (12/06 0447)  Weight change:  Filed Weights   01/02/15 1828 01/02/15 2301 01/03/15 0447  Weight: 81.7 kg (180 lb 1.9 oz) 77.883 kg (171 lb 11.2 oz) 77.883 kg (171 lb 11.2 oz)    Intake/Output: I/O last 3 completed shifts: In: -  Out: 1050 [Urine:1050]   Intake/Output this shift:  Total I/O In: 240 [P.O.:240] Out: 150 [Urine:150]  Physical Exam: General: NAD, elderly, frail  Head: Normocephalic, atraumatic. Moist oral mucosal membranes, hard of hearing  Eyes: Anicteric, PERRL  Neck: Supple, trachea midline  Lungs:  Bilateral diffuse crackles  Heart: Regular rate and rhythm  Abdomen:  Soft, nontender,   Extremities: 1+ peripheral edema.  Neurologic: Nonfocal, moving all four extremities  Skin: No lesions  Access: none    Basic Metabolic Panel:  Recent Labs Lab 01/02/15 1924 01/03/15 0538  NA 141 140  K 4.9 4.4  CL 108 108  CO2 23 22  GLUCOSE 306* 143*  BUN 75* 75*  CREATININE 7.94* 8.20*  CALCIUM 8.0* 7.6*    Liver Function Tests:  Recent Labs Lab 01/02/15 1924  AST 10*  ALT 11*  ALKPHOS 57  BILITOT <0.1*  PROT 5.4*  ALBUMIN 3.0*   No results for input(s): LIPASE, AMYLASE in the last 168 hours. No results for input(s): AMMONIA in the last 168  hours.  CBC:  Recent Labs Lab 01/02/15 1924 01/03/15 0538  WBC 9.7 8.2  NEUTROABS 7.7*  --   HGB 9.6* 8.6*  HCT 29.2* 26.4*  MCV 88.5 87.9  PLT 205 183    Cardiac Enzymes:  Recent Labs Lab 01/02/15 1924 01/02/15 2342 01/03/15 0538  TROPONINI 0.04* 0.03 0.05*    BNP: Invalid input(s): POCBNP  CBG:  Recent Labs Lab 01/02/15 2343 01/03/15 0729  GLUCAP 232* 129*    Microbiology: Results for orders placed or performed in visit on 07/24/13  Culture, blood (single)     Status: None   Collection Time: 07/24/13 12:53 AM  Result Value Ref Range Status   Micro Text Report   Final       ORGANISM 1                COAGULASE NEGATIVE STAPHYLOCOCCUS   COMMENT                   IN AEROBIC BOTTLE ONLY   COMMENT                   POSSIBLE CONTAMINATION W/SKIN FLORA   GRAM STAIN                GRAM POSITIVE COCCI IN CLUSTERS   ANTIBIOTIC  Culture, blood (single)     Status: None   Collection Time: 07/24/13 12:53 AM  Result Value Ref Range Status   Micro Text Report   Final       COMMENT                   NO GROWTH AEROBICALLY/ANAEROBICALLY IN 5 DAYS   ANTIBIOTIC                                                        Coagulation Studies: No results for input(s): LABPROT, INR in the last 72 hours.  Urinalysis: No results for input(s): COLORURINE, LABSPEC, PHURINE, GLUCOSEU, HGBUR, BILIRUBINUR, KETONESUR, PROTEINUR, UROBILINOGEN, NITRITE, LEUKOCYTESUR in the last 72 hours.  Invalid input(s): APPERANCEUR    Imaging: Dg Chest 2 View  01/02/2015  CLINICAL DATA:  Shortness of breath x 2-3 weeks EXAM: CHEST  2 VIEW COMPARISON:  09/17/2013 FINDINGS: Small bilateral pleural effusions, left greater than right. Pulmonary vascular congestion without frank interstitial edema. Mild patchy bilateral lower lobe opacities, likely atelectasis. No pneumothorax. Cardiomegaly. IMPRESSION: Small bilateral pleural effusions, left  greater than right. Mild patchy bilateral lower lobe opacities, likely atelectasis. Electronically Signed   By: Julian Hy M.D.   On: 01/02/2015 20:50     Medications:     . antiseptic oral rinse  7 mL Mouth Rinse q12n4p  . chlorhexidine  15 mL Mouth Rinse BID  . doxazosin  8 mg Oral Daily  . furosemide  40 mg Intravenous Daily  . heparin  5,000 Units Subcutaneous 3 times per day  . hydrALAZINE  100 mg Oral TID  . insulin aspart  0-5 Units Subcutaneous QHS  . insulin aspart  0-9 Units Subcutaneous TID WC  . insulin detemir  38 Units Subcutaneous QHS  . metoprolol  50 mg Oral BID  . simvastatin  40 mg Oral QHS  . sodium bicarbonate  650 mg Oral BID   acetaminophen **OR** acetaminophen, hydrALAZINE, ipratropium-albuterol, morphine injection, ondansetron **OR** ondansetron (ZOFRAN) IV, oxyCODONE  Assessment/ Plan:  Jason Aguilar is a 79 y.o. white male with hypertension, hyperlipidemia, diabetes mellitus type II Insulin Dependent, diabetic retinopathy, BPH, obstructive sleep apnea who presents Hyperglycemia [R73.9] Elevated troponin [R79.89] Secondary hypertension, unspecified [I15.9] Acute renal failure, unspecified acute renal failure type (HCC) [N17.9] Acute congestive heart failure, unspecified congestive heart failure type ([place]) [I50.9]  1. Chronic Kidney Disease stage V with metabolic acidosis: at this time point, will label end stage renal disease. Will initiate dialysis today. Will consult vascular for access placement.  - Check hepatitis panel, PPD - Contact primary nephrologist - Continue furosemide for now.  - Will need to start arranging outpatient placement.  - once start hemodialysis, will discontinue oral sodium bicarbonate.   2. Hypertension: not well controlled. Volume driven.  - doxazosin, furosemide, hydralazine, and metoprolol - not on an ACE-I/ARB due to advanced renal disease.   3. Diabetes Mellitus type II Insulin Dependent with chronic kidney  disease: continue glucose control. Hemoglobin A1c of 7.7%.   4. Anemia of chronic kidney disease:  Normocytic with hemoglobin of 8.6 - epo with dialysis treatment.  - check iron studies  5. Secondary Hyperparathyroidism: Corrected calcium at goal. Not currently on binders or activated vitamin D - Check PTH and phosphorus.    LOS: 1 Jason Aguilar  12/6/201610:27 AM

## 2015-01-03 NOTE — Plan of Care (Signed)
Problem: Safety: Goal: Ability to remain free from injury will improve Outcome: Progressing Fall precautions in place  Problem: Pain Managment: Goal: General experience of comfort will improve Outcome: Progressing Prn meds  Problem: Tissue Perfusion: Goal: Risk factors for ineffective tissue perfusion will decrease Outcome: Progressing SQ Heparin  Problem: Fluid Volume: Goal: Ability to maintain a balanced intake and output will improve Outcome: Progressing IV lasix, daily weights

## 2015-01-03 NOTE — Progress Notes (Signed)
*  PRELIMINARY RESULTS* Echocardiogram 2D Echocardiogram has been performed.  Jason Aguilar 01/03/2015, 10:58 AM

## 2015-01-03 NOTE — Progress Notes (Signed)
A&O. Admitted with shortness of breath. IV lasix given. O2 at 2L. Skin verified with [place].

## 2015-01-03 NOTE — Op Note (Signed)
OPERATIVE NOTE   PROCEDURE: 1. Insertion of tunneled dialysis catheter right internal jugular approach.  PRE-OPERATIVE DIAGNOSIS: Acute on chronic renal insufficiency  POST-OPERATIVE DIAGNOSIS: Same  SURGEON: Jearlene Bridwell, Dolores Lory.  ANESTHESIA: Conscious sedation with 1% lidocaine local infiltration  ESTIMATED BLOOD LOSS: Minimal cc  CONTRAST USED:  None  FLUOROSCOPY TIME:    INDICATIONS:   Jason A Cookis a 79 y.o. y.o. male who presents with worsening volume status congestive heart failure and worsening renal parameters. He is now to be admitted and started on urgent dialysis.Marland Kitchen  DESCRIPTION: After obtaining full informed written consent, the patient was positioned supine. The right neck and chest wall was prepped and draped in a sterile fashion. Ultrasound was placed in a sterile sleeve. Ultrasound was utilized to identify the right internal jugular vein which is noted to be echolucent and compressible indicating patency. Images recorded for the permanent record. Under real-time visualization a Seldinger needle is inserted into the vein and the guidewires advanced without difficulty. Small counterincision was made at the wire insertion site. Dilators are passed over the wire and the tunneled dialysis catheter is fed into the central venous system without difficulty.  Under fluoroscopy the catheter tip positioned at the atrial caval junction. The catheter is then approximated to the chest wall and an exit site selected. 1% lidocaine is infiltrated in soft tissues at this level small incision is made and the tunneling device is then passed from the exit site to the neck counterincision. Catheter is then connected to the tunneling device and the catheter was pulled subcutaneously. It is then transected and the hub assembly connected without difficulty. Both lumens aspirate and flush easily. After verification of smooth contour with proper tip position under fluoroscopy the catheter is packed  with 5000 units of heparin per lumen.  Catheter secured to the skin of the right chest with 0 silk. A sterile dressing is applied with a Biopatch.  COMPLICATIONS: None  CONDITION: Unchanged  Macie Baum, [place] [facility]. Office:  [phone]   01/03/2015,7:07 PM

## 2015-01-03 NOTE — Progress Notes (Signed)
Hydralazine 10mg  iv given for BP 191/66, will monitor.

## 2015-01-03 NOTE — Progress Notes (Signed)
Order for heparin 5000 units subcutaneously q 8 hours for DVT prophylaxis was changed to q 12 hours per anticoagulation protocol due to ESRD and initiation of HD.   Jason Aguilar 11:20 AM

## 2015-01-03 NOTE — Progress Notes (Signed)
[facility] [place] NAME: Jason Aguilar    MR#:  [id number]  DATE OF BIRTH:  10/19/1935  SUBJECTIVE:  CHIEF COMPLAINT:   Chief Complaint  Patient presents with  . Shortness of Breath   -Patient admitted with worsening shortness of breath. CKD stage V progress to end-stage renal disease and was in the process of setting up for outpatient dialysis. -Still complains of shortness of breath. Seen by nephrology and plan for placement of dialysis catheter and dialysis today  REVIEW OF SYSTEMS:  Review of Systems  Constitutional: Negative for fever and chills.  Respiratory: Positive for shortness of breath. Negative for cough and wheezing.   Cardiovascular: Positive for leg swelling. Negative for chest pain and palpitations.  Gastrointestinal: Negative for nausea, vomiting, abdominal pain, diarrhea and constipation.  Genitourinary: Negative for dysuria.  Musculoskeletal: Negative for myalgias and neck pain.  Neurological: Positive for weakness. Negative for dizziness, speech change, focal weakness, seizures and headaches.  Psychiatric/Behavioral: Negative for depression.    DRUG ALLERGIES:   Allergies  Allergen Reactions  . Losartan Swelling  . Dristan Rash    VITALS:  Blood pressure 202/68, pulse 72, temperature 97.9 F (36.6 C), temperature source Oral, resp. rate 30, height 5\' 5"  (1.651 m), weight 77.883 kg (171 lb 11.2 oz), SpO2 97 %.  PHYSICAL EXAMINATION:  Physical Exam  GENERAL:  79 y.o.-year-old obese patient lying in the bed, appears short of breath.  EYES: Pupils equal, round, reactive to light and accommodation. No scleral icterus. Extraocular muscles intact.  HEENT: Head atraumatic, normocephalic. Oropharynx and nasopharynx clear.  NECK:  Supple, no jugular venous distention. No thyroid enlargement, no tenderness.  LUNGS: Decreased bibasilar breath sounds. Fine bibasilar crackles heard. No wheezes or rhonchi heard.  Use of accessory muscles noted with minimal exertion. CARDIOVASCULAR: S1, S2 normal. No murmurs, rubs, or gallops.  ABDOMEN: Soft, nontender, nondistended. Bowel sounds present. No organomegaly or mass.  EXTREMITIES: No pedal edema, cyanosis, or clubbing.  NEUROLOGIC: Cranial nerves II through XII are intact. Muscle strength 5/5 in all extremities. Sensation intact. Gait not checked.  PSYCHIATRIC: The patient is alert and oriented x 3. Impaired Cognitive  ability SKIN: No obvious rash, lesion, or ulcer.    LABORATORY PANEL:   CBC  Recent Labs Lab 01/03/15 0538  WBC 8.2  HGB 8.6*  HCT 26.4*  PLT 183   ------------------------------------------------------------------------------------------------------------------  Chemistries   Recent Labs Lab 01/02/15 1924 01/03/15 0538  NA 141 140  K 4.9 4.4  CL 108 108  CO2 23 22  GLUCOSE 306* 143*  BUN 75* 75*  CREATININE 7.94* 8.20*  CALCIUM 8.0* 7.6*  AST 10*  --   ALT 11*  --   ALKPHOS 57  --   BILITOT <0.1*  --    ------------------------------------------------------------------------------------------------------------------  Cardiac Enzymes  Recent Labs Lab 01/03/15 1150  TROPONINI 0.03   ------------------------------------------------------------------------------------------------------------------  RADIOLOGY:  Dg Chest 2 View  01/02/2015  CLINICAL DATA:  Shortness of breath x 2-3 weeks EXAM: CHEST  2 VIEW COMPARISON:  09/17/2013 FINDINGS: Small bilateral pleural effusions, left greater than right. Pulmonary vascular congestion without frank interstitial edema. Mild patchy bilateral lower lobe opacities, likely atelectasis. No pneumothorax. Cardiomegaly. IMPRESSION: Small bilateral pleural effusions, left greater than right. Mild patchy bilateral lower lobe opacities, likely atelectasis. Electronically Signed   By: Julian Hy M.D.   On: 01/02/2015 20:50    EKG:   Orders placed or performed during the  hospital encounter of 01/02/15  .  ED EKG  . ED EKG    ASSESSMENT AND PLAN:   79 year old male with past medical history significant for CK D stage V following with [facility] nephrology, diabetes mellitus, sleep apnea, history of bladder cancer, COPD admitted for shortness of breath and noted to be in acute renal failure and also diastolic CHF exacerbation.   #1 acute pulmonary edema-secondary to acute diastolic CHF exacerbation and also poor renal function. -Admitted to telemetry, decreased urine output. -We will need dialysis. Echocardiogram done. Continue oxygen support for now.  #2 CKD stage V progressing to end-stage renal disease-follows with [facility] nephrology as outpatient. -Nephrology and vascular consults here. Will need to permacath versus a temporary dialysis catheter placement and dialysis - cause for ESRD- HTN and DM  #3 hypertension-poorly controlled hypertension as outpatient. Continue his home medications for now. Patient is on  metoprolol, hydralazine, Lasix, doxazosin and clonidine. Also recommended low-sodium diet. - add norvasc as well  #4 diabetes mellitus-insulin-dependent. On Lantus and also sliding scale insulin.  #5 history of bladder cell cancer-status post TURBT in 2013, no obstructive symptoms at this time. Follow up with urology as outpatient.  #6 anemia of chronic kidney disease-monitor for now. Likely epogen once dialysis is started.  #7 DVT Prophylaxis- Heparin SQ   All the records are reviewed and case discussed with Care Management/Social Workerr. Management plans discussed with the patient, family and they are in agreement.  CODE STATUS: Full code  TOTAL TIME TAKING CARE OF THIS PATIENT: 37 minutes.   POSSIBLE D/C IN 2-3 DAYS, DEPENDING ON CLINICAL CONDITION.   Jinnifer Montejano M.D on 01/03/2015 at 3:54 PM  Between 7am to 6pm - Pager - [phone]  After 6pm go to [email or web address] - password [email or web address]  Office   [phone]  CC: Primary care physician; Pcp Not In System

## 2015-01-03 NOTE — Progress Notes (Signed)
Back from CVT 

## 2015-01-03 NOTE — Care Management (Signed)
Discussed during progression of need to determine if present need for 02 is acute or chronic.

## 2015-01-03 NOTE — Consult Note (Signed)
[facility] Vascular Consult Note  MRN : [id number]  Jason Aguilar is a 79 y.o. (07-08-1935) male who presents with chief complaint of  Chief Complaint  Patient presents with  . Shortness of Breath  .  History of Present Illness: The patient is well known to our service with a known history of chronic kidney disease unknown baseline however supposedly supposed to start dialysis soon presenting with shortness of breath. He describes personally 1 month's duration of shortness of breath however progressively worsening of the last 2 week duration. He mainly describes dyspnea on exertion, worsening lower extremity edema as well as orthopnea dry nonproductive cough denies any chest pain fevers chills further symptomatology.  Given his symptoms nephrology has evaluated and determined he requires urgent dialysis. I am asked to provide access  Current Facility-Administered Medications  Medication Dose Route Frequency Provider Last Rate Last Dose  . acetaminophen (TYLENOL) tablet 650 mg  650 mg Oral Q6H PRN Lytle Butte, MD       Or  . acetaminophen (TYLENOL) suppository 650 mg  650 mg Rectal Q6H PRN Lytle Butte, MD      . amLODipine (NORVASC) tablet 10 mg  10 mg Oral Daily Gladstone Lighter, MD      . antiseptic oral rinse (CPC / CETYLPYRIDINIUM CHLORIDE 0.05%) solution 7 mL  7 mL Mouth Rinse q12n4p Lytle Butte, MD   7 mL at 01/03/15 1156  . [START ON 01/04/2015] ceFAZolin (ANCEF) IVPB 1 g/50 mL premix  1 g Intravenous On Call Katha Cabal, MD      . chlorhexidine (PERIDEX) 0.12 % solution 15 mL  15 mL Mouth Rinse BID Lytle Butte, MD   15 mL at 01/03/15 [id number]  . doxazosin (CARDURA) tablet 8 mg  8 mg Oral Daily Lytle Butte, MD   8 mg at 01/03/15 [id number]  . furosemide (LASIX) injection 40 mg  40 mg Intravenous Daily Lytle Butte, MD   40 mg at 01/03/15 [id number]  . heparin injection 5,000 Units  5,000 Units Subcutaneous Q12H Darylene Price [place], [facility]      . hydrALAZINE  (APRESOLINE) injection 10 mg  10 mg Intravenous Q4H PRN Lytle Butte, MD   10 mg at 01/03/15 1201  . hydrALAZINE (APRESOLINE) tablet 100 mg  100 mg Oral TID Lytle Butte, MD   100 mg at 01/03/15 [id number]  . insulin aspart (novoLOG) injection 0-5 Units  0-5 Units Subcutaneous QHS Lytle Butte, MD   2 Units at 01/03/15 0004  . insulin aspart (novoLOG) injection 0-9 Units  0-9 Units Subcutaneous TID WC Lytle Butte, MD   2 Units at 01/03/15 1152  . insulin detemir (LEVEMIR) injection 38 Units  38 Units Subcutaneous QHS Lytle Butte, MD   38 Units at 01/03/15 0004  . ipratropium-albuterol (DUONEB) 0.5-2.5 (3) MG/3ML nebulizer solution 3 mL  3 mL Nebulization Q4H PRN Lytle Butte, MD      . metoprolol (LOPRESSOR) tablet 50 mg  50 mg Oral BID Lytle Butte, MD   50 mg at 01/03/15 [id number]  . morphine 2 MG/ML injection 2 mg  2 mg Intravenous Q4H PRN Lytle Butte, MD      . ondansetron [facility]) tablet 4 mg  4 mg Oral Q6H PRN Lytle Butte, MD       Or  . ondansetron [facility]) injection 4 mg  4 mg Intravenous Q6H PRN Lytle Butte, MD      .  oxyCODONE (Oxy IR/ROXICODONE) immediate release tablet 5 mg  5 mg Oral Q4H PRN Lytle Butte, MD      . simvastatin (ZOCOR) tablet 40 mg  40 mg Oral QHS Lytle Butte, MD   40 mg at 01/03/15 0003  . sodium bicarbonate tablet 650 mg  650 mg Oral BID Lytle Butte, MD   650 mg at 01/03/15 [id number]  . tuberculin injection 5 Units  5 Units Intradermal Once Lavonia Dana, MD        Past Medical History  Diagnosis Date  . Glaucoma     both eyes Drs. Arrie [place] and [place]  . Hematuria syndrome     s/p negative cystoscopy  . GERD (gastroesophageal reflux disease) 2003    EGD  . Allergy   . Hyperlipidemia   . Obstructive sleep apnea of adult     CPAP at 8 cm H20  . Noncompliance with medication regimen   . 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 by [facility]: low grade noninvasive  . Chronic kidney disease (CKD), stage III (moderate)     with proteinuria  .  Glaucoma   . Hypertension   . Diabetes mellitus     Type 2  . Cataract due to secondary diabetes mellitus ([place])   . COPD (chronic obstructive pulmonary disease) ([place])     secondary to tobacco abuse  . History of tobacco abuse     60 pack year history; quit 1980    Past Surgical History  Procedure Laterality Date  . Elbow surgery    . Transurethral resection of bladder tumor  Mar 13 2011    [facility]    Social History Social History  Substance Use Topics  . Smoking status: Former Smoker    Quit date: 10/18/1980  . Smokeless tobacco: Never Used  . Alcohol Use: 1.8 oz/week    3 Cans of beer per week    Family History Family History  Problem Relation Age of Onset  . Diabetes Mother   . Heart disease Father    no family history of porphyria, autoimmune disease or bleeding clotting disorders  Allergies  Allergen Reactions  . Losartan Swelling  . Dristan Rash     REVIEW OF SYSTEMS (Negative unless checked)  Constitutional: [] Weight loss  [] Fever  [] Chills Cardiac: [] Chest pain   [x] Chest pressure   [] Palpitations   [] Shortness of breath when laying flat   [x] Shortness of breath at rest   [x] Shortness of breath with exertion. Vascular:  [] Pain in legs with walking   [] Pain in legs at rest   [] Pain in legs when laying flat   [] Claudication   [] Pain in feet when walking  [] Pain in feet at rest  [] Pain in feet when laying flat   [] History of DVT   [] Phlebitis   [x] Swelling in legs   [] Varicose veins   [] Non-healing ulcers Pulmonary:   [] Uses home oxygen   [] Productive cough   [] Hemoptysis   [] Wheeze  [] COPD   [] Asthma Neurologic:  [] Dizziness  [] Blackouts   [] Seizures   [] History of stroke   [] History of TIA  [] Aphasia   [] Temporary blindness   [] Dysphagia   [] Weakness or numbness in arms   [] Weakness or numbness in legs Musculoskeletal:  [] Arthritis   [] Joint swelling   [] Joint pain   [] Low back pain Hematologic:  [] Easy bruising  [] Easy bleeding   [] Hypercoagulable state    [] Anemic  [] Hepatitis Gastrointestinal:  [] Blood in stool   [] Vomiting blood  []   Gastroesophageal reflux/heartburn   [] Difficulty swallowing. Genitourinary:  [x] Chronic kidney disease   [] Difficult urination  [] Frequent urination  [] Burning with urination   [] Blood in urine Skin:  [x] Rashes   [] Ulcers   [] Wounds Psychological:  [] History of anxiety   []  History of major depression.  Physical Examination  Filed Vitals:   01/03/15 1700 01/03/15 1745 01/03/15 1800 01/03/15 1830  BP: 176/70  197/73 181/70  Pulse: 74 71 73 64  Temp:  98 F (36.7 C)    TempSrc:  Oral    Resp:  16 19 16   Height:      Weight:  78 kg (171 lb 15.3 oz)    SpO2: 96% 98% 97% 98%   Body mass index is 28.62 kg/(m^2). Gen:  WD/WN, NAD Head: [place]/AT, No temporalis wasting. Prominent temp pulse not noted. Ear/Nose/Throat: Hearing grossly intact, nares w/o erythema or drainage, oropharynx w/o Erythema/Exudate Eyes: PERRLA, EOMI.  Neck: Supple, no nuchal rigidity.  No bruit or JVD.  Pulmonary:  Good air movement, clear to auscultation bilaterally.  Cardiac: RRR, normal S1, S2, no Murmurs, rubs or gallops. Vascular: There are skin changes on the chest wall. The skin appears somewhat thicker. Ultrasound examinations demonstrates the jugular vein on the right is widely patent. Vessel Right Left  Radial Palpable Palpable  Ulnar Palpable Palpable  Brachial Palpable Palpable  Carotid Palpable, without bruit Palpable, without bruit   Gastrointestinal: soft, non-tender/non-distended. No guarding/reflex. No masses, surgical incisions, or scars. Musculoskeletal: M/S 5/5 throughout.  Extremities without ischemic changes.  No deformity or atrophy. 2+ edema. Neurologic: CN 2-12 intact. Pain and light touch intact in extremities.  Symmetrical.  Speech is slow. Motor exam as listed above. Psychiatric: Judgment poor Mood & affect appropriate for pt's clinical situation. Dermatologic: rashes noted on the chest wall or ulcers noted.   No cellulitis or open wounds. Lymph : No Cervical, Axillary, or Inguinal lymphadenopathy.   CBC Lab Results  Component Value Date   WBC 8.2 01/03/2015   HGB 8.6* 01/03/2015   HCT 26.4* 01/03/2015   MCV 87.9 01/03/2015   PLT 183 01/03/2015    BMET    Component Value Date/Time   NA 140 01/03/2015 0538   NA 142 09/17/2013 1022   K 4.4 01/03/2015 0538   K 4.4 09/17/2013 1022   CL 108 01/03/2015 0538   CL 114* 09/17/2013 1022   CO2 22 01/03/2015 0538   CO2 19* 09/17/2013 1022   GLUCOSE 143* 01/03/2015 0538   GLUCOSE 131* 09/17/2013 1022   BUN 75* 01/03/2015 0538   BUN 23* 09/17/2013 1022   CREATININE 8.20* 01/03/2015 0538   CREATININE 2.09* 09/17/2013 1022   CREATININE 1.77* 10/19/2010 1655   CALCIUM 7.6* 01/03/2015 0538   CALCIUM 9.4 09/17/2013 1022   GFRNONAA 6* 01/03/2015 0538   GFRNONAA 30* 09/17/2013 1022   GFRNONAA 25* 02/27/2011 1048   GFRAA 6* 01/03/2015 0538   GFRAA 35* 09/17/2013 1022   GFRAA 31* 02/27/2011 1048   Estimated Creatinine Clearance: 7.3 mL/min (by C-G formula based on Cr of 8.2).  COAG Lab Results  Component Value Date   INR 1.0 09/12/2011    Radiology Dg Chest 2 View  01/02/2015  CLINICAL DATA:  Shortness of breath x 2-3 weeks EXAM: CHEST  2 VIEW COMPARISON:  09/17/2013 FINDINGS: Small bilateral pleural effusions, left greater than right. Pulmonary vascular congestion without frank interstitial edema. Mild patchy bilateral lower lobe opacities, likely atelectasis. No pneumothorax. Cardiomegaly. IMPRESSION: Small bilateral pleural effusions, left greater than right. Mild  patchy bilateral lower lobe opacities, likely atelectasis. Electronically Signed   By: Julian Hy M.D.   On: 01/02/2015 20:50    Assessment/Plan  1. Acute on chronic renal insufficiency: Patient has been evaluated by nephrology and  dialysis will be initiated on an urgent basis. Based on my exam permacatheter is appropriate temp catheter not indicated and therefore  arrangements will be made to place a permacath later today patient and family concur with this plan.  2.Hypertension with congestive heart failure symptoms: Continue home medications for blood pressure and IV hydralazine, continue with diuresis will increase IV diuretic follow daily weights and ins and outs supplemental oxygen, breathing treatments, echocardiogram, consult nephrology for non-urgent dialysis  3. Type 2 diabetes insulin requiring: Continue basal insulin at insulin sliding scale  4. Hyperlipidemia unspecified statin therapy once identified statin will be continued  Jason Aguilar, Dolores Lory, MD  01/03/2015 6:56 PM

## 2015-01-03 NOTE — Progress Notes (Signed)
To cardiovascular testing via bed 

## 2015-01-04 ENCOUNTER — Encounter: Payer: Self-pay | Admitting: Vascular Surgery

## 2015-01-04 LAB — BASIC METABOLIC PANEL
Anion gap: 7 (ref 5–15)
BUN: 53 mg/dL — AB (ref 6–20)
CALCIUM: 7.8 mg/dL — AB (ref 8.9–10.3)
CO2: 27 mmol/L (ref 22–32)
CREATININE: 6.43 mg/dL — AB (ref 0.61–1.24)
Chloride: 108 mmol/L (ref 101–111)
GFR calc Af Amer: 9 mL/min — ABNORMAL LOW (ref >60)
GFR, EST NON AFRICAN AMERICAN: 7 mL/min — AB (ref >60)
Glucose, Bld: 97 mg/dL (ref 65–99)
Potassium: 4 mmol/L (ref 3.5–5.1)
SODIUM: 142 mmol/L (ref 135–145)

## 2015-01-04 LAB — GLUCOSE, CAPILLARY
GLUCOSE-CAPILLARY: 103 mg/dL — AB (ref 65–99)
GLUCOSE-CAPILLARY: 29 mg/dL — AB (ref 65–99)
GLUCOSE-CAPILLARY: 68 mg/dL (ref 65–99)
GLUCOSE-CAPILLARY: 68 mg/dL (ref 65–99)
GLUCOSE-CAPILLARY: 96 mg/dL (ref 65–99)
GLUCOSE-CAPILLARY: 211 mg/dL — AB (ref 65–99)
GLUCOSE-CAPILLARY: 166 mg/dL — AB (ref 65–99)
Glucose-Capillary: 82 mg/dL (ref 65–99)

## 2015-01-04 LAB — CBC
HCT: 23.9 % — ABNORMAL LOW (ref 40.0–52.0)
Hemoglobin: 7.8 g/dL — ABNORMAL LOW (ref 13.0–18.0)
MCH: 28.7 pg (ref 26.0–34.0)
MCHC: 32.8 g/dL (ref 32.0–36.0)
MCV: 87.4 fL (ref 80.0–100.0)
PLATELETS: 164 10*3/uL (ref 150–440)
RBC: 2.73 MIL/uL — AB (ref 4.40–5.90)
RDW: 14.5 % (ref 11.5–14.5)
WBC: 7.6 10*3/uL (ref 3.8–10.6)

## 2015-01-04 LAB — IRON AND TIBC
IRON: 51 ug/dL (ref 45–182)
Saturation Ratios: 32 % (ref 17.9–39.5)
TIBC: 159 ug/dL — AB (ref 250–450)
UIBC: 108 ug/dL

## 2015-01-04 LAB — HEPATITIS B SURFACE ANTIGEN: HEP B S AG: NEGATIVE

## 2015-01-04 LAB — HEMOGLOBIN A1C: Hgb A1c MFr Bld: 8.1 % — ABNORMAL HIGH (ref 4.0–6.0)

## 2015-01-04 MED ORDER — CLONIDINE HCL 0.1 MG PO TABS
0.1000 mg | ORAL_TABLET | Freq: Two times a day (BID) | ORAL | Status: DC
  Administered 2015-01-04 – 2015-01-06 (×5): 0.1 mg via ORAL
  Filled 2015-01-04 (×5): qty 1

## 2015-01-04 MED ORDER — CALCIUM ACETATE (PHOS BINDER) 667 MG PO CAPS
1334.0000 mg | ORAL_CAPSULE | Freq: Three times a day (TID) | ORAL | Status: DC
  Administered 2015-01-04 – 2015-01-06 (×7): 1334 mg via ORAL
  Filled 2015-01-04 (×7): qty 2

## 2015-01-04 MED ORDER — DEXTROSE 50 % IV SOLN: 1.0000 | Freq: Once | INTRAVENOUS | Status: DC

## 2015-01-04 MED ORDER — DEXTROSE 50 % IV SOLN
INTRAVENOUS | Status: AC
  Administered 2015-01-04: 50 mL
  Filled 2015-01-04: qty 50

## 2015-01-04 MED ORDER — ATORVASTATIN CALCIUM 20 MG PO TABS
20.0000 mg | ORAL_TABLET | Freq: Every day | ORAL | Status: DC
  Administered 2015-01-04 – 2015-01-05 (×2): 20 mg via ORAL
  Filled 2015-01-04 (×2): qty 1

## 2015-01-04 MED ORDER — NEPRO/CARBSTEADY PO LIQD
237.0000 mL | Freq: Three times a day (TID) | ORAL | Status: DC
  Administered 2015-01-05 – 2015-01-06 (×4): 237 mL via ORAL

## 2015-01-04 MED ORDER — ALPRAZOLAM 0.25 MG PO TABS
0.2500 mg | ORAL_TABLET | Freq: Three times a day (TID) | ORAL | Status: DC | PRN
  Administered 2015-01-04: 0.25 mg via ORAL
  Filled 2015-01-04: qty 1

## 2015-01-04 NOTE — Care Management (Signed)
Patient does have primary care physician at [facility] in [place].  He does not recall the physician name other than it is hard to pronounce. He lives with his wife.  Has access to a walker.  Says he has been feeling very weak over the last few weeks but he thinks the dialysis is helping.  Patient had a perm cath placed and initial dialysis 12/6 and another today.Patient's current need for 02 is acute. Says he has not been up much since admitted.  Discussed that will need to assess his need for home 02 and functional status.  have notified dialysis coordinator of dialysis.  At present time, it has not been stated that patient has esrd.

## 2015-01-04 NOTE — Care Management Important Message (Signed)
Important Message  Patient Details  Name: CONLEY DEMAN MRN: [id number] Date of Birth: [birth year]   Medicare Important Message Given:  Yes    Juliann Pulse A Caprice Mccaffrey 01/04/2015, 1:24 PM

## 2015-01-04 NOTE — Progress Notes (Signed)
Simvastatin 40 mg was changed per protocol to atorvastatin 20 mg due to drug-drug interaction with amlodipine. Please call pharmacy if you have any questions about this change. Thank you.  Jeanette Moffatt A. [place], [place].D., BCPS Clinical Pharmacist 01/04/15 [phone]

## 2015-01-04 NOTE — Progress Notes (Signed)
Hypoglycemic Event  CBG: 68 @ 1327 Treatment: 2 cups of orange juice Symptoms: patient was asymptomatic   Follow up CBG: 96 @ 1401  Possible reasons for Event: low food intake  Comments: Will continue to monitor patient.

## 2015-01-04 NOTE — Progress Notes (Signed)
Nurse went to check on pt, nurse noticed that pt was bleeding from perm-cath site. Nurse held pressure onto site for 35 minutes with no change. MD notified. Dr. Lucky Cowboy ordered for pt to sit straight up, and to keep applying pressure. Pt sat up, and pressure was applied for 15 minutes. Bleeding slowed but did not completely stop. Administration Coordinator, Alecia applied pressure dsg. Pt cleaned up. Will continue to monitor.

## 2015-01-04 NOTE — Progress Notes (Signed)
Initial Nutrition Assessment   INTERVENTION:   Meals and Snacks: Cater to patient preferences. RD notes MD just changed diet order to Soft Diet with 1863mL fluid restriction. Pt may benefit from Renal/Carb Modified diet order as well.  Medical Food Supplement Therapy: will recommend on follow if intake poor Education: RD provided "Dietary Guidelines for Adults starting on Hemodialysis" by the [facility] to patient/family. Reviewed food groups and provided written recommended serving sizes specifically determined for patient's current nutritional status.  Explained why diet restrictions are needed and provided lists of foods to limit/avoid that are high potassium, sodium, and phosphorus. Provided specific recommendations on safer alternatives of these foods. Strongly encouraged compliance of this diet. Pt reports multiple times not using salt shaker PTA. Discussed importance of protein intake at each meal and snack. Provided examples of how to maximize protein intake throughout the day. Discussed need for fluid restriction with dialysis, importance of minimizing weight gain between HD treatments, and renal-friendly beverage options. Encouraged pt to discuss specific diet questions/concerns with RD at HD outpatient facility. Teach back method used. Left written materials for pt's wife and daughter to review as well, as pt reports they fix his meals on a regular basis. Expect good compliance.   NUTRITION DIAGNOSIS:   Food and nutrition related knowledge deficit related to chronic illness as evidenced by  (new to dialysis ).  GOAL:   Patient will meet greater than or equal to 90% of their needs  MONITOR:    (Energy Intake, Electrolyte and renal Profile, Glucose Profile, Anthropometrics, Digestive system)  REASON FOR ASSESSMENT:   Consult Assessment of nutrition requirement/status  ASSESSMENT:   Pt admitted with worsening SOB for the past 2 weeks secondary to CHF and CKD  stage V. Pt s/p permcath placement on 12/6 with first treatment of HD afterwards.  Past Medical History  Diagnosis Date  . Glaucoma     both eyes Drs. Arrie [place] and [place]  . Hematuria syndrome     s/p negative cystoscopy  . GERD (gastroesophageal reflux disease) 2003    EGD  . Allergy   . Hyperlipidemia   . Obstructive sleep apnea of adult     CPAP at 8 cm H20  . Noncompliance with medication regimen   . 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 by [facility]: low grade noninvasive  . Chronic kidney disease (CKD), stage III (moderate)     with proteinuria  . Glaucoma   . Hypertension   . Diabetes mellitus     Type 2  . Cataract due to secondary diabetes mellitus ([place])   . COPD (chronic obstructive pulmonary disease) ([place])     secondary to tobacco abuse  . History of tobacco abuse     60 pack year history; quit 1980    Diet Order:  DIET SOFT Room service appropriate?: Yes; Fluid consistency:: Thin; Fluid restriction:: 1800 mL Fluid   Current Nutrition: Pt had just been delivered meatloaf, green beans and mashed potatoes on visit. Pt reports having ate some of his breakfast before dialysis.  Food/Nutrition-Related History: Pt reports good appetite PTA. Pt reports his wife and daughter make his meals usually. Pt reports usually eating 3 meals per day and reports liking baked chicken and fish and softer meats. Pt reports sometimes eating a sausage biscuit in the am.    Scheduled Medications:  . amLODipine  10 mg Oral Daily  . antiseptic oral rinse  7 mL Mouth Rinse q12n4p  . atorvastatin  20  mg Oral q1800  . calcium acetate  1,334 mg Oral TID WC  .  ceFAZolin (ANCEF) IV  1 g Intravenous On Call  . chlorhexidine  15 mL Mouth Rinse BID  . cloNIDine  0.1 mg Oral BID  . dextrose  1 ampule Intravenous Once  . doxazosin  8 mg Oral Daily  . feeding supplement (NEPRO CARB STEADY)  237 mL Oral TID WC  . furosemide  40 mg Intravenous Daily  . heparin  5,000 Units  Subcutaneous Q12H  . hydrALAZINE  100 mg Oral TID  . insulin aspart  0-5 Units Subcutaneous QHS  . insulin aspart  0-9 Units Subcutaneous TID WC  . metoprolol  50 mg Oral BID  . sodium bicarbonate  650 mg Oral BID  . tuberculin  5 Units Intradermal Once    Electrolyte/Renal Profile and Glucose Profile:   Recent Labs Lab 01/03/15 0538 01/03/15 1150 01/03/15 1847 01/04/15 0458  NA 140  --  142 142  K 4.4  --  4.9 4.0  CL 108  --  108 108  CO2 22  --  21* 27  BUN 75*  --  76* 53*  CREATININE 8.20*  --  8.04* 6.43*  CALCIUM 7.6*  --  7.9* 7.8*  PHOS  --  7.7* 8.2*  --   GLUCOSE 143*  --  190* 97   Protein Profile:  Recent Labs Lab 01/02/15 1924 01/03/15 1847  ALBUMIN 3.0* 2.8*   Nutritional Anemia Profile:  CBC Latest Ref Rng 01/04/2015 01/03/2015 01/03/2015  WBC 3.8 - 10.6 K/uL 7.6 8.4 8.2  Hemoglobin 13.0 - 18.0 g/dL 7.8(L) 8.5(L) 8.6(L)  Hematocrit 40.0 - 52.0 % 23.9(L) 25.6(L) 26.4(L)  Platelets 150 - 440 K/uL 164 189 183   Lab Results  Component Value Date   HGBA1C 7.7* 01/02/2015    Gastrointestinal Profile: Last BM:  01/03/2015  Output: per MD note, 1.5L UF yesterday, 1L UF today; pt scheduled for HD again tomorrow.  Nutrition-Focused Physical Exam Findings:  Unable to complete Nutrition-Focused physical exam at this time.    Weight Change: Pt reports UBW of 160-165lbs, current weight 164lbs recorded.    Skin:  Reviewed, no issues  Last BM:  01/03/2015  Height:   Ht Readings from Last 1 Encounters:  01/02/15 5\' 5"  (1.651 m)    Weight:   Wt Readings from Last 1 Encounters:  01/04/15 158 lb 8.2 oz (71.9 kg)    Wt Readings from Last 10 Encounters:  01/04/15 158 lb 8.2 oz (71.9 kg)  09/02/11 169 lb 8 oz (76.885 kg)  08/23/11 164 lb (74.39 kg)  07/04/11 161 lb (73.029 kg)  04/05/11 159 lb (72.122 kg)  03/08/11 158 lb (71.668 kg)  10/19/10 161 lb 8 oz (73.256 kg)    BMI:  Body mass index is 26.38 kg/(m^2).  Estimated Nutritional Needs:    Kcal:  BEE: 1264kcals, TEE: (IF 1.1-1.3)(AF 1.2) 1670-1973kcals, using IBW of 61.8kg  Protein:  74-93g protein (1.2-1.5g/kg)  Fluid:  UOP+1L  EDUCATION NEEDS:   Education needs addressed   [place], RD, LDN Pager [phone]

## 2015-01-04 NOTE — Progress Notes (Signed)
Inpatient Diabetes Program Recommendations  AACE/ADA: New Consensus Statement on Inpatient Glycemic Control (2015)  Target Ranges:  Prepandial:   less than 140 mg/dL      Peak postprandial:   less than 180 mg/dL (1-2 hours)      Critically ill patients:  140 - 180 mg/dL   Review of Glycemic Control:  Results for ETAI, LISENBY (MRN [id number]) as of 01/04/2015 10:31  Ref. Range 01/03/2015 22:42 01/04/2015 03:51 01/04/2015 04:24 01/04/2015 07:40 01/04/2015 08:22  Glucose-Capillary Latest Ref Range: 65-99 mg/dL 131 (H) 29 (LL) 103 (H) 68 82   Diabetes history: Type 2 diabetes Outpatient Diabetes medications: Levemir 38 units daily Current orders for Inpatient glycemic control:  Novolog sensitive tid with meals and HS,  Inpatient Diabetes Program Recommendations:    Please reduce Levemir to 18 units q HS.    Thanks, Adah Perl, RN, BC-ADM Inpatient Diabetes Coordinator Pager [phone] (8a-5p)

## 2015-01-04 NOTE — Progress Notes (Signed)
[facility]  ROUNDING NOTE   Subjective:   Seen and examined on hemodialysis. Second treatment. Tolerating treatment well. UF of 1.5 litres  Initial hemodialysis yesterday, 12/6. Tolerated treatment well. UF of 1 litre. However some bleeding at catheter site post treatment.   Patient is breathing better  Objective:  Vital signs in last 24 hours:  Temp:  [97.7 F (36.5 C)-98.2 F (36.8 C)] 97.7 F (36.5 C) (12/07 0400) Pulse Rate:  [62-81] 67 (12/07 0927) Resp:  [15-30] 22 (12/07 0400) BP: (160-233)/(50-81) 177/54 mmHg (12/07 0927) SpO2:  [88 %-98 %] 94 % (12/07 0400) Weight:  [74.753 kg (164 lb 12.8 oz)-78 kg (171 lb 15.3 oz)] 74.753 kg (164 lb 12.8 oz) (12/07 0500)  Weight change: -3.7 kg (-8 lb 2.5 oz) Filed Weights   01/03/15 1745 01/03/15 2024 01/04/15 0500  Weight: 78 kg (171 lb 15.3 oz) 77 kg (169 lb 12.1 oz) 74.753 kg (164 lb 12.8 oz)    Intake/Output: I/O last 3 completed shifts: In: 240 [P.O.:240] Out: 2450 [Urine:1450; Other:1000]   Intake/Output this shift:  Total I/O In: 240 [P.O.:240] Out: 175 [Urine:175]  Physical Exam: General: NAD, elderly, frail  Head: Normocephalic, atraumatic. Moist oral mucosal membranes, hard of hearing  Eyes: Anicteric, PERRL  Neck: Supple, trachea midline  Lungs:  Bilateral diffuse crackles  Heart: Regular rate and rhythm  Abdomen:  Soft, nontender,   Extremities: trace peripheral edema.  Neurologic: Nonfocal, moving all four extremities  Skin: No lesions  Access: RIJ permcath - no active bleeding: Dr. Delana Meyer [id number]    Basic Metabolic Panel:  Recent Labs Lab 01/02/15 1924 01/03/15 0538 01/03/15 1150 01/03/15 1847 01/04/15 0458  NA 141 140  --  142 142  K 4.9 4.4  --  4.9 4.0  CL 108 108  --  108 108  CO2 23 22  --  21* 27  GLUCOSE 306* 143*  --  190* 97  BUN 75* 75*  --  76* 53*  CREATININE 7.94* 8.20*  --  8.04* 6.43*  CALCIUM 8.0* 7.6*  --  7.9* 7.8*  PHOS  --   --  7.7* 8.2*  --     Liver  Function Tests:  Recent Labs Lab 01/02/15 1924 01/03/15 1847  AST 10*  --   ALT 11*  --   ALKPHOS 57  --   BILITOT <0.1*  --   PROT 5.4*  --   ALBUMIN 3.0* 2.8*   No results for input(s): LIPASE, AMYLASE in the last 168 hours. No results for input(s): AMMONIA in the last 168 hours.  CBC:  Recent Labs Lab 01/02/15 1924 01/03/15 0538 01/03/15 1847 01/04/15 0458  WBC 9.7 8.2 8.4 7.6  NEUTROABS 7.7*  --   --   --   HGB 9.6* 8.6* 8.5* 7.8*  HCT 29.2* 26.4* 25.6* 23.9*  MCV 88.5 87.9 88.7 87.4  PLT 205 183 189 164    Cardiac Enzymes:  Recent Labs Lab 01/02/15 1924 01/02/15 2342 01/03/15 0538 01/03/15 1150  TROPONINI 0.04* 0.03 0.05* 0.03    BNP: Invalid input(s): POCBNP  CBG:  Recent Labs Lab 01/03/15 2242 01/04/15 0351 01/04/15 0424 01/04/15 0740 01/04/15 0822  GLUCAP 131* 29* 103* 74 82    Microbiology: Results for orders placed or performed in visit on 07/24/13  Culture, blood (single)     Status: None   Collection Time: 07/24/13 12:53 AM  Result Value Ref Range Status   Micro Text Report   Final  ORGANISM 1                COAGULASE NEGATIVE STAPHYLOCOCCUS   COMMENT                   IN AEROBIC BOTTLE ONLY   COMMENT                   POSSIBLE CONTAMINATION W/SKIN FLORA   GRAM STAIN                GRAM POSITIVE COCCI IN CLUSTERS   ANTIBIOTIC                                                      Culture, blood (single)     Status: None   Collection Time: 07/24/13 12:53 AM  Result Value Ref Range Status   Micro Text Report   Final       COMMENT                   NO GROWTH AEROBICALLY/ANAEROBICALLY IN 5 DAYS   ANTIBIOTIC                                                        Coagulation Studies: No results for input(s): LABPROT, INR in the last 72 hours.  Urinalysis: No results for input(s): COLORURINE, LABSPEC, PHURINE, GLUCOSEU, HGBUR, BILIRUBINUR, KETONESUR, PROTEINUR, UROBILINOGEN, NITRITE, LEUKOCYTESUR in the last 72  hours.  Invalid input(s): APPERANCEUR    Imaging: Dg Chest 2 View  01/02/2015  CLINICAL DATA:  Shortness of breath x 2-3 weeks EXAM: CHEST  2 VIEW COMPARISON:  09/17/2013 FINDINGS: Small bilateral pleural effusions, left greater than right. Pulmonary vascular congestion without frank interstitial edema. Mild patchy bilateral lower lobe opacities, likely atelectasis. No pneumothorax. Cardiomegaly. IMPRESSION: Small bilateral pleural effusions, left greater than right. Mild patchy bilateral lower lobe opacities, likely atelectasis. Electronically Signed   By: Julian Hy M.D.   On: 01/02/2015 20:50     Medications:     . amLODipine  10 mg Oral Daily  . antiseptic oral rinse  7 mL Mouth Rinse q12n4p  . atorvastatin  20 mg Oral q1800  .  ceFAZolin (ANCEF) IV  1 g Intravenous On Call  . chlorhexidine  15 mL Mouth Rinse BID  . dextrose  1 ampule Intravenous Once  . doxazosin  8 mg Oral Daily  . furosemide  40 mg Intravenous Daily  . heparin  5,000 Units Subcutaneous Q12H  . hydrALAZINE  100 mg Oral TID  . insulin aspart  0-5 Units Subcutaneous QHS  . insulin aspart  0-9 Units Subcutaneous TID WC  . insulin detemir  38 Units Subcutaneous QHS  . metoprolol  50 mg Oral BID  . sodium bicarbonate  650 mg Oral BID  . tuberculin  5 Units Intradermal Once   acetaminophen **OR** acetaminophen, hydrALAZINE, ipratropium-albuterol, morphine injection, ondansetron **OR** ondansetron (ZOFRAN) IV, oxyCODONE  Assessment/ Plan:  Jason Aguilar is a 79 y.o. white male with hypertension, hyperlipidemia, diabetes mellitus type II Insulin Dependent, diabetic retinopathy, BPH, obstructive sleep apnea who presents Hyperglycemia [R73.9] Elevated troponin [R79.89] Secondary hypertension, unspecified [I15.9] Acute  renal failure, unspecified acute renal failure type ([place]) [N17.9] Acute congestive heart failure, unspecified congestive heart failure type ([place]) [I50.9]  1. End stage renal disease: first  dialysis yesterday.  - Check hepatitis panel negative. PPD be to read tomorrow Will plan on [address]. [facility].   2. Hypertension: not well controlled. Volume driven.  - doxazosin, furosemide, hydralazine, and metoprolol - not on an ACE-I/ARB due to advanced renal disease. Will need to restart as an outpatient.   3. Diabetes Mellitus type II Insulin Dependent with chronic kidney disease: continue glucose control. Hemoglobin A1c of 7.7%.   4. Anemia of chronic kidney disease:  Normocytic with hemoglobin of 7.8 - epo with dialysis treatment.  - iron studies consistent with iron deficiency.   5. Secondary Hyperparathyroidism: Corrected calcium at goal. Not currently on binders or activated vitamin D - Phos elevated. PTH 1300 - Start calcium acetate with meals.     LOS: [place], Javin Nong 12/7/201611:01 AM

## 2015-01-04 NOTE — Progress Notes (Signed)
Hypoglycemic Event  CBG: 29  Treatment: D50 ampule  Symptoms: Sweating  Follow-up CBG: Time:0425 CBG Result:103  Possible Reasons for Event: Inadequate PO intake  Comments/MD notified: Dr. Juventino Slovak, Lysle Rubens

## 2015-01-04 NOTE — Progress Notes (Signed)
Patient's family member stated patient stated he does not feel right. Patient stated, "I don't feel right, feels like my heart is pumping too hard. Patient stated it could be my nerves because I am worrying my my diagnosis of CHF." Assessed patients vitals BP 154/50 HR 74, O2 sats were 93% on 3L via nasal cannula. CBG was 211 per glucometer. Notified Dr. Darden Dates who ordered 0.25 mg of Xanax TID PRN. Will continue to monitor patient. Horton Finer

## 2015-01-04 NOTE — Progress Notes (Signed)
[facility] at [place] NAME: Jason Aguilar    MR#:  [id number]  DATE OF BIRTH:  05-24-1935  SUBJECTIVE:  CHIEF COMPLAINT:   Chief Complaint  Patient presents with  . Shortness of Breath   -feels much better, had permacath placed and dialysis done yesterday - Dialysis again today - breathing improved. BP elevated  REVIEW OF SYSTEMS:  Review of Systems  Constitutional: Negative for fever and chills.  Respiratory: Positive for shortness of breath. Negative for cough and wheezing.   Cardiovascular: Positive for leg swelling. Negative for chest pain and palpitations.  Gastrointestinal: Negative for nausea, vomiting, abdominal pain, diarrhea and constipation.  Genitourinary: Negative for dysuria.  Musculoskeletal: Negative for myalgias and neck pain.  Neurological: Positive for weakness. Negative for dizziness, speech change, focal weakness, seizures and headaches.  Psychiatric/Behavioral: Negative for depression.    DRUG ALLERGIES:   Allergies  Allergen Reactions  . Losartan Swelling  . Dristan Rash    VITALS:  Blood pressure 201/67, pulse 72, temperature 98.5 F (36.9 C), temperature source Axillary, resp. rate 18, height 5\' 5"  (1.651 m), weight 71.9 kg (158 lb 8.2 oz), SpO2 98 %.  PHYSICAL EXAMINATION:  Physical Exam  GENERAL:  79 y.o.-year-old obese patient lying in the bed, appears short of breath.  EYES: Pupils equal, round, reactive to light and accommodation. No scleral icterus. Extraocular muscles intact.  HEENT: Head atraumatic, normocephalic. Oropharynx and nasopharynx clear.  NECK:  Supple, no jugular venous distention. No thyroid enlargement, no tenderness.  LUNGS: Decreased bibasilar breath sounds. Fine bibasilar crackles heard. No wheezes or rhonchi heard. Use of accessory muscles noted with minimal exertion. CARDIOVASCULAR: S1, S2 normal. No murmurs, rubs, or gallops.  ABDOMEN: Soft, nontender, nondistended.  Bowel sounds present. No organomegaly or mass. Using abdominal muscles for breathing EXTREMITIES: No pedal edema, cyanosis, or clubbing.  NEUROLOGIC: Cranial nerves II through XII are intact. Muscle strength 5/5 in all extremities. Sensation intact. Gait not checked.  PSYCHIATRIC: The patient is alert and oriented x 3. Impaired Cognitive  ability SKIN: No obvious rash, lesion, or ulcer.    LABORATORY PANEL:   CBC  Recent Labs Lab 01/04/15 0458  WBC 7.6  HGB 7.8*  HCT 23.9*  PLT 164   ------------------------------------------------------------------------------------------------------------------  Chemistries   Recent Labs Lab 01/02/15 1924  01/04/15 0458  NA 141  < > 142  K 4.9  < > 4.0  CL 108  < > 108  CO2 23  < > 27  GLUCOSE 306*  < > 97  BUN 75*  < > 53*  CREATININE 7.94*  < > 6.43*  CALCIUM 8.0*  < > 7.8*  AST 10*  --   --   ALT 11*  --   --   ALKPHOS 57  --   --   BILITOT <0.1*  --   --   < > = values in this interval not displayed. ------------------------------------------------------------------------------------------------------------------  Cardiac Enzymes  Recent Labs Lab 01/03/15 1150  TROPONINI 0.03   ------------------------------------------------------------------------------------------------------------------  RADIOLOGY:  Dg Chest 2 View  01/02/2015  CLINICAL DATA:  Shortness of breath x 2-3 weeks EXAM: CHEST  2 VIEW COMPARISON:  09/17/2013 FINDINGS: Small bilateral pleural effusions, left greater than right. Pulmonary vascular congestion without frank interstitial edema. Mild patchy bilateral lower lobe opacities, likely atelectasis. No pneumothorax. Cardiomegaly. IMPRESSION: Small bilateral pleural effusions, left greater than right. Mild patchy bilateral lower lobe opacities, likely atelectasis. Electronically Signed   By: Julian Hy  M.D.   On: 01/02/2015 20:50    EKG:   Orders placed or performed during the hospital encounter  of 01/02/15  . ED EKG  . ED EKG    ASSESSMENT AND PLAN:   79 year old male with past medical history significant for CK D stage V following with [facility] nephrology, diabetes mellitus, sleep apnea, history of bladder cancer, COPD admitted for shortness of breath and noted to be in acute renal failure and also diastolic CHF exacerbation.   #1 acute pulmonary edema-secondary to acute diastolic CHF exacerbation and also poor renal function. -Admitted to telemetry, decreased urine output. -started on dialysis. Echocardiogram done. Continue oxygen support for now. - slowly improving  #2 CKD stage V progressing to end-stage renal disease-follows with [facility] nephrology as outpatient. -Nephrology and vascular consulted.  -permacath placed and dialysis started 01/03/15- second session today - will need to get outpatient dialysis set up prior to discharge - cause for ESRD- HTN and DM  #3 hypertension-poorly controlled hypertension as outpatient. Continue his home medications for now. Patient is on  metoprolol, hydralazine, Lasix, doxazosin and norvasc. - Also recommended low-sodium diet. - added clonidine today - IV hydralazine prn  #4 diabetes mellitus-insulin-dependent. Hypoglycemic last evening and this am - discontinue Lantus, continue SSI - change diet to soft diet as patient has teeth issues and nepro supplements  #5 history of bladder cell cancer-status post TURBT in 2013, no obstructive symptoms at this time. Follow up with urology as outpatient.  #6 anemia of chronic kidney disease-monitor for now. Likely epogen with dialysis. Check iron levels  #7 DVT Prophylaxis- Heparin SQ   All the records are reviewed and case discussed with Care Management/Social Workerr. Management plans discussed with the patient, family and they are in agreement.  CODE STATUS: Full code  TOTAL TIME TAKING CARE OF THIS PATIENT: 37 minutes.   POSSIBLE D/C IN 2-3 DAYS, DEPENDING ON CLINICAL  CONDITION.   Gladstone Lighter M.D on 01/04/2015 at 2:09 PM  Between 7am to 6pm - Pager - [phone]  After 6pm go to [email or web address] - password [email or web address] [place] [facility]  Office  [phone]  CC: Primary care physician; Pcp Not In System

## 2015-01-04 NOTE — Progress Notes (Signed)
Pt doesn't want to wear CPAP. Pt states that he doesn't wear one at home and only uses oxygen.

## 2015-01-04 NOTE — Progress Notes (Signed)
Hypoglycemic Even   CBG: 68  Treatment: 2 cups of orange juice 0800  Symptoms: patient had no symptoms.   Follow up CBG: 82 @ 0824  Possible reasons for Event:   Comments: Will continue to monitor patient

## 2015-01-05 ENCOUNTER — Encounter: Admission: RE | Payer: Self-pay | Source: Ambulatory Visit

## 2015-01-05 ENCOUNTER — Ambulatory Visit: Admission: RE | Admit: 2015-01-05 | Payer: Medicare Other | Source: Ambulatory Visit | Admitting: Vascular Surgery

## 2015-01-05 LAB — RENAL FUNCTION PANEL
ALBUMIN: 2.3 g/dL — AB (ref 3.5–5.0)
Anion gap: 6 (ref 5–15)
BUN: 32 mg/dL — AB (ref 6–20)
CALCIUM: 7.7 mg/dL — AB (ref 8.9–10.3)
CO2: 30 mmol/L (ref 22–32)
Chloride: 104 mmol/L (ref 101–111)
Creatinine, Ser: 4.68 mg/dL — ABNORMAL HIGH (ref 0.61–1.24)
GFR calc Af Amer: 13 mL/min — ABNORMAL LOW (ref >60)
GFR calc non Af Amer: 11 mL/min — ABNORMAL LOW (ref >60)
GLUCOSE: 192 mg/dL — AB (ref 65–99)
PHOSPHORUS: 4.2 mg/dL (ref 2.5–4.6)
Potassium: 4.5 mmol/L (ref 3.5–5.1)
SODIUM: 140 mmol/L (ref 135–145)

## 2015-01-05 LAB — CBC
HEMATOCRIT: 20.5 % — AB (ref 40.0–52.0)
Hemoglobin: 6.8 g/dL — ABNORMAL LOW (ref 13.0–18.0)
MCH: 29 pg (ref 26.0–34.0)
MCHC: 33.2 g/dL (ref 32.0–36.0)
MCV: 87.4 fL (ref 80.0–100.0)
Platelets: 144 10*3/uL — ABNORMAL LOW (ref 150–440)
RBC: 2.35 MIL/uL — ABNORMAL LOW (ref 4.40–5.90)
RDW: 15 % — AB (ref 11.5–14.5)
WBC: 6.6 10*3/uL (ref 3.8–10.6)

## 2015-01-05 LAB — BASIC METABOLIC PANEL
Anion gap: 5 (ref 5–15)
BUN: 34 mg/dL — AB (ref 6–20)
CALCIUM: 7.5 mg/dL — AB (ref 8.9–10.3)
CHLORIDE: 105 mmol/L (ref 101–111)
CO2: 30 mmol/L (ref 22–32)
CREATININE: 4.83 mg/dL — AB (ref 0.61–1.24)
GFR calc non Af Amer: 10 mL/min — ABNORMAL LOW (ref >60)
GFR, EST AFRICAN AMERICAN: 12 mL/min — AB (ref >60)
Glucose, Bld: 190 mg/dL — ABNORMAL HIGH (ref 65–99)
Potassium: 4.5 mmol/L (ref 3.5–5.1)
SODIUM: 140 mmol/L (ref 135–145)

## 2015-01-05 LAB — ABO/RH: ABO/RH(D): A NEG

## 2015-01-05 LAB — PREPARE RBC (CROSSMATCH)

## 2015-01-05 LAB — GLUCOSE, CAPILLARY
GLUCOSE-CAPILLARY: 195 mg/dL — AB (ref 65–99)
GLUCOSE-CAPILLARY: 122 mg/dL — AB (ref 65–99)
GLUCOSE-CAPILLARY: 277 mg/dL — AB (ref 65–99)
Glucose-Capillary: 176 mg/dL — ABNORMAL HIGH (ref 65–99)
Glucose-Capillary: 283 mg/dL — ABNORMAL HIGH (ref 65–99)

## 2015-01-05 SURGERY — DIALYSIS/PERMA CATHETER INSERTION
Anesthesia: Moderate Sedation

## 2015-01-05 MED ORDER — SODIUM CHLORIDE 0.9 % IV SOLN
Freq: Once | INTRAVENOUS | Status: AC
  Administered 2015-01-05: 15:00:00 via INTRAVENOUS

## 2015-01-05 MED ORDER — EPOETIN ALFA 10000 UNIT/ML IJ SOLN
10000.0000 [IU] | Freq: Once | INTRAMUSCULAR | Status: AC
  Administered 2015-01-05: 10000 [IU] via INTRAVENOUS

## 2015-01-05 NOTE — Progress Notes (Signed)
This note also relates to the following rows which could not be included: BP - Cannot attach notes to unvalidated device data   Post hd tx

## 2015-01-05 NOTE — Progress Notes (Signed)
PT Attempt Note  Patient Details Name: JOHNMATTHEW PRESTO MRN: [id number] DOB: 12/08/1935   Cancelled Treatment:    Reason Eval/Treat Not Completed: Patient at procedure or test/unavailable. Pt out of room for HD. Will attempt evaluation on later date/time as patient is available.  Lyndel Safe Ammy Lienhard PT, DPT   Dany Walther 01/05/2015, 11:08 AM

## 2015-01-05 NOTE — Care Management (Signed)
Spoke with nephrology and attending.  Anticipate discharge if all remains stable within next 24 hours.  Dr Juleen China relays that patient has a chair at [facility] on [address]  Spoke with Larene Beach at clinic and do not have a chair time yet.  Reqested that this be called to CM so prior to discharge so can make sure patient receives this information prior to discharge.  Patient being dialyzed today in a chair.  He will be assessed for need of home 02 and have PT consult.  Dr Juleen China says that next treatment will be Monday  12/12

## 2015-01-05 NOTE — Progress Notes (Signed)
[facility]  ROUNDING NOTE   Subjective:   Seen and examined on hemodialysis. Third treatment. Tolerating treatment well. UF of 2 litres. Sitting in chair  Initial hemodialysis, 12/6.    Objective:  Vital signs in last 24 hours:  Temp:  [97.3 F (36.3 C)-98.5 F (36.9 C)] 98.2 F (36.8 C) (12/08 0930) Pulse Rate:  [58-75] 59 (12/08 1100) Resp:  [15-22] 15 (12/08 1100) BP: (143-201)/(46-67) 170/60 mmHg (12/08 1100) SpO2:  [93 %-99 %] 99 % (12/08 1100) Weight:  [71.9 kg (158 lb 8.2 oz)-74.118 kg (163 lb 6.4 oz)] 74.118 kg (163 lb 6.4 oz) (12/08 0930)  Weight change: -6.1 kg (-13 lb 7.2 oz) Filed Weights   01/04/15 1305 01/05/15 0530 01/05/15 0930  Weight: 71.9 kg (158 lb 8.2 oz) 74.118 kg (163 lb 6.4 oz) 74.118 kg (163 lb 6.4 oz)    Intake/Output: I/O last 3 completed shifts: In: 240 [P.O.:240] Out: 525 [Urine:1025]   Intake/Output this shift:  Total I/O In: -  Out: 100 [Urine:100]  Physical Exam: General: NAD, elderly, frail  Head: Normocephalic, atraumatic. Moist oral mucosal membranes, hard of hearing  Eyes: Anicteric, PERRL  Neck: Supple, trachea midline  Lungs:  Bilateral diffuse crackles  Heart: Regular rate and rhythm  Abdomen:  Soft, nontender,   Extremities: trace peripheral edema.  Neurologic: Nonfocal, moving all four extremities  Skin: No lesions  Access: RIJ permcath - no active bleeding: Dr. Delana Meyer [id number]    Basic Metabolic Panel:  Recent Labs Lab 01/02/15 1924 01/03/15 0538 01/03/15 1150 01/03/15 1847 01/04/15 0458 01/05/15 0631  NA 141 140  --  142 142 140  140  K 4.9 4.4  --  4.9 4.0 4.5  4.5  CL 108 108  --  108 108 104  105  CO2 23 22  --  21* 27 30  30   GLUCOSE 306* 143*  --  190* 97 192*  190*  BUN 75* 75*  --  76* 53* 32*  34*  CREATININE 7.94* 8.20*  --  8.04* 6.43* 4.68*  4.83*  CALCIUM 8.0* 7.6*  --  7.9* 7.8* 7.7*  7.5*  PHOS  --   --  7.7* 8.2*  --  4.2    Liver Function Tests:  Recent Labs Lab  01/02/15 1924 01/03/15 1847 01/05/15 0631  AST 10*  --   --   ALT 11*  --   --   ALKPHOS 57  --   --   BILITOT <0.1*  --   --   PROT 5.4*  --   --   ALBUMIN 3.0* 2.8* 2.3*   No results for input(s): LIPASE, AMYLASE in the last 168 hours. No results for input(s): AMMONIA in the last 168 hours.  CBC:  Recent Labs Lab 01/02/15 1924 01/03/15 0538 01/03/15 1847 01/04/15 0458 01/05/15 0631  WBC 9.7 8.2 8.4 7.6 6.6  NEUTROABS 7.7*  --   --   --   --   HGB 9.6* 8.6* 8.5* 7.8* 6.8*  HCT 29.2* 26.4* 25.6* 23.9* 20.5*  MCV 88.5 87.9 88.7 87.4 87.4  PLT 205 183 189 164 144*    Cardiac Enzymes:  Recent Labs Lab 01/02/15 1924 01/02/15 2342 01/03/15 0538 01/03/15 1150  TROPONINI 0.04* 0.03 0.05* 0.03    BNP: Invalid input(s): POCBNP  CBG:  Recent Labs Lab 01/04/15 1401 01/04/15 1622 01/04/15 2057 01/05/15 0128 01/05/15 0725  GLUCAP 96 211* 166* 195* 176*    Microbiology: Results for orders placed or performed in  visit on 07/24/13  Culture, blood (single)     Status: None   Collection Time: 07/24/13 12:53 AM  Result Value Ref Range Status   Micro Text Report   Final       ORGANISM 1                COAGULASE NEGATIVE STAPHYLOCOCCUS   COMMENT                   IN AEROBIC BOTTLE ONLY   COMMENT                   POSSIBLE CONTAMINATION W/SKIN FLORA   GRAM STAIN                GRAM POSITIVE COCCI IN CLUSTERS   ANTIBIOTIC                                                      Culture, blood (single)     Status: None   Collection Time: 07/24/13 12:53 AM  Result Value Ref Range Status   Micro Text Report   Final       COMMENT                   NO GROWTH AEROBICALLY/ANAEROBICALLY IN 5 DAYS   ANTIBIOTIC                                                        Coagulation Studies: No results for input(s): LABPROT, INR in the last 72 hours.  Urinalysis: No results for input(s): COLORURINE, LABSPEC, PHURINE, GLUCOSEU, HGBUR, BILIRUBINUR, KETONESUR, PROTEINUR,  UROBILINOGEN, NITRITE, LEUKOCYTESUR in the last 72 hours.  Invalid input(s): APPERANCEUR    Imaging: No results found.   Medications:     . sodium chloride   Intravenous Once  . amLODipine  10 mg Oral Daily  . atorvastatin  20 mg Oral q1800  . calcium acetate  1,334 mg Oral TID WC  . cloNIDine  0.1 mg Oral BID  . doxazosin  8 mg Oral Daily  . epoetin (EPOGEN/PROCRIT) injection  10,000 Units Intravenous Once  . feeding supplement (NEPRO CARB STEADY)  237 mL Oral TID WC  . furosemide  40 mg Intravenous Daily  . heparin  5,000 Units Subcutaneous Q12H  . hydrALAZINE  100 mg Oral TID  . insulin aspart  0-5 Units Subcutaneous QHS  . insulin aspart  0-9 Units Subcutaneous TID WC  . metoprolol  50 mg Oral BID  . sodium bicarbonate  650 mg Oral BID   acetaminophen **OR** acetaminophen, ALPRAZolam, hydrALAZINE, ipratropium-albuterol, morphine injection, ondansetron **OR** ondansetron (ZOFRAN) IV, oxyCODONE  Assessment/ Plan:  Mr. BENJIMAN HODGEMAN is a 79 y.o. white male with hypertension, hyperlipidemia, diabetes mellitus type II Insulin Dependent, diabetic retinopathy, BPH, obstructive sleep apnea who presents Hyperglycemia [R73.9] Elevated troponin [R79.89] Secondary hypertension, unspecified [I15.9] Acute renal failure, unspecified acute renal failure type ([place]) [N17.9] Acute congestive heart failure, unspecified congestive heart failure type ([place]) [I50.9]  1. End stage renal disease: first dialysis yesterday.  - Check hepatitis panel negative.  Will plan on [address] [facility].   2.  Hypertension: not well controlled. Volume driven.  - doxazosin, furosemide, hydralazine, and metoprolol - not on an ACE-I/ARB due to advanced renal disease. Will need to restart as an outpatient.   3. Diabetes Mellitus type II Insulin Dependent with chronic kidney disease: continue glucose control. Hemoglobin A1c of 7.7%.   4. Anemia of chronic kidney disease:  Normocytic with  hemoglobin of 6.8 - epo with dialysis treatment.  - iron studies consistent with iron deficiency.  - transfusion ordered  5. Secondary Hyperparathyroidism: Corrected calcium at goal. Not currently on binders or activated vitamin D - Phos elevated. PTH 1300 - Started calcium acetate with meals.     LOS: [place], [place] 12/8/201611:31 AM

## 2015-01-05 NOTE — Progress Notes (Signed)
HD tx start 

## 2015-01-05 NOTE — Progress Notes (Signed)
[place] at [place] NAME: Jason Aguilar    MR#:  [id number]  DATE OF BIRTH:  1935/03/07  SUBJECTIVE:  CHIEF COMPLAINT:   Chief Complaint  Patient presents with  . Shortness of Breath   - Appears short of breath again today. Going for dialysis today-third session today. -Remains on 2 L oxygen.  REVIEW OF SYSTEMS:  Review of Systems  Constitutional: Negative for fever and chills.  Respiratory: Positive for shortness of breath. Negative for cough and wheezing.   Cardiovascular: Positive for leg swelling. Negative for chest pain and palpitations.  Gastrointestinal: Negative for nausea, vomiting, abdominal pain, diarrhea and constipation.  Genitourinary: Negative for dysuria.  Musculoskeletal: Negative for myalgias and neck pain.  Neurological: Positive for weakness. Negative for dizziness, speech change, focal weakness, seizures and headaches.  Psychiatric/Behavioral: Negative for depression.    DRUG ALLERGIES:   Allergies  Allergen Reactions  . Losartan Swelling  . Dristan Rash    VITALS:  Blood pressure 172/58, pulse 60, temperature 98.2 F (36.8 C), temperature source Oral, resp. rate 17, height 5\' 5"  (1.651 m), weight 74.118 kg (163 lb 6.4 oz), SpO2 99 %.  PHYSICAL EXAMINATION:  Physical Exam  GENERAL:  79 y.o.-year-old obese patient lying in the bed, appears short of breath. Using abdominal muscles to breathe EYES: Pupils equal, round, reactive to light and accommodation. No scleral icterus. Extraocular muscles intact.  HEENT: Head atraumatic, normocephalic. Oropharynx and nasopharynx clear.  NECK:  Supple, no jugular venous distention. No thyroid enlargement, no tenderness.  LUNGS: Decreased bibasilar breath sounds. Fine bibasilar crackles heard-worse today than yesterday. No wheezes or rhonchi heard. Use of accessory muscles noted with minimal exertion. CARDIOVASCULAR: S1, S2 normal. No murmurs, rubs, or gallops.   ABDOMEN: Soft, nontender, nondistended. Bowel sounds present. No organomegaly or mass. Using abdominal muscles for breathing EXTREMITIES: No pedal edema, cyanosis, or clubbing.  NEUROLOGIC: Cranial nerves II through XII are intact. Muscle strength 5/5 in all extremities. Sensation intact. Gait not checked.  PSYCHIATRIC: The patient is alert and oriented x 3. Impaired Cognitive  ability SKIN: No obvious rash, lesion, or ulcer.    LABORATORY PANEL:   CBC  Recent Labs Lab 01/05/15 0631  WBC 6.6  HGB 6.8*  HCT 20.5*  PLT 144*   ------------------------------------------------------------------------------------------------------------------  Chemistries   Recent Labs Lab 01/02/15 1924  01/05/15 0631  NA 141  < > 140  K 4.9  < > 4.5  CL 108  < > 105  CO2 23  < > 30  GLUCOSE 306*  < > 190*  BUN 75*  < > 34*  CREATININE 7.94*  < > 4.83*  CALCIUM 8.0*  < > 7.5*  AST 10*  --   --   ALT 11*  --   --   ALKPHOS 57  --   --   BILITOT <0.1*  --   --   < > = values in this interval not displayed. ------------------------------------------------------------------------------------------------------------------  Cardiac Enzymes  Recent Labs Lab 01/03/15 1150  TROPONINI 0.03   ------------------------------------------------------------------------------------------------------------------  RADIOLOGY:  No results found.  EKG:   Orders placed or performed during the hospital encounter of 01/02/15  . ED EKG  . ED EKG    ASSESSMENT AND PLAN:   79 year old male with past medical history significant for CK D stage V following with [facility] nephrology, diabetes mellitus, sleep apnea, history of bladder cancer, COPD admitted for shortness of breath and noted to be in acute renal  failure and also diastolic CHF exacerbation.   #1 acute pulmonary edema-secondary to acute diastolic CHF exacerbation and also poor renal function. -Admitted to telemetry, decreased urine  output. -started on dialysis. Echocardiogram done. Continue oxygen support for now. - slowly improving -Ambulate with physical therapy and check oxygen saturation to evaluate for any home oxygen needs  #2 end-stage renal disease-follows with [facility] nephrology as outpatient. -Nephrology and vascular consulted.  -permacath placed and dialysis started 01/03/15- third session today - Her manager assisting to set up outpatient dialysis prior to discharge - cause for ESRD- HTN and DM  #3 hypertension-poorly controlled hypertension as outpatient.  - Patient is on  metoprolol, hydralazine, Lasix, clonidine, doxazosin and norvasc. - Also recommended low-sodium diet. -Blood pressure better today - IV hydralazine prn  #4 diabetes mellitus-insulin-dependent. Hypoglycemic event and now improving - discontinued Lantus, continue SSI - change diet to soft diet as patient has teeth issues and nepro supplements -Sugars are less than 200 today. Continue to monitor for today and start low-dose Lantus if needed tomorrow.  #5 history of bladder cell cancer-status post TURBT in 2013, no obstructive symptoms at this time. Follow up with urology as outpatient.  #6 anemia of chronic kidney disease-hemoglobin is down to 6.8 - will transfuse 1 unit with dialysis today . Likely will need epogen with dialysis.  #7 DVT Prophylaxis- Heparin SQ   All the records are reviewed and case discussed with Care Management/Social Workerr. Management plans discussed with the patient, family and they are in agreement.  CODE STATUS: Full code  TOTAL TIME TAKING CARE OF THIS PATIENT: 37 minutes.   POSSIBLE D/C tomorrow, DEPENDING ON CLINICAL CONDITION.   Gladstone Lighter M.D on 01/05/2015 at 10:39 AM  Between 7am to 6pm - Pager - [phone]  After 6pm go to [email or web address] - password [email or web address]  Office  [phone]  CC: Primary care physician; Pcp Not In System

## 2015-01-05 NOTE — Progress Notes (Signed)
Patient tolerated well sitting up in chair for dialysis for 3hour treatment.

## 2015-01-05 NOTE — Progress Notes (Signed)
Pre-hd tx 

## 2015-01-05 NOTE — Progress Notes (Signed)
HD tx completed.

## 2015-01-05 NOTE — Progress Notes (Signed)
Pt is refusing Cpap and doesn't have one in the room.

## 2015-01-05 NOTE — Care Management (Signed)
Patient agreeable to have home health if MD orders and agency preference from list is [facility].  Heads up referral called to [place].  Patient's actual street address is [place].  Confirmed phone number.  Still awaiting qualifying 02 sats to be obtained.

## 2015-01-05 NOTE — Progress Notes (Signed)
Post hd tx 

## 2015-01-06 DIAGNOSIS — N186 End stage renal disease: Secondary | ICD-10-CM

## 2015-01-06 DIAGNOSIS — I5031 Acute diastolic (congestive) heart failure: Secondary | ICD-10-CM

## 2015-01-06 DIAGNOSIS — Z992 Dependence on renal dialysis: Secondary | ICD-10-CM

## 2015-01-06 LAB — HEPATITIS B CORE ANTIBODY, TOTAL: Hep B Core Total Ab: NEGATIVE

## 2015-01-06 LAB — BASIC METABOLIC PANEL
ANION GAP: 4 — AB (ref 5–15)
BUN: 22 mg/dL — ABNORMAL HIGH (ref 6–20)
CHLORIDE: 104 mmol/L (ref 101–111)
CO2: 31 mmol/L (ref 22–32)
Calcium: 8 mg/dL — ABNORMAL LOW (ref 8.9–10.3)
Creatinine, Ser: 3.48 mg/dL — ABNORMAL HIGH (ref 0.61–1.24)
GFR calc Af Amer: 18 mL/min — ABNORMAL LOW (ref >60)
GFR, EST NON AFRICAN AMERICAN: 16 mL/min — AB (ref >60)
GLUCOSE: 177 mg/dL — AB (ref 65–99)
POTASSIUM: 4.2 mmol/L (ref 3.5–5.1)
Sodium: 139 mmol/L (ref 135–145)

## 2015-01-06 LAB — HEPATITIS B CORE ANTIBODY, IGM: HEP B C IGM: NEGATIVE

## 2015-01-06 LAB — CBC
HEMATOCRIT: 25.2 % — AB (ref 40.0–52.0)
HEMOGLOBIN: 8.4 g/dL — AB (ref 13.0–18.0)
MCH: 29.1 pg (ref 26.0–34.0)
MCHC: 33.3 g/dL (ref 32.0–36.0)
MCV: 87.5 fL (ref 80.0–100.0)
Platelets: 151 10*3/uL (ref 150–440)
RBC: 2.89 MIL/uL — ABNORMAL LOW (ref 4.40–5.90)
RDW: 14.7 % — ABNORMAL HIGH (ref 11.5–14.5)
WBC: 8.2 10*3/uL (ref 3.8–10.6)

## 2015-01-06 LAB — PARATHYROID HORMONE, INTACT (NO CA): PTH: 1300 pg/mL — AB (ref 15–65)

## 2015-01-06 LAB — GLUCOSE, CAPILLARY
Glucose-Capillary: 168 mg/dL — ABNORMAL HIGH (ref 65–99)
Glucose-Capillary: 236 mg/dL — ABNORMAL HIGH (ref 65–99)

## 2015-01-06 LAB — HEPATITIS B SURFACE ANTIBODY,QUALITATIVE: Hep B S Ab: NONREACTIVE

## 2015-01-06 LAB — HEPATITIS C ANTIBODY: HCV Ab: <0.1 s/co ratio (ref 0.0–0.9)

## 2015-01-06 MED ORDER — CALCIUM ACETATE (PHOS BINDER) 667 MG PO CAPS: 1334.0000 mg | ORAL_CAPSULE | Freq: Three times a day (TID) | ORAL | Status: AC

## 2015-01-06 MED ORDER — AMLODIPINE BESYLATE 10 MG PO TABS: 10.0000 mg | ORAL_TABLET | Freq: Every day | ORAL | Status: DC

## 2015-01-06 MED ORDER — FUROSEMIDE 40 MG PO TABS: 40.0000 mg | ORAL_TABLET | Freq: Every day | ORAL | Status: DC

## 2015-01-06 MED ORDER — ALPRAZOLAM 0.25 MG PO TABS: 0.2500 mg | ORAL_TABLET | Freq: Three times a day (TID) | ORAL | Status: DC | PRN

## 2015-01-06 MED ORDER — ATORVASTATIN CALCIUM 20 MG PO TABS: 20.0000 mg | ORAL_TABLET | Freq: Every day | ORAL | Status: DC

## 2015-01-06 MED ORDER — INSULIN DETEMIR 100 UNIT/ML ~~LOC~~ SOLN: 10.0000 [IU] | Freq: Every day | SUBCUTANEOUS | Status: DC

## 2015-01-06 MED ORDER — CLONIDINE HCL 0.1 MG PO TABS: 0.1000 mg | ORAL_TABLET | Freq: Two times a day (BID) | ORAL | Status: AC

## 2015-01-06 NOTE — Progress Notes (Signed)
Pt discharged to home via wc.  Instructions and rx given to pt.  Questions answered.  No distress.  

## 2015-01-06 NOTE — Discharge Summary (Signed)
[place] at [place] NAME: Jason Aguilar    MR#:  [id number]  DATE OF BIRTH:  January 21, 1936  DATE OF ADMISSION:  01/02/2015 ADMITTING PHYSICIAN: Lytle Butte, MD  DATE OF DISCHARGE: 01/06/15  PRIMARY CARE PHYSICIAN: Pcp Not In System    ADMISSION DIAGNOSIS:  Hyperglycemia [R73.9] Elevated troponin [R79.89] Secondary hypertension, unspecified [I15.9] Acute renal failure, unspecified acute renal failure type (HCC) [N17.9] Acute congestive heart failure, unspecified congestive heart failure type ([place]) [I50.9]  DISCHARGE DIAGNOSIS:  Principal Problem:   Acute diastolic CHF (congestive heart failure) (HCC) Active Problems:   Hypertensive urgency   ESRD on dialysis ([place])   SECONDARY DIAGNOSIS:   Past Medical History  Diagnosis Date  . Glaucoma     both eyes Drs. Arrie [place] and [place]  . Hematuria syndrome     s/p negative cystoscopy  . GERD (gastroesophageal reflux disease) 2003    EGD  . Allergy   . Hyperlipidemia   . Obstructive sleep apnea of adult     CPAP at 8 cm H20  . Noncompliance with medication regimen   . 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 by [facility]: low grade noninvasive  . Chronic kidney disease (CKD), stage III (moderate)     with proteinuria  . Glaucoma   . Hypertension   . Diabetes mellitus     Type 2  . Cataract due to secondary diabetes mellitus ([place])   . COPD (chronic obstructive pulmonary disease) ([place])     secondary to tobacco abuse  . History of tobacco abuse     60 pack year history; quit 1980    HOSPITAL COURSE:   79 year old male with past medical history significant for CK D stage V following with [facility] nephrology, diabetes mellitus, sleep apnea, history of bladder cancer, COPD admitted for shortness of breath and noted to be in acute renal failure and also diastolic CHF exacerbation.   #1 Acute pulmonary edema-secondary to acute diastolic CHF exacerbation and also poor renal  function. -started on lasix and also dialysis started. - requiring 2L o2 -Echocardiogram done this admission, normal systolic function. Continue oxygen support for now. -worked with physical therapy today  #2 End-stage renal disease-follows with [facility] nephrology as outpatient. -Nephrology and vascular consulted.  -permacath placed and dialysis started 01/03/15- received 3 sessions here -outpatient dialysis set up for Monday, Wednesday and Friday- starting this Monday 01/09/15 at 2:30PM - cause for ESRD- HTN and DM  #3 Hypertension-poorly controlled hypertension as outpatient. Improving with medication adjustments and starting dialysis - Patient is on metoprolol, hydralazine, Lasix, clonidine, doxazosin and norvasc. - Also recommended low-sodium diet.  #4 diabetes mellitus-insulin-dependent. Had hypoglycemic event in hospital - levemir was held and now restarted at low dose of 10units as sugars started to go into 200's.  #5 history of bladder cell cancer-status post TURBT in 2013, no obstructive symptoms at this time. Follow up with urology as outpatient.  #6 anemia of chronic kidney disease- received 1unit Tx this hospitalisation for hb of 6.8 Hb improved to 8.4 today  Likely will need epogen with dialysis.  For discharge with home health today  DISCHARGE CONDITIONS:   Stable  CONSULTS OBTAINED:  Treatment Team:  Lytle Butte, MD Lavonia Dana, MD Nicholes Mango, MD Katha Cabal, MD  DRUG ALLERGIES:   Allergies  Allergen Reactions  . Losartan Swelling  . Dristan Rash    DISCHARGE MEDICATIONS:   Current Discharge Medication List  START taking these medications   Details  ALPRAZolam (XANAX) 0.25 MG tablet Take 1 tablet (0.25 mg total) by mouth 3 (three) times daily as needed for anxiety. Qty: 20 tablet, Refills: 0    atorvastatin (LIPITOR) 20 MG tablet Take 1 tablet (20 mg total) by mouth daily at 6 PM. Qty: 30 tablet, Refills: 2    calcium acetate  (PHOSLO) 667 MG capsule Take 2 capsules (1,334 mg total) by mouth 3 (three) times daily with meals. Qty: 180 capsule, Refills: 2    cloNIDine (CATAPRES) 0.1 MG tablet Take 1 tablet (0.1 mg total) by mouth 2 (two) times daily. Qty: 60 tablet, Refills: 2      CONTINUE these medications which have CHANGED   Details  amLODipine (NORVASC) 10 MG tablet Take 1 tablet (10 mg total) by mouth daily. Qty: 30 tablet, Refills: 2    furosemide (LASIX) 40 MG tablet Take 1 tablet (40 mg total) by mouth daily. Qty: 30 tablet, Refills: 2    insulin detemir (LEVEMIR) 100 UNIT/ML injection Inject 0.1 mLs (10 Units total) into the skin at bedtime. Qty: 10 mL, Refills: 2      CONTINUE these medications which have NOT CHANGED   Details  doxazosin (CARDURA) 8 MG tablet Take 8 mg by mouth daily.    hydrALAZINE (APRESOLINE) 100 MG tablet Take 100 mg by mouth 3 (three) times daily.    metoprolol (LOPRESSOR) 50 MG tablet Take 50 mg by mouth 2 (two) times daily.      sodium bicarbonate 650 MG tablet Take 650 mg by mouth 2 (two) times daily.      STOP taking these medications     simvastatin (ZOCOR) 40 MG tablet          DISCHARGE INSTRUCTIONS:   1. PCP f/u in 1 week 2. For dialysis on Monday, Wednesday and Friday at 2:30PM with [facility] [address] 3. Nephrology f/u with [facility] nephrology in 3 days  If you experience worsening of your admission symptoms, develop shortness of breath, life threatening emergency, suicidal or homicidal thoughts you must seek medical attention immediately by calling 911 or calling your MD immediately  if symptoms less severe.  You Must read complete instructions/literature along with all the possible adverse reactions/side effects for all the Medicines you take and that have been prescribed to you. Take any new Medicines after you have completely understood and accept all the possible adverse reactions/side effects.   Please note  You were cared for by a  hospitalist during your hospital stay. If you have any questions about your discharge medications or the care you received while you were in the hospital after you are discharged, you can call the unit and asked to speak with the hospitalist on call if the hospitalist that took care of you is not available. Once you are discharged, your primary care physician will handle any further medical issues. Please note that NO REFILLS for any discharge medications will be authorized once you are discharged, as it is imperative that you return to your primary care physician (or establish a relationship with a primary care physician if you do not have one) for your aftercare needs so that they can reassess your need for medications and monitor your lab values.    Today   CHIEF COMPLAINT:   Chief Complaint  Patient presents with  . Shortness of Breath    VITAL SIGNS:  Blood pressure 154/44, pulse 53, temperature 97.8 F (36.6 C), temperature source Oral,  resp. rate 18, height 5\' 5"  (1.651 m), weight 76.885 kg (169 lb 8 oz), SpO2 97 %.  I/O:   Intake/Output Summary (Last 24 hours) at 01/06/15 1202 Last data filed at 01/06/15 1100  Gross per 24 hour  Intake    853 ml  Output   2775 ml  Net  -1922 ml    PHYSICAL EXAMINATION:   Physical Exam  GENERAL: 79 y.o.-year-old obese patient lying in the bed, appears more comfortable and not in distress today EYES: Pupils equal, round, reactive to light and accommodation. No scleral icterus. Extraocular muscles intact.  HEENT: Head atraumatic, normocephalic. Oropharynx and nasopharynx clear.  NECK: Supple, no jugular venous distention. No thyroid enlargement, no tenderness.  LUNGS: Decreased bibasilar breath sounds with Fine bibasilar crackles heard. No wheezes or rhonchi heard. No use of accessory muscles for breathing CARDIOVASCULAR: S1, S2 normal. No murmurs, rubs, or gallops.  ABDOMEN: Soft, nontender, nondistended. Bowel sounds present. No  organomegaly or mass.  EXTREMITIES: No pedal edema, cyanosis, or clubbing.  NEUROLOGIC: Cranial nerves II through XII are intact. Muscle strength 5/5 in all extremities. Sensation intact. Gait not checked.  PSYCHIATRIC: The patient is alert and oriented x 3. Impaired Cognitive ability SKIN: No obvious rash, lesion, or ulcer.   DATA REVIEW:   CBC  Recent Labs Lab 01/06/15 0518  WBC 8.2  HGB 8.4*  HCT 25.2*  PLT 151    Chemistries   Recent Labs Lab 01/02/15 1924  01/06/15 0518  NA 141  < > 139  K 4.9  < > 4.2  CL 108  < > 104  CO2 23  < > 31  GLUCOSE 306*  < > 177*  BUN 75*  < > 22*  CREATININE 7.94*  < > 3.48*  CALCIUM 8.0*  < > 8.0*  AST 10*  --   --   ALT 11*  --   --   ALKPHOS 57  --   --   BILITOT <0.1*  --   --   < > = values in this interval not displayed.  Cardiac Enzymes  Recent Labs Lab 01/03/15 1150  TROPONINI 0.03    Microbiology Results  Results for orders placed or performed in visit on 07/24/13  Culture, blood (single)     Status: None   Collection Time: 07/24/13 12:53 AM  Result Value Ref Range Status   Micro Text Report   Final       ORGANISM 1                COAGULASE NEGATIVE STAPHYLOCOCCUS   COMMENT                   IN AEROBIC BOTTLE ONLY   COMMENT                   POSSIBLE CONTAMINATION W/SKIN FLORA   GRAM STAIN                GRAM POSITIVE COCCI IN CLUSTERS   ANTIBIOTIC                                                      Culture, blood (single)     Status: None   Collection Time: 07/24/13 12:53 AM  Result Value Ref Range Status   Micro Text Report  Final       COMMENT                   NO GROWTH AEROBICALLY/ANAEROBICALLY IN 5 DAYS   ANTIBIOTIC                                                        RADIOLOGY:  No results found.  EKG:   Orders placed or performed during the hospital encounter of 01/02/15  . ED EKG  . ED EKG      Management plans discussed with the patient, family and they are in  agreement.  CODE STATUS:     Code Status Orders        Start     Ordered   01/02/15 2143  Full code   Continuous     01/02/15 2142      TOTAL TIME TAKING CARE OF THIS PATIENT: 40 minutes.    Gladstone Lighter M.D on 01/06/2015 at 12:02 PM  Between 7am to 6pm - Pager - [phone]  After 6pm go to [email or web address] - password [email or web address] [place] [facility]  Office  [phone]  CC: Primary care physician; Pcp Not In System

## 2015-01-06 NOTE — Care Management Important Message (Signed)
Important Message  Patient Details  Name: Jason Aguilar MRN: [id number] Date of Birth: 1935-04-25   Medicare Important Message Given:  Yes    Juliann Pulse A Abrham Maslowski 01/06/2015, 11:15 AM

## 2015-01-06 NOTE — Evaluation (Signed)
Physical Therapy Evaluation Patient Details Name: Jason Aguilar MRN: [id number] DOB: 1935-11-01 Today's Date: 01/06/2015   History of Present Illness  Jason Aguilar is a 79 y.o. male with a known history of chronic kidney disease unknown baseline however supposedly supposed to start dialysis soon presenting with shortness of breath. He describes personally 1 month's duration of shortness of breath however progressively worsening of the last 2 week duration. He mainly describes dyspnea on exertion, worsening lower extremity edema as well as orthopnea dry nonproductive cough denies any chest pain fevers chills further symptomatology. Given above symptoms present to Hospital further workup and evaluation  Clinical Impression  Pt demonstrates reasonable strength/mobility with bed mobility, transfers, and ambulation. SaO2 drops during ambulation on room air requiring supplemental O2 (see gait section). Overall balance is decreased with head turns during gait which would benefit from intermittent use of rolling walker due to deconditioning. Positive Rhomberg within 5 seconds and single leg balance <2 seconds on both LEs. Pt will need HH PT, intermittent assist at home, rolling walker, and supplemental O2 at discharge. Pt will benefit from skilled PT services to address deficits in strength, balance, and mobility in order to return to full function at home.      Follow Up Recommendations Home health PT;Supervision - Intermittent    Equipment Recommendations  Rolling walker with 5" wheels;Other (comment) (Home O2)    Recommendations for Other Services       Precautions / Restrictions Precautions Precautions: Fall Restrictions Weight Bearing Restrictions: No      Mobility  Bed Mobility Overal bed mobility: Needs Assistance Bed Mobility: Supine to Sit;Sit to Supine     Supine to sit: Supervision Sit to supine: Supervision   General bed mobility comments: Good strength, stability and  movement. Pt reports increased SOB with bed mobility  Transfers Overall transfer level: Needs assistance Equipment used: Rolling walker (2 wheeled) Transfers: Sit to/from Stand Sit to Stand: Min guard         General transfer comment: Pt with decreased balance but overall stable without overt LOB. Fair LE strength noted  Ambulation/Gait Ambulation/Gait assistance: Min guard Ambulation Distance (Feet): 160 Feet (80+80) Assistive device: Rolling walker (2 wheeled);None Gait Pattern/deviations: Decreased step length - right;Decreased step length - left Gait velocity: Decreased but functional Gait velocity interpretation: Below normal speed for age/gender General Gait Details: Pt demonstrates reasonable stability during ambulation with rolling walker. Able to progress to no assistive device and demonstrates fair stability but staggers left and right with horizontal head turns and gait slows. On room air SaO2 at 92% at rest in bed. During ambulation SaO2 drops to 86%, SaO2 reapplied at 2L/min and SaO2 rebounds to 95%.  Stairs            Wheelchair Mobility    Modified Rankin (Stroke Patients Only)       Balance Overall balance assessment: Needs assistance   Sitting balance-Leahy Scale: Good       Standing balance-Leahy Scale: Fair                               Pertinent Vitals/Pain Pain Assessment: No/denies pain    Home Living Family/patient expects to be discharged to:: Private residence Living Arrangements: Spouse/significant other;Children (Daughter) Available Help at Discharge: Family Type of Home: Mobile home Home Access: Ramped entrance     Home Layout: One level Home Equipment: Cane - single point (no BSC, no hospital bed)  Prior Function Level of Independence: Independent with assistive device(s)         Comments: Intermittent use of spc. Pt drives. Limited community ambulator due to limited cardiopulmonary status     Hand  Dominance        Extremity/Trunk Assessment   Upper Extremity Assessment: Overall WFL for tasks assessed           Lower Extremity Assessment: Overall WFL for tasks assessed (No focal weakness)         Communication   Communication: No difficulties  Cognition Arousal/Alertness: Awake/alert Behavior During Therapy: WFL for tasks assessed/performed Overall Cognitive Status: Within Functional Limits for tasks assessed                      General Comments      Exercises        Assessment/Plan    PT Assessment Patient needs continued PT services  PT Diagnosis Difficulty walking;Generalized weakness;Abnormality of gait   PT Problem List Decreased strength;Decreased activity tolerance;Decreased balance;Cardiopulmonary status limiting activity  PT Treatment Interventions DME instruction;Gait training;Stair training;Therapeutic activities;Therapeutic exercise;Balance training;Neuromuscular re-education;Patient/family education   PT Goals (Current goals can be found in the Care Plan section) Acute Rehab PT Goals Patient Stated Goal: "I want to get stronger." PT Goal Formulation: With patient Time For Goal Achievement: 01/20/15 Potential to Achieve Goals: Good    Frequency Min 2X/week   Barriers to discharge        Co-evaluation               End of Session Equipment Utilized During Treatment: Gait belt Activity Tolerance: Patient tolerated treatment well Patient left: Other (comment) (in bathroom with CNA) Nurse Communication: Mobility status;Other (comment) (Exertional sats)         Time: [id number] PT Time Calculation (min) (ACUTE ONLY): 25 min   Charges:   PT Evaluation $Initial PT Evaluation Tier I: 1 Procedure PT Treatments $Gait Training: 8-22 mins   PT G Codes:       Lyndel Safe Jason Aguilar PT, DPT   Jason Aguilar,Jason Aguilar 01/06/2015, 9:55 AM

## 2015-01-06 NOTE — Discharge Instructions (Signed)
[facility] [place]  DIALYSIS Monday December 12    AT 2:30P AT [facility] [place].  YOUR DIALYSIS DAYS ARE Monday - Wednesday - Fridays  Phone number  [phone]

## 2015-01-06 NOTE — Care Management (Signed)
Patient is for discharge today.  Has qualified for home 02 and rolling walker.  Agency preference for DME is [facility].  Obtained chair time for patient's dialysis- Jason Aguilar M W F at 2:30 p. Placed this information on discharge instructions.  Spoke with [place] with [facility] and requested start of care 12/10.  Informed this could be accommodated

## 2015-01-06 NOTE — Progress Notes (Signed)
Social work consult, patient is new HD.  Patient will DC home with home health supportive services.  Care Manager setting up all needed services.  Patient set up with [facility].  MWF  2:30.   CSW signing off no CSW services needs at this time.  Casimer Lanius. LCSWA, MSW Clinical Social Work Department 9:53 AM

## 2015-01-06 NOTE — Progress Notes (Signed)
[facility]  ROUNDING NOTE   Subjective:   Working with PT this morning.   Initial hemodialysis, 12/6. Completed three treatments   Objective:  Vital signs in last 24 hours:  Temp:  [97.4 F (36.3 C)-98 F (36.7 C)] 98 F (36.7 C) (12/09 0743) Pulse Rate:  [55-65] 58 (12/09 0743) Resp:  [15-22] 17 (12/09 0743) BP: (110-197)/(35-66) 175/60 mmHg (12/09 0743) SpO2:  [93 %-99 %] 98 % (12/09 0743) Weight:  [76.885 kg (169 lb 8 oz)] 76.885 kg (169 lb 8 oz) (12/09 0528)  Weight change: 2.218 kg (4 lb 14.2 oz) Filed Weights   01/05/15 0530 01/05/15 0930 01/06/15 0528  Weight: 74.118 kg (163 lb 6.4 oz) 74.118 kg (163 lb 6.4 oz) 76.885 kg (169 lb 8 oz)    Intake/Output: I/O last 3 completed shifts: In: 373 [Blood:373] Out: [id number] [Urine:1150; Other:2000]   Intake/Output this shift:  Total I/O In: -  Out: 175 [Urine:175]  Physical Exam: General: NAD, elderly, frail  Head: Normocephalic, atraumatic. Moist oral mucosal membranes, hard of hearing  Eyes: Anicteric, PERRL  Neck: Supple, trachea midline  Lungs:  Bilateral basilar crackles  Heart: Regular rate and rhythm  Abdomen:  Soft, nontender,   Extremities: trace peripheral edema.  Neurologic: Nonfocal, moving all four extremities  Skin: No lesions  Access: RIJ permcath - no active bleeding: Dr. Delana Meyer [id number]    Basic Metabolic Panel:  Recent Labs Lab 01/03/15 0538 01/03/15 1150 01/03/15 1847 01/04/15 0458 01/05/15 0631 01/06/15 0518  NA 140  --  142 142 140  140 139  K 4.4  --  4.9 4.0 4.5  4.5 4.2  CL 108  --  108 108 104  105 104  CO2 22  --  21* 27 30  30 31   GLUCOSE 143*  --  190* 97 192*  190* 177*  BUN 75*  --  76* 53* 32*  34* 22*  CREATININE 8.20*  --  8.04* 6.43* 4.68*  4.83* 3.48*  CALCIUM 7.6*  --  7.9* 7.8* 7.7*  7.5* 8.0*  PHOS  --  7.7* 8.2*  --  4.2  --     Liver Function Tests:  Recent Labs Lab 01/02/15 1924 01/03/15 1847 01/05/15 0631  AST 10*  --   --   ALT 11*   --   --   ALKPHOS 57  --   --   BILITOT <0.1*  --   --   PROT 5.4*  --   --   ALBUMIN 3.0* 2.8* 2.3*   No results for input(s): LIPASE, AMYLASE in the last 168 hours. No results for input(s): AMMONIA in the last 168 hours.  CBC:  Recent Labs Lab 01/02/15 1924 01/03/15 0538 01/03/15 1847 01/04/15 0458 01/05/15 0631 01/06/15 0518  WBC 9.7 8.2 8.4 7.6 6.6 8.2  NEUTROABS 7.7*  --   --   --   --   --   HGB 9.6* 8.6* 8.5* 7.8* 6.8* 8.4*  HCT 29.2* 26.4* 25.6* 23.9* 20.5* 25.2*  MCV 88.5 87.9 88.7 87.4 87.4 87.5  PLT 205 183 189 164 144* 151    Cardiac Enzymes:  Recent Labs Lab 01/02/15 1924 01/02/15 2342 01/03/15 0538 01/03/15 1150  TROPONINI 0.04* 0.03 0.05* 0.03    BNP: Invalid input(s): POCBNP  CBG:  Recent Labs Lab 01/05/15 0725 01/05/15 1327 01/05/15 1529 01/05/15 1727 01/06/15 0737  GLUCAP 176* 122* 283* 277* 168*    Microbiology: Results for orders placed or performed in visit on 07/24/13  Culture, blood (single)     Status: None   Collection Time: 07/24/13 12:53 AM  Result Value Ref Range Status   Micro Text Report   Final       ORGANISM 1                COAGULASE NEGATIVE STAPHYLOCOCCUS   COMMENT                   IN AEROBIC BOTTLE ONLY   COMMENT                   POSSIBLE CONTAMINATION W/SKIN FLORA   GRAM STAIN                GRAM POSITIVE COCCI IN CLUSTERS   ANTIBIOTIC                                                      Culture, blood (single)     Status: None   Collection Time: 07/24/13 12:53 AM  Result Value Ref Range Status   Micro Text Report   Final       COMMENT                   NO GROWTH AEROBICALLY/ANAEROBICALLY IN 5 DAYS   ANTIBIOTIC                                                        Coagulation Studies: No results for input(s): LABPROT, INR in the last 72 hours.  Urinalysis: No results for input(s): COLORURINE, LABSPEC, PHURINE, GLUCOSEU, HGBUR, BILIRUBINUR, KETONESUR, PROTEINUR, UROBILINOGEN, NITRITE,  LEUKOCYTESUR in the last 72 hours.  Invalid input(s): APPERANCEUR    Imaging: No results found.   Medications:     . amLODipine  10 mg Oral Daily  . atorvastatin  20 mg Oral q1800  . calcium acetate  1,334 mg Oral TID WC  . cloNIDine  0.1 mg Oral BID  . doxazosin  8 mg Oral Daily  . feeding supplement (NEPRO CARB STEADY)  237 mL Oral TID WC  . furosemide  40 mg Intravenous Daily  . heparin  5,000 Units Subcutaneous Q12H  . hydrALAZINE  100 mg Oral TID  . insulin aspart  0-5 Units Subcutaneous QHS  . insulin aspart  0-9 Units Subcutaneous TID WC  . metoprolol  50 mg Oral BID  . sodium bicarbonate  650 mg Oral BID   acetaminophen **OR** acetaminophen, ALPRAZolam, hydrALAZINE, ipratropium-albuterol, morphine injection, ondansetron **OR** ondansetron (ZOFRAN) IV, oxyCODONE  Assessment/ Plan:  [name] is a 79 y.o. white male with hypertension, hyperlipidemia, diabetes mellitus type II Insulin Dependent, diabetic retinopathy, BPH, obstructive sleep apnea who presents Hyperglycemia [R73.9] Elevated troponin [R79.89] Secondary hypertension, unspecified [I15.9] Acute renal failure, unspecified acute renal failure type ([place]) [N17.9] Acute congestive heart failure, unspecified congestive heart failure type ([place]) [I50.9]  1. End stage renal disease: first dialysis 12/6  Will plan on [place]. Discussed case with Dr. Smith Mince.   2. Hypertension: not well controlled. Volume driven.  - doxazosin, furosemide, hydralazine, and metoprolol - not on an ACE-I/ARB due to advanced renal disease.  Will need to restart as an outpatient.   3. Diabetes Mellitus type II Insulin Dependent with chronic kidney disease: continue glucose control. Hemoglobin A1c of 7.7%.   4. Anemia of chronic kidney disease:  Hemoglobin 8.4. PRBC transfusion 12/8 - epo with dialysis treatment.  - iron studies consistent with iron deficiency.   5. Secondary Hyperparathyroidism:  Corrected calcium at goal. Phos 4.2. Not currently on binders or activated vitamin D.  - Phos elevated. PTH 1300 - Started calcium acetate with meals.     LOS: [place], [place] 12/9/20169:44 AM

## 2015-01-06 NOTE — Progress Notes (Signed)
Pt decided to wear CPAP for sleep. Pt placed on auto CPAP for sleep with 2L O2 bled in line.

## 2015-01-06 NOTE — Progress Notes (Signed)
SATURATION QUALIFICATIONS: (This note is used to comply with regulatory documentation for home oxygen)  Patient Saturations on Room Air at Rest = 92%  Patient Saturations on Room Air while Ambulating = 86%  Patient Saturations on 2 Liters of oxygen while Ambulating = 95%  Please briefly explain why patient needs home oxygen: 

## 2015-01-13 NOTE — Care Management (Signed)
Informed by [facility].  After many many attempts to make initial visit agency has had to close the referral.  Patient

## 2015-01-17 LAB — TYPE AND SCREEN
ABO/RH(D): A NEG
Antibody Screen: NEGATIVE
UNIT DIVISION: 0
UNIT DIVISION: 0

## 2015-01-20 ENCOUNTER — Ambulatory Visit: Admit: 2015-01-20 | Payer: Self-pay | Admitting: Vascular Surgery

## 2015-01-20 SURGERY — ARTERIOVENOUS (AV) FISTULA CREATION
Anesthesia: General | Laterality: Left

## 2015-03-03 ENCOUNTER — Encounter
Admission: RE | Admit: 2015-03-03 | Discharge: 2015-03-03 | Disposition: A | Discharge status: Home / Self Care | Payer: Medicare Other | Source: Ambulatory Visit | Attending: Vascular Surgery | Admitting: Vascular Surgery

## 2015-03-03 DIAGNOSIS — Z01812 Encounter for preprocedural laboratory examination: Secondary | ICD-10-CM | POA: Diagnosis not present

## 2015-03-03 DIAGNOSIS — Z01810 Encounter for preprocedural cardiovascular examination: Secondary | ICD-10-CM | POA: Insufficient documentation

## 2015-03-03 LAB — BASIC METABOLIC PANEL
ANION GAP: 13 (ref 5–15)
BUN: 31 mg/dL — AB (ref 6–20)
CALCIUM: 10.5 mg/dL — AB (ref 8.9–10.3)
CHLORIDE: 97 mmol/L — AB (ref 101–111)
CO2: 21 mmol/L — AB (ref 22–32)
Creatinine, Ser: 4.16 mg/dL — ABNORMAL HIGH (ref 0.61–1.24)
GFR, EST AFRICAN AMERICAN: 15 mL/min — AB (ref >60)
GFR, EST NON AFRICAN AMERICAN: 13 mL/min — AB (ref >60)
GLUCOSE: 547 mg/dL — AB (ref 65–99)
POTASSIUM: 5.1 mmol/L (ref 3.5–5.1)
SODIUM: 131 mmol/L — AB (ref 135–145)

## 2015-03-03 LAB — TYPE AND SCREEN
ABO/RH(D): A NEG
ANTIBODY SCREEN: NEGATIVE
Extend sample reason: TRANSFUSED

## 2015-03-03 LAB — CBC
HCT: 39.1 % — ABNORMAL LOW (ref 40.0–52.0)
Hemoglobin: 13.3 g/dL (ref 13.0–18.0)
MCH: 30.4 pg (ref 26.0–34.0)
MCHC: 34.1 g/dL (ref 32.0–36.0)
MCV: 89 fL (ref 80.0–100.0)
Platelets: 269 10*3/uL (ref 150–440)
RBC: 4.39 MIL/uL — ABNORMAL LOW (ref 4.40–5.90)
RDW: 16.8 % — AB (ref 11.5–14.5)
WBC: 9.7 10*3/uL (ref 3.8–10.6)

## 2015-03-03 LAB — SURGICAL PCR SCREEN
MRSA, PCR: NEGATIVE
Staphylococcus aureus: POSITIVE — AB

## 2015-03-03 NOTE — Patient Instructions (Signed)
  Your procedure is scheduled on: Friday 03/10/15 Report to Day Surgery. 2nd floor medical mall entrance To find out your arrival time please call [phone] between 1PM - 3PM on Thursday 03/09/15.  Remember: Instructions that are not followed completely may result in serious medical risk, up to and including death, or upon the discretion of your surgeon and anesthesiologist your surgery may need to be rescheduled.    __X__ 1. Do not eat food or drink liquids after midnight. No gum chewing or hard candies.     __X__ 2. No Alcohol for 24 hours before or after surgery.   ____ 3. Bring all medications with you on the day of surgery if instructed.    __X__ 4. Notify your doctor if there is any change in your medical condition     (cold, fever, infections).     Do not wear jewelry, make-up, hairpins, clips or nail polish.  Do not wear lotions, powders, or perfumes.   Do not shave 48 hours prior to surgery. Men may shave face and neck.  Do not bring valuables to the hospital.    [facility] is not responsible for any belongings or valuables.               Contacts, dentures or bridgework may not be worn into surgery.  Leave your suitcase in the car. After surgery it may be brought to your room.  For patients admitted to the hospital, discharge time is determined by your                treatment team.   Patients discharged the day of surgery will not be allowed to drive home.   Please read over the following fact sheets that you were given:   MRSA Information and Surgical Site Infection Prevention   __X__ Take these medicines the morning of surgery with A SIP OF WATER:    1. AMLODIPINE  2. CLONIDINE  3. HYDRALAZINE  4. METOPROLOL  5.  6.  ____ Fleet Enema (as directed)   __X__ Use CHG Soap as directed (SAGE WIPES)  ____ Use inhalers on the day of surgery  ____ Stop metformin 2 days prior to surgery    __X__ Take 1/2 of usual insulin dose the night before surgery and none on  the morning of surgery.   ____ Stop Coumadin/Plavix/aspirin on   ____ Stop Anti-inflammatories on    ____ Stop supplements until after surgery.    __X__ Bring C-Pap to the hospital.

## 2015-03-03 NOTE — Pre-Procedure Instructions (Signed)
Dr. Ronelle Nigh notified pt Glucose 547. Medical clearance requested. Mickel Baas at office notified.  request sent

## 2015-03-06 ENCOUNTER — Other Ambulatory Visit: Payer: Self-pay | Admitting: Vascular Surgery

## 2015-03-08 NOTE — Pre-Procedure Instructions (Signed)
Spoke to [employer] at Dr Jefm Bryant office she is checking on clearance. Faxed her a copy of the request.

## 2015-03-09 NOTE — Pre-Procedure Instructions (Signed)
Contacted Dr. Rolm Gala nurse Butch Penny regarding medical clearance request faxed on 03/03/2015.  Butch Penny is unable to find faxed medical clearance.  Pt was last seen in their office in October of 2016 and was scheduled for a 3 month followup and did not show.    This RN refaxes the medical clearance request to Dr. Rolm Gala office, fax printed transmission OK. REquested medical clearance to be faxed to Dr. Nino Parsley office and PAT.  Spoke with nurse Mickel Baas at Dr. Nino Parsley office regarding not receiving medical clearance. Pt will be rescheduled once medical clearance is received.

## 2015-03-09 NOTE — Pre-Procedure Instructions (Signed)
Message left on Dr. Rolm Gala office voicemail regarding status of [facility]'s Medical Clearance.  Requested office call back with update.

## 2015-03-10 ENCOUNTER — Encounter: Admission: RE | Payer: Self-pay | Source: Ambulatory Visit

## 2015-03-10 ENCOUNTER — Ambulatory Visit: Admission: RE | Admit: 2015-03-10 | Payer: Medicare Other | Source: Ambulatory Visit | Admitting: Vascular Surgery

## 2015-03-10 SURGERY — INSERTION OF ARTERIOVENOUS (AV) GORE-TEX GRAFT ARM
Anesthesia: General | Laterality: Left

## 2015-03-28 ENCOUNTER — Encounter: Payer: Self-pay | Admitting: Emergency Medicine

## 2015-03-28 ENCOUNTER — Inpatient Hospital Stay
Admission: EM | Admit: 2015-03-28 | Discharge: 2015-04-05 | DRG: 064 | Disposition: A | Discharge status: Home / Self Care | Payer: Medicare Other | Attending: Internal Medicine | Admitting: Internal Medicine

## 2015-03-28 ENCOUNTER — Emergency Department: Payer: Medicare Other

## 2015-03-28 ENCOUNTER — Emergency Department
Admission: EM | Admit: 2015-03-28 | Discharge: 2015-03-28 | Disposition: A | Discharge status: Home / Self Care | Payer: Medicare Other | Source: Home / Self Care | Attending: Emergency Medicine | Admitting: Emergency Medicine

## 2015-03-28 DIAGNOSIS — E878 Other disorders of electrolyte and fluid balance, not elsewhere classified: Secondary | ICD-10-CM | POA: Insufficient documentation

## 2015-03-28 DIAGNOSIS — K219 Gastro-esophageal reflux disease without esophagitis: Secondary | ICD-10-CM | POA: Diagnosis present

## 2015-03-28 DIAGNOSIS — I10 Essential (primary) hypertension: Secondary | ICD-10-CM | POA: Diagnosis present

## 2015-03-28 DIAGNOSIS — IMO0002 Reserved for concepts with insufficient information to code with codable children: Secondary | ICD-10-CM | POA: Diagnosis present

## 2015-03-28 DIAGNOSIS — F419 Anxiety disorder, unspecified: Secondary | ICD-10-CM | POA: Diagnosis present

## 2015-03-28 DIAGNOSIS — Z87891 Personal history of nicotine dependence: Secondary | ICD-10-CM | POA: Insufficient documentation

## 2015-03-28 DIAGNOSIS — E1165 Type 2 diabetes mellitus with hyperglycemia: Secondary | ICD-10-CM | POA: Diagnosis present

## 2015-03-28 DIAGNOSIS — E1136 Type 2 diabetes mellitus with diabetic cataract: Secondary | ICD-10-CM | POA: Diagnosis present

## 2015-03-28 DIAGNOSIS — I120 Hypertensive chronic kidney disease with stage 5 chronic kidney disease or end stage renal disease: Secondary | ICD-10-CM | POA: Diagnosis present

## 2015-03-28 DIAGNOSIS — E0821 Diabetes mellitus due to underlying condition with diabetic nephropathy: Secondary | ICD-10-CM | POA: Insufficient documentation

## 2015-03-28 DIAGNOSIS — Z794 Long term (current) use of insulin: Secondary | ICD-10-CM

## 2015-03-28 DIAGNOSIS — R451 Restlessness and agitation: Secondary | ICD-10-CM | POA: Insufficient documentation

## 2015-03-28 DIAGNOSIS — N186 End stage renal disease: Secondary | ICD-10-CM

## 2015-03-28 DIAGNOSIS — I6523 Occlusion and stenosis of bilateral carotid arteries: Secondary | ICD-10-CM | POA: Diagnosis present

## 2015-03-28 DIAGNOSIS — R258 Other abnormal involuntary movements: Secondary | ICD-10-CM | POA: Diagnosis present

## 2015-03-28 DIAGNOSIS — J449 Chronic obstructive pulmonary disease, unspecified: Secondary | ICD-10-CM | POA: Diagnosis present

## 2015-03-28 DIAGNOSIS — H409 Unspecified glaucoma: Secondary | ICD-10-CM | POA: Diagnosis present

## 2015-03-28 DIAGNOSIS — G4733 Obstructive sleep apnea (adult) (pediatric): Secondary | ICD-10-CM | POA: Diagnosis present

## 2015-03-28 DIAGNOSIS — Z8551 Personal history of malignant neoplasm of bladder: Secondary | ICD-10-CM | POA: Diagnosis not present

## 2015-03-28 DIAGNOSIS — E0836 Diabetes mellitus due to underlying condition with diabetic cataract: Secondary | ICD-10-CM

## 2015-03-28 DIAGNOSIS — D631 Anemia in chronic kidney disease: Secondary | ICD-10-CM | POA: Diagnosis present

## 2015-03-28 DIAGNOSIS — G2581 Restless legs syndrome: Secondary | ICD-10-CM | POA: Diagnosis present

## 2015-03-28 DIAGNOSIS — Z992 Dependence on renal dialysis: Secondary | ICD-10-CM | POA: Insufficient documentation

## 2015-03-28 DIAGNOSIS — R4182 Altered mental status, unspecified: Secondary | ICD-10-CM | POA: Diagnosis present

## 2015-03-28 DIAGNOSIS — Z79899 Other long term (current) drug therapy: Secondary | ICD-10-CM | POA: Insufficient documentation

## 2015-03-28 DIAGNOSIS — R4701 Aphasia: Secondary | ICD-10-CM | POA: Diagnosis present

## 2015-03-28 DIAGNOSIS — E1122 Type 2 diabetes mellitus with diabetic chronic kidney disease: Secondary | ICD-10-CM | POA: Diagnosis present

## 2015-03-28 DIAGNOSIS — E1139 Type 2 diabetes mellitus with other diabetic ophthalmic complication: Secondary | ICD-10-CM | POA: Diagnosis present

## 2015-03-28 DIAGNOSIS — G255 Other chorea: Secondary | ICD-10-CM | POA: Diagnosis not present

## 2015-03-28 DIAGNOSIS — N2581 Secondary hyperparathyroidism of renal origin: Secondary | ICD-10-CM | POA: Diagnosis present

## 2015-03-28 DIAGNOSIS — R410 Disorientation, unspecified: Secondary | ICD-10-CM | POA: Diagnosis not present

## 2015-03-28 DIAGNOSIS — E785 Hyperlipidemia, unspecified: Secondary | ICD-10-CM | POA: Diagnosis present

## 2015-03-28 DIAGNOSIS — R52 Pain, unspecified: Secondary | ICD-10-CM

## 2015-03-28 DIAGNOSIS — I639 Cerebral infarction, unspecified: Secondary | ICD-10-CM | POA: Diagnosis present

## 2015-03-28 DIAGNOSIS — I6381 Other cerebral infarction due to occlusion or stenosis of small artery: Secondary | ICD-10-CM | POA: Diagnosis present

## 2015-03-28 LAB — CBC WITH DIFFERENTIAL/PLATELET
Basophils Absolute: 0 10*3/uL (ref 0–0.1)
Basophils Relative: 0 %
Eosinophils Absolute: 0.2 10*3/uL (ref 0–0.7)
Eosinophils Relative: 2 %
HCT: 36.6 % — ABNORMAL LOW (ref 40.0–52.0)
HEMOGLOBIN: 12.5 g/dL — AB (ref 13.0–18.0)
LYMPHS ABS: 1.4 10*3/uL (ref 1.0–3.6)
LYMPHS PCT: 15 %
MCH: 30.9 pg (ref 26.0–34.0)
MCHC: 34.2 g/dL (ref 32.0–36.0)
MCV: 90.4 fL (ref 80.0–100.0)
MONOS PCT: 7 %
Monocytes Absolute: 0.7 10*3/uL (ref 0.2–1.0)
NEUTROS PCT: 76 %
Neutro Abs: 7.4 10*3/uL — ABNORMAL HIGH (ref 1.4–6.5)
Platelets: 247 10*3/uL (ref 150–440)
RBC: 4.05 MIL/uL — AB (ref 4.40–5.90)
RDW: 14.4 % (ref 11.5–14.5)
WBC: 9.7 10*3/uL (ref 3.8–10.6)

## 2015-03-28 LAB — COMPREHENSIVE METABOLIC PANEL
ALBUMIN: 3.9 g/dL (ref 3.5–5.0)
ALK PHOS: 103 U/L (ref 38–126)
ALT: 30 U/L (ref 17–63)
ANION GAP: 8 (ref 5–15)
AST: 26 U/L (ref 15–41)
BUN: 13 mg/dL (ref 6–20)
CALCIUM: 9.2 mg/dL (ref 8.9–10.3)
CHLORIDE: 102 mmol/L (ref 101–111)
CO2: 25 mmol/L (ref 22–32)
CREATININE: 2.67 mg/dL — AB (ref 0.61–1.24)
GFR calc non Af Amer: 21 mL/min — ABNORMAL LOW (ref >60)
GFR, EST AFRICAN AMERICAN: 25 mL/min — AB (ref >60)
GLUCOSE: 265 mg/dL — AB (ref 65–99)
Potassium: 3.6 mmol/L (ref 3.5–5.1)
SODIUM: 135 mmol/L (ref 135–145)
Total Bilirubin: 0.7 mg/dL (ref 0.3–1.2)
Total Protein: 6.6 g/dL (ref 6.5–8.1)

## 2015-03-28 LAB — LIPASE, BLOOD: Lipase: 18 U/L (ref 11–51)

## 2015-03-28 MED ORDER — HYDRALAZINE HCL 20 MG/ML IJ SOLN
10.0000 mg | INTRAMUSCULAR | Status: DC | PRN
  Administered 2015-03-28: 10 mg via INTRAVENOUS
  Filled 2015-03-28: qty 1

## 2015-03-28 NOTE — ED Notes (Signed)
Bed alarm placed on pt.

## 2015-03-28 NOTE — H&P (Addendum)
[place] [facility] [place] NAME: Jason Aguilar    MR#:  [id number]  DATE OF BIRTH:  09-09-1935  DATE OF ADMISSION:  03/28/2015  PRIMARY CARE PHYSICIAN: Valera Castle, MD   REQUESTING/REFERRING PHYSICIAN: Mariea Clonts, M.D.  CHIEF COMPLAINT:   Chief Complaint  Patient presents with  . Altered Mental Status    HISTORY OF PRESENT ILLNESS:  Jason Aguilar  is a 80 y.o. male who presents with intermittent alteration of mental status. Patient was in the ED earlier today with a complaint of intermittent confusion. He is accompanied by his wife at that time. His initial workup in the ED proved to be largely within normal limits. 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 However, as his wife was taken back home he became confused again and tried to open the car door and jump out per her report. She brought him back to the ED for reevaluation. Hospitalists were called for admission for evaluation of potential medical cause for his altered mental state.  PAST MEDICAL HISTORY:   Past Medical History  Diagnosis Date  . Glaucoma     both eyes Drs. Arrie Eastern and [place]  . Hematuria syndrome     s/p negative cystoscopy  . GERD (gastroesophageal reflux disease) 2003    EGD  . Allergy   . Hyperlipidemia   . Obstructive sleep apnea of adult     CPAP at 8 cm H20  . Noncompliance with medication regimen   . 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 by [facility]: low grade noninvasive  . Chronic kidney disease (CKD), stage III (moderate)     with proteinuria  . Glaucoma   . Hypertension   . Diabetes mellitus     Type 2  . Cataract due to secondary diabetes mellitus ([place])   . COPD (chronic obstructive pulmonary disease) ([place])     secondary to tobacco abuse  . History of tobacco abuse     60 pack year history; quit  1980    PAST SURGICAL HISTORY:   Past Surgical History  Procedure Laterality Date  . Elbow surgery    . Transurethral resection of bladder tumor  Mar 13 2011    [facility]  . Peripheral vascular catheterization N/A 01/03/2015    Procedure: Dialysis/Perma Catheter Insertion;  Surgeon: Katha Cabal, MD;  Location: [place] [facility];  Service: Cardiovascular;  Laterality: N/A;  . Skin graft    . Cataract extraction      SOCIAL HISTORY:   Social History  Substance Use Topics  . Smoking status: Former Smoker    Quit date: 10/18/1980  . Smokeless tobacco: Never Used  . Alcohol Use: 1.8 oz/week    3 Cans of beer per week    FAMILY HISTORY:   Family History  Problem Relation Age of Onset  . Diabetes Mother   . Heart disease Father     DRUG ALLERGIES:   Allergies  Allergen Reactions  . Losartan Swelling  . Dristan Rash    MEDICATIONS AT HOME:   Prior to Admission medications   Medication Sig Start Date End Date Taking? Authorizing Provider  ALPRAZolam Duanne Moron) 0.25 MG tablet Take 0.25 mg by mouth at bedtime as needed for sleep.   Yes Historical Provider, MD  amLODipine (NORVASC) 5 MG tablet Take 5 mg by mouth daily.   Yes  Historical Provider, MD  calcium acetate (PHOSLO) 667 MG capsule Take 2 capsules (1,334 mg total) by mouth 3 (three) times daily with meals. 01/06/15  Yes Gladstone Lighter, MD  cloNIDine (CATAPRES) 0.1 MG tablet Take 1 tablet (0.1 mg total) by mouth 2 (two) times daily. 01/06/15  Yes Gladstone Lighter, MD  doxazosin (CARDURA) 8 MG tablet Take 8 mg by mouth at bedtime.    Yes Historical Provider, MD  furosemide (LASIX) 40 MG tablet Take 1 tablet (40 mg total) by mouth daily. 01/06/15  Yes Gladstone Lighter, MD  hydrALAZINE (APRESOLINE) 100 MG tablet Take 100 mg by mouth 3 (three) times daily.   Yes Historical Provider, MD  insulin detemir (LEVEMIR) 100 UNIT/ML injection Inject 38 Units into the skin at bedtime.   Yes Historical Provider, MD  insulin  regular (NOVOLIN R) 100 units/mL injection Inject 5 Units into the skin 3 (three) times daily before meals.   Yes Historical Provider, MD  metoprolol (LOPRESSOR) 50 MG tablet Take 50 mg by mouth 2 (two) times daily.     Yes Historical Provider, MD  simvastatin (ZOCOR) 40 MG tablet Take 40 mg by mouth at bedtime.   Yes Historical Provider, MD  sodium bicarbonate 650 MG tablet Take 650 mg by mouth 2 (two) times daily.   Yes Historical Provider, MD    REVIEW OF SYSTEMS:  Review of Systems  Unable to perform ROS: medical condition     VITAL SIGNS:   Filed Vitals:   03/28/15 2008 03/28/15 2010 03/28/15 2100  BP:  186/106   Pulse:  75 78  Temp:  97.6 F (36.4 C)   TempSrc: Oral Oral   Resp: 16 16   SpO2:  97% 92%   Wt Readings from Last 3 Encounters:  03/28/15 68.04 kg (150 lb)  03/03/15 64.864 kg (143 lb)  01/06/15 76.885 kg (169 lb 8 oz)    PHYSICAL EXAMINATION:  Physical Exam  Vitals reviewed. Constitutional: He appears well-developed and well-nourished. No distress.  HENT:  Head: Normocephalic and atraumatic.  Mouth/Throat: Oropharynx is clear and moist.  Eyes: Conjunctivae and EOM are normal. Pupils are equal, round, and reactive to light. No scleral icterus.  Neck: Normal range of motion. Neck supple. No JVD present. No thyromegaly present.  Cardiovascular: Normal rate, regular rhythm and intact distal pulses.  Exam reveals no gallop and no friction rub.   No murmur heard. Respiratory: Effort normal and breath sounds normal. No respiratory distress. He has no wheezes. He has no rales.  GI: Soft. Bowel sounds are normal. He exhibits no distension. There is no tenderness.  Musculoskeletal: Normal range of motion. He exhibits no edema.  No arthritis, no gout  Lymphadenopathy:    He has no cervical adenopathy.  Neurological: He is alert. No cranial nerve deficit.  Patient is oriented to person and place, but not time or circumstance. He is otherwise neurologically intact.   Skin: Skin is warm and dry. No rash noted. No erythema.  Psychiatric:  Unable to fully assess due to the patient's confusion.    LABORATORY PANEL:   CBC  Recent Labs Lab 03/28/15 1446  WBC 9.7  HGB 12.5*  HCT 36.6*  PLT 247   ------------------------------------------------------------------------------------------------------------------  Chemistries   Recent Labs Lab 03/28/15 1446  NA 135  K 3.6  CL 102  CO2 25  GLUCOSE 265*  BUN 13  CREATININE 2.67*  CALCIUM 9.2  AST 26  ALT 30  ALKPHOS 103  BILITOT 0.7   ------------------------------------------------------------------------------------------------------------------  Cardiac Enzymes No results for input(s): TROPONINI in the last 168 hours. ------------------------------------------------------------------------------------------------------------------  RADIOLOGY:  Ct Head Wo Contrast  03/28/2015  CLINICAL DATA:  80 year old male with history of altered mental status and slurred speech. EXAM: CT HEAD WITHOUT CONTRAST TECHNIQUE: Contiguous axial images were obtained from the base of the skull through the vertex without intravenous contrast. COMPARISON:  No priors. FINDINGS: Mild cerebral and cerebellar atrophy. Physiologic calcifications in the basal ganglia bilaterally. Patchy and confluent areas of decreased attenuation are noted throughout the deep and periventricular white matter of the cerebral hemispheres bilaterally, compatible with chronic microvascular ischemic disease. No acute intracranial abnormalities. Specifically, no evidence of acute intracranial hemorrhage, no definite findings of acute/subacute cerebral ischemia, no mass, mass effect, hydrocephalus or abnormal intra or extra-axial fluid collections. Visualized paranasal sinuses and mastoids are well pneumatized. No acute displaced skull fractures are identified. IMPRESSION: 1. No acute intracranial abnormalities. 2. Mild cerebral and cerebellar  atrophy with extensive chronic microvascular ischemic changes throughout cerebral white matter, as above. Electronically Signed   By: Vinnie Langton M.D.   On: 03/28/2015 14:29    EKG:   Orders placed or performed during the hospital encounter of 03/28/15  . EKG 12-Lead  . EKG 12-Lead    IMPRESSION AND PLAN:  Principal Problem:   Altered mental state - unclear etiology at this time. His symptoms seem to have been somewhat rapidly declining over the last week or 2 per his wife's report. We will admit him on telemetry, and check his thyroid function, RPR and HIV, and get a psychiatry consult. Active Problems:   Hypertension - currently elevated, continue home meds for this as well as IV when necessary antihypertensives to maintain his blood pressure less than 160/100.   Diabetes mellitus type II, uncontrolled ([place]) - sliding scale insulin with course: Glucose checks and carb modified diet   COPD (chronic obstructive pulmonary disease) (HCC) - not on home inhalers, will monitor and treat as needed   ESRD on dialysis [facility] - nephrology consult for hemodialysis support.  All the records are reviewed and case discussed with ED provider. Management plans discussed with the patient and/or family.  DVT PROPHYLAXIS: Subcutaneous heparin  GI PROPHYLAXIS: None  ADMISSION STATUS: Inpatient  CODE STATUS: Full, based on prior documentation, however patient not appropriately oriented to have this discussion tonight and family was unable to be reached. This will need to be clarified once the patient's mental state improves or the family can be reached. Code Status History    Date Active Date Inactive Code Status Order ID Comments User Context   01/02/2015  9:42 PM 01/06/2015  6:51 PM Full Code [id number]  Lytle Butte, MD ED      TOTAL TIME TAKING CARE OF THIS PATIENT: 45 minutes.    Ericson Nafziger [place] 03/28/2015, 10:00 PM  [facility]  Office  [phone]  CC: Primary  care physician; Valera Castle, MD

## 2015-03-28 NOTE — ED Notes (Signed)
Pt sitting on edge of bed.  Pt updated waiting on lab results.  Pt states "I was fixing to leave", also asking for water.  Pt asked to sit back in bed while waiting for patient's safety and others walking through hall.  Pt refusing to sit back in bed per multiple requests.

## 2015-03-28 NOTE — ED Provider Notes (Signed)
[facility] Emergency Department Provider Note  ____________________________________________  Time seen: 1:35 PM on arrival by EMS  I have reviewed the triage vital signs and the nursing notes.   HISTORY  Chief Complaint Aphasia    HPI Jason Aguilar is a 80 y.o. male sent to the ED via EMS due to altered mental status or altered behavior. Patient was taken to his routine dialysis today by family, and when they were driving him home he seemed to be more agitated than usual. He is typically agitated and has bizarre behavior. They just felt like it was similar but increased subjectively. They pulled over to the side of the road and called EMS to pick the patient up and bring him to the emergency department. They felt like his speech was slurred at that time. EMS report that when they arrived the patient seemed to be at his usual baseline that the family agreed on, without any slurred speech or focal weakness. Patient denies any symptoms at all right now.     Past Medical History  Diagnosis Date  . Glaucoma     both eyes Drs. Arrie Eastern and [place]  . Hematuria syndrome     s/p negative cystoscopy  . GERD (gastroesophageal reflux disease) 2003    EGD  . Allergy   . Hyperlipidemia   . Obstructive sleep apnea of adult     CPAP at 8 cm H20  . Noncompliance with medication regimen   . 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 by [facility]: low grade noninvasive  . Chronic kidney disease (CKD), stage III (moderate)     with proteinuria  . Glaucoma   . Hypertension   . Diabetes mellitus     Type 2  . Cataract due to secondary diabetes mellitus ([place])   . COPD (chronic obstructive pulmonary disease) ([place])     secondary to tobacco abuse  . History of tobacco abuse     60 pack year history; quit 1980     Patient Active Problem List   Diagnosis Date Noted  . Acute diastolic CHF (congestive heart failure) ([place]) 01/06/2015  . ESRD on dialysis ([place])  01/06/2015  . Hypertensive urgency 01/02/2015  . Chest pain, localized 09/02/2011  . Adrenal nodule ([place]) 07/04/2011  . Chronic kidney disease (CKD), stage III (moderate)   . Cataract due to secondary diabetes mellitus ([place])   . Glaucoma   . COPD (chronic obstructive pulmonary disease) ([place])   . History of tobacco abuse   . Gout attack 04/12/2011  . Back pain, lumbosacral 04/07/2011  . Diabetes mellitus type II, uncontrolled ([place]) 04/07/2011  . transitional cell, bladder   . Nephropathy due to secondary diabetes mellitus ([place]) 10/21/2010  . Hypertension   . Hyperlipidemia   . Obstructive sleep apnea of adult   . Noncompliance with medication regimen      Past Surgical History  Procedure Laterality Date  . Elbow surgery    . Transurethral resection of bladder tumor  Mar 13 2011    [facility]  . Peripheral vascular catheterization N/A 01/03/2015    Procedure: Dialysis/Perma Catheter Insertion;  Surgeon: Katha Cabal, MD;  Location: [place] [facility];  Service: Cardiovascular;  Laterality: N/A;  . Skin graft    . Cataract extraction       Current Outpatient Rx  Name  Route  Sig  Dispense  Refill  . ALPRAZolam (XANAX) 0.25 MG tablet   Oral   Take 1 tablet (0.25  mg total) by mouth 3 (three) times daily as needed for anxiety.   20 tablet   0   . amLODipine (NORVASC) 10 MG tablet   Oral   Take 1 tablet (10 mg total) by mouth daily.   30 tablet   2   . atorvastatin (LIPITOR) 20 MG tablet   Oral   Take 1 tablet (20 mg total) by mouth daily at 6 PM.   30 tablet   2   . calcium acetate (PHOSLO) 667 MG capsule   Oral   Take 2 capsules (1,334 mg total) by mouth 3 (three) times daily with meals.   180 capsule   2   . cloNIDine (CATAPRES) 0.1 MG tablet   Oral   Take 1 tablet (0.1 mg total) by mouth 2 (two) times daily.   60 tablet   2   . doxazosin (CARDURA) 8 MG tablet   Oral   Take 8 mg by mouth daily.         . furosemide (LASIX) 40 MG tablet   Oral    Take 1 tablet (40 mg total) by mouth daily. Patient taking differently: Take 20 mg by mouth daily.    30 tablet   2   . hydrALAZINE (APRESOLINE) 100 MG tablet   Oral   Take 100 mg by mouth 3 (three) times daily.         . insulin detemir (LEVEMIR) 100 UNIT/ML injection   Subcutaneous   Inject 0.1 mLs (10 Units total) into the skin at bedtime.   10 mL   2   . metoprolol (LOPRESSOR) 50 MG tablet   Oral   Take 50 mg by mouth 2 (two) times daily.           . sodium bicarbonate 650 MG tablet   Oral   Take 650 mg by mouth 2 (two) times daily.            Allergies Losartan and Dristan   Family History  Problem Relation Age of Onset  . Diabetes Mother   . Heart disease Father     Social History Social History  Substance Use Topics  . Smoking status: Former Smoker    Quit date: 10/18/1980  . Smokeless tobacco: Never Used  . Alcohol Use: 1.8 oz/week    3 Cans of beer per week    Review of Systems  Constitutional:   No fever or chills. No weight changes Eyes:   No blurry vision or double vision.  ENT:   No sore throat.  Cardiovascular:   No chest pain. Respiratory:   No dyspnea or cough. Gastrointestinal:   Negative for abdominal pain, vomiting and diarrhea.  No BRBPR or melena. Genitourinary:   Negative for dysuria or difficulty urinating. Musculoskeletal:   Negative for back pain. No joint swelling or pain. Skin:   Negative for rash. Neurological:   Negative for headaches, focal weakness or numbness. Slurred speech per family, resolved Psychiatric:  No anxiety or depression.   Endocrine:  No changes in energy or sleep difficulty.  10-point ROS otherwise negative.  ____________________________________________   PHYSICAL EXAM:  VITAL SIGNS: ED Triage Vitals  Enc Vitals Group     BP 03/28/15 1346 181/74 mmHg     Pulse Rate 03/28/15 1346 65     Resp 03/28/15 1346 18     Temp 03/28/15 1346 97.9 F (36.6 C)     Temp Source 03/28/15 1346 Oral      SpO2 03/28/15 1346  96 %     Weight 03/28/15 1346 150 lb (68.04 kg)     Height 03/28/15 1346 5\' 4"  (1.626 m)     Head Cir --      Peak Flow --      Pain Score --      Pain Loc --      Pain Edu? --      Excl. in [place]? --     Vital signs reviewed, nursing assessments reviewed.   Constitutional:   Alert and oriented. Well appearing and in no distress. Eyes:   No scleral icterus. No conjunctival pallor. PERRL. EOMI ENT   Head:   Normocephalic and atraumatic.   Nose:   No congestion/rhinnorhea. No septal hematoma   Mouth/Throat:   MMM, no pharyngeal erythema. No peritonsillar mass.    Neck:   No stridor. No SubQ emphysema. No meningismus. Hematological/Lymphatic/Immunilogical:   No cervical lymphadenopathy. Cardiovascular:   RRR. Symmetric bilateral radial and DP pulses.  No murmurs. Dialysis catheter tunneled right IJ in the right upper chest. No evidence of inflammation or infection around the catheter site. Respiratory:   Normal respiratory effort without tachypnea nor retractions. Breath sounds are clear and equal bilaterally. No wheezes/rales/rhonchi. Gastrointestinal:   Soft and nontender. Non distended. There is no CVA tenderness.  No rebound, rigidity, or guarding. Genitourinary:   deferred Musculoskeletal:   Nontender with normal range of motion in all extremities. No joint effusions.  No lower extremity tenderness.  No edema. Neurologic:   Normal speech and language.  CN 2-10 normal. Motor grossly intact. Normal coordination. Ambulatory with steady gait No gross focal neurologic deficits are appreciated.  Skin:    Skin is warm, dry and intact. No rash noted.  No petechiae, purpura, or bullae. Psychiatric:   Mood and affect are normal. ____________________________________________    LABS (pertinent positives/negatives) (all labs ordered are listed, but only abnormal results are displayed) Labs Reviewed  COMPREHENSIVE METABOLIC PANEL - Abnormal; Notable for the  following:    Glucose, Bld 265 (*)    Creatinine, Ser 2.67 (*)    GFR calc non Af Amer 21 (*)    GFR calc Af Amer 25 (*)    All other components within normal limits  CBC WITH DIFFERENTIAL/PLATELET - Abnormal; Notable for the following:    RBC 4.05 (*)    Hemoglobin 12.5 (*)    HCT 36.6 (*)    Neutro Abs 7.4 (*)    All other components within normal limits  LIPASE, BLOOD   ____________________________________________   EKG  Interpreted by me  Date: 03/28/2015  Rate: 64  Rhythm: normal sinus rhythm  QRS Axis: normal  Intervals: normal  ST/T Wave abnormalities: normal  Conduction Disutrbances: none  Narrative Interpretation: unremarkable      ____________________________________________    RADIOLOGY  CT head unremarkable  ____________________________________________   PROCEDURES   ____________________________________________   INITIAL IMPRESSION / ASSESSMENT AND PLAN / ED COURSE  Pertinent labs & imaging results that were available during my care of the patient were reviewed by me and considered in my medical decision making (see chart for details).  Patient appears calm and comfortable, cooperative and lucid. Does not have any acute complaints at this time. Per the family's report there was a brief episode of slurred speech. I low suspicion for stroke or intracranial hemorrhage or meningitis or encephalitis, sepsis or severe dehydration, but with the recent dialysis including likely heparinization and his hypertension, we'll check a CT head and labs. If these  are unremarkable given that the patient is rapidly returned to his baseline, think that this is attributable to the physiologic stress from having large volume dialysis over short period of time as the patient did today.   ----------------------------------------- 4:49 PM on 03/28/2015 -----------------------------------------  Patient remains calm and comfortable well appearing and at baseline. Workup  is negative. We'll discharge home to follow up with primary care and neurology.    ____________________________________________   FINAL CLINICAL IMPRESSION(S) / ED DIAGNOSES  Final diagnoses:  Dialysis disequilibrium syndrome      Carrie Mew, MD 03/28/15 1650

## 2015-03-28 NOTE — ED Notes (Signed)
MD at bedside. 
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
## 2015-03-28 NOTE — ED Notes (Signed)
This nurse spoke with Jason Aguilar, pt's wife, on phone to pick patient up for discharge.  Wife on the way, patient waiting in lobby.  [name]'s number: [phone]
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
## 2015-03-29 ENCOUNTER — Observation Stay: Payer: Medicare Other

## 2015-03-29 DIAGNOSIS — R410 Disorientation, unspecified: Secondary | ICD-10-CM

## 2015-03-29 LAB — BASIC METABOLIC PANEL
Anion gap: 10 (ref 5–15)
BUN: 23 mg/dL — AB (ref 6–20)
CHLORIDE: 103 mmol/L (ref 101–111)
CO2: 22 mmol/L (ref 22–32)
CREATININE: 3.63 mg/dL — AB (ref 0.61–1.24)
Calcium: 9.5 mg/dL (ref 8.9–10.3)
GFR calc Af Amer: 17 mL/min — ABNORMAL LOW (ref >60)
GFR calc non Af Amer: 15 mL/min — ABNORMAL LOW (ref >60)
GLUCOSE: 232 mg/dL — AB (ref 65–99)
Potassium: 4.3 mmol/L (ref 3.5–5.1)
SODIUM: 135 mmol/L (ref 135–145)

## 2015-03-29 LAB — HEMOGLOBIN A1C: HEMOGLOBIN A1C: 11.2 % — AB (ref 4.0–6.0)

## 2015-03-29 LAB — URINALYSIS COMPLETE WITH MICROSCOPIC (ARMC ONLY)
BILIRUBIN URINE: NEGATIVE
Bacteria, UA: NONE SEEN
Hgb urine dipstick: NEGATIVE
KETONES UR: NEGATIVE mg/dL
Leukocytes, UA: NEGATIVE
NITRITE: NEGATIVE
Protein, ur: >500 mg/dL — AB
RBC / HPF: NONE SEEN RBC/hpf (ref 0–5)
Specific Gravity, Urine: 1.009 (ref 1.005–1.030)
pH: 6 (ref 5.0–8.0)

## 2015-03-29 LAB — RAPID HIV SCREEN (HIV 1/2 AB+AG)
HIV 1/2 Antibodies: NONREACTIVE
HIV-1 P24 Antigen - HIV24: NONREACTIVE

## 2015-03-29 LAB — TSH: TSH: 1.169 u[IU]/mL (ref 0.350–4.500)

## 2015-03-29 LAB — CBC
HEMATOCRIT: 35.1 % — AB (ref 40.0–52.0)
Hemoglobin: 12.1 g/dL — ABNORMAL LOW (ref 13.0–18.0)
MCH: 30.9 pg (ref 26.0–34.0)
MCHC: 34.3 g/dL (ref 32.0–36.0)
MCV: 90.1 fL (ref 80.0–100.0)
PLATELETS: 245 10*3/uL (ref 150–440)
RBC: 3.9 MIL/uL — AB (ref 4.40–5.90)
RDW: 14.6 % — ABNORMAL HIGH (ref 11.5–14.5)
WBC: 9.5 10*3/uL (ref 3.8–10.6)

## 2015-03-29 LAB — GLUCOSE, CAPILLARY
GLUCOSE-CAPILLARY: 211 mg/dL — AB (ref 65–99)
GLUCOSE-CAPILLARY: 235 mg/dL — AB (ref 65–99)
Glucose-Capillary: 211 mg/dL — ABNORMAL HIGH (ref 65–99)
Glucose-Capillary: 216 mg/dL — ABNORMAL HIGH (ref 65–99)

## 2015-03-29 LAB — MAGNESIUM: MAGNESIUM: 1.6 mg/dL — AB (ref 1.7–2.4)

## 2015-03-29 LAB — CREATININE, SERUM
Creatinine, Ser: 3.64 mg/dL — ABNORMAL HIGH (ref 0.61–1.24)
GFR, EST AFRICAN AMERICAN: 17 mL/min — AB (ref >60)
GFR, EST NON AFRICAN AMERICAN: 15 mL/min — AB (ref >60)

## 2015-03-29 LAB — PHOSPHORUS: Phosphorus: 4.5 mg/dL (ref 2.5–4.6)

## 2015-03-29 MED ORDER — SODIUM BICARBONATE 650 MG PO TABS
650.0000 mg | ORAL_TABLET | Freq: Two times a day (BID) | ORAL | Status: DC
  Administered 2015-03-29 – 2015-04-05 (×14): 650 mg via ORAL
  Filled 2015-03-29 (×14): qty 1

## 2015-03-29 MED ORDER — ATORVASTATIN CALCIUM 20 MG PO TABS
20.0000 mg | ORAL_TABLET | Freq: Every day | ORAL | Status: DC
  Administered 2015-03-29 – 2015-04-04 (×6): 20 mg via ORAL
  Filled 2015-03-29 (×5): qty 1

## 2015-03-29 MED ORDER — ONDANSETRON HCL 4 MG PO TABS: 4.0000 mg | ORAL_TABLET | Freq: Four times a day (QID) | ORAL | Status: DC | PRN

## 2015-03-29 MED ORDER — FUROSEMIDE 40 MG PO TABS
40.0000 mg | ORAL_TABLET | Freq: Every day | ORAL | Status: DC
  Administered 2015-03-29 – 2015-04-05 (×6): 40 mg via ORAL
  Filled 2015-03-29 (×6): qty 1

## 2015-03-29 MED ORDER — SODIUM CHLORIDE 0.9% FLUSH
3.0000 mL | Freq: Two times a day (BID) | INTRAVENOUS | Status: DC
  Administered 2015-03-29 – 2015-04-02 (×9): 3 mL via INTRAVENOUS

## 2015-03-29 MED ORDER — SIMVASTATIN 40 MG PO TABS
40.0000 mg | ORAL_TABLET | Freq: Every day | ORAL | Status: DC
  Administered 2015-03-29: 40 mg via ORAL
  Filled 2015-03-29: qty 1

## 2015-03-29 MED ORDER — ONDANSETRON HCL 4 MG/2ML IJ SOLN: 4.0000 mg | Freq: Four times a day (QID) | INTRAMUSCULAR | Status: DC | PRN

## 2015-03-29 MED ORDER — ALPRAZOLAM 0.25 MG PO TABS
0.2500 mg | ORAL_TABLET | Freq: Every evening | ORAL | Status: DC | PRN
  Administered 2015-03-29: 0.25 mg via ORAL
  Filled 2015-03-29: qty 1

## 2015-03-29 MED ORDER — HEPARIN SODIUM (PORCINE) 5000 UNIT/ML IJ SOLN
5000.0000 [IU] | Freq: Three times a day (TID) | INTRAMUSCULAR | Status: DC
  Administered 2015-03-29 – 2015-04-05 (×21): 5000 [IU] via SUBCUTANEOUS
  Filled 2015-03-29 (×21): qty 1

## 2015-03-29 MED ORDER — CLONIDINE HCL 0.1 MG PO TABS
0.1000 mg | ORAL_TABLET | Freq: Two times a day (BID) | ORAL | Status: DC
  Administered 2015-03-29 – 2015-04-05 (×12): 0.1 mg via ORAL
  Filled 2015-03-29 (×12): qty 1

## 2015-03-29 MED ORDER — ACETAMINOPHEN 325 MG PO TABS
650.0000 mg | ORAL_TABLET | Freq: Four times a day (QID) | ORAL | Status: DC | PRN
  Administered 2015-03-30: 650 mg via ORAL
  Filled 2015-03-29: qty 2

## 2015-03-29 MED ORDER — INSULIN ASPART 100 UNIT/ML ~~LOC~~ SOLN
0.0000 [IU] | Freq: Three times a day (TID) | SUBCUTANEOUS | Status: DC
  Administered 2015-03-29 (×4): 3 [IU] via SUBCUTANEOUS
  Administered 2015-03-30: 5 [IU] via SUBCUTANEOUS
  Administered 2015-03-31: 3 [IU] via SUBCUTANEOUS
  Administered 2015-03-31: 7 [IU] via SUBCUTANEOUS
  Administered 2015-03-31: 3 [IU] via SUBCUTANEOUS
  Administered 2015-04-01: 1 [IU] via SUBCUTANEOUS
  Administered 2015-04-01: 2 [IU] via SUBCUTANEOUS
  Administered 2015-04-01 – 2015-04-02 (×2): 3 [IU] via SUBCUTANEOUS
  Administered 2015-04-02 (×2): 2 [IU] via SUBCUTANEOUS
  Administered 2015-04-03: 1 [IU] via SUBCUTANEOUS
  Administered 2015-04-03: 2 [IU] via SUBCUTANEOUS
  Administered 2015-04-04: 1 [IU] via SUBCUTANEOUS
  Administered 2015-04-04: 7 [IU] via SUBCUTANEOUS
  Administered 2015-04-05: 5 [IU] via SUBCUTANEOUS
  Filled 2015-03-29: qty 3
  Filled 2015-03-29: qty 2
  Filled 2015-03-29: qty 3
  Filled 2015-03-29: qty 2
  Filled 2015-03-29: qty 3
  Filled 2015-03-29: qty 5
  Filled 2015-03-29: qty 7
  Filled 2015-03-29: qty 3
  Filled 2015-03-29: qty 1
  Filled 2015-03-29 (×4): qty 3
  Filled 2015-03-29 (×2): qty 1
  Filled 2015-03-29: qty 5
  Filled 2015-03-29 (×2): qty 2

## 2015-03-29 MED ORDER — HYDRALAZINE HCL 50 MG PO TABS
100.0000 mg | ORAL_TABLET | Freq: Three times a day (TID) | ORAL | Status: DC
  Administered 2015-03-29 – 2015-04-05 (×15): 100 mg via ORAL
  Filled 2015-03-29 (×16): qty 2

## 2015-03-29 MED ORDER — METOPROLOL TARTRATE 50 MG PO TABS
50.0000 mg | ORAL_TABLET | Freq: Two times a day (BID) | ORAL | Status: DC
  Administered 2015-03-29 – 2015-04-05 (×11): 50 mg via ORAL
  Filled 2015-03-29 (×12): qty 1

## 2015-03-29 MED ORDER — ACETAMINOPHEN 650 MG RE SUPP: 650.0000 mg | Freq: Four times a day (QID) | RECTAL | Status: DC | PRN

## 2015-03-29 MED ORDER — DOXAZOSIN MESYLATE 4 MG PO TABS
8.0000 mg | ORAL_TABLET | Freq: Every day | ORAL | Status: DC
  Administered 2015-03-29 – 2015-04-04 (×7): 8 mg via ORAL
  Filled 2015-03-29 (×7): qty 2

## 2015-03-29 MED ORDER — AMLODIPINE BESYLATE 5 MG PO TABS
5.0000 mg | ORAL_TABLET | Freq: Every day | ORAL | Status: DC
  Administered 2015-03-29 – 2015-04-05 (×5): 5 mg via ORAL
  Filled 2015-03-29 (×6): qty 1

## 2015-03-29 NOTE — Consult Note (Signed)
[facility] Consult   Reason for Consult:  Consult for 80 year old man with end-stage renal failure on dialysis. Also diabetes COPD multiple medical problems. Consult regarding episodes of intermittent confusion Referring Physician:  Anselm Jungling Patient Identification: Jason Aguilar MRN:  [id number] Principal Diagnosis: Altered mental state Diagnosis:   Patient Active Problem List   Diagnosis Date Noted  . Acute delirium [R41.0] 03/29/2015  . Altered mental state [R41.82] 03/28/2015  . Acute diastolic CHF (congestive heart failure) ([place]) [I50.31] 01/06/2015  . ESRD on dialysis ([place]) [N18.6, Z99.2] 01/06/2015  . Hypertensive urgency [I16.0] 01/02/2015  . Chest pain, localized [R07.89] 09/02/2011  . Adrenal nodule ([place]) [E27.9] 07/04/2011  . Cataract due to secondary diabetes mellitus ([place]) [E13.36]   . Glaucoma [365]   . COPD (chronic obstructive pulmonary disease) ([place]) [J44.9]   . History of tobacco abuse [Z87.891]   . Gout attack [M10.9] 04/12/2011  . Back pain, lumbosacral [M54.5, M54.89] 04/07/2011  . Diabetes mellitus type II, uncontrolled ([place]) [E11.65] 04/07/2011  . transitional cell, bladder [C80.1]   . Nephropathy due to secondary diabetes mellitus ([place]) [E13.21] 10/21/2010  . Hypertension [I10]   . Hyperlipidemia [E78.5]   . Obstructive sleep apnea of adult [G47.33]   . Noncompliance with medication regimen [Z91.14]     Total Time spent with patient: 45 minutes  Subjective:   Jason Aguilar is a 80 y.o. male patient admitted with "I'm alright I guess".  HPI:  Patient interviewed. Chart reviewed labs reviewed. 80 year old man with diabetes and end-stage renal failure on dialysis admitted to the hospital with reports of intermittent episodes of confusion. The patient tells me that he is unaware of having any episodes of confusion. He does say that he's been aware for a while at home of having spells in which she is not able to walk and feels "drunk" at times.  He seems to indicate that this happened after his primary care doctor change one of his medicines but he is not able to be very specific about that. Patient is currently able to deny any depression and denies any hallucinations. Denies any suicidal or homicidal ideation. Does say that he is feeling tired right now.  Social history: Lives at home with his wife. Has adult children. Doesn't report major stresses other than his illness.  Substance abuse history: Says that he used to be a heavy drinker drinking up to a fifth of liquor a day but stopped 30 years ago. No alcohol anytime recently. No history of drug abuse.  Medical history: Patient has diabetes with renal failure that has progressed to the point of being on dialysis. He tells me that he's been on dialysis for about 3 months. Also has a history of COPD hyperlipidemia. Obstructive sleep apnea.  Past Psychiatric History: No known past psychiatric history. He denies ever seeing a psychiatrist therapist or mental health provider in the past. No history of suicide attempts. No history of psychiatric hospitalization. On no medicine for psychiatric illness.  Risk to Self: Is patient at risk for suicide?: No Risk to Others:   Prior Inpatient Therapy:   Prior Outpatient Therapy:    Past Medical History:  Past Medical History  Diagnosis Date  . Glaucoma     both eyes Drs. Arrie Eastern and [place]  . Hematuria syndrome     s/p negative cystoscopy  . GERD (gastroesophageal reflux disease) 2003    EGD  . Allergy   . Hyperlipidemia   . Obstructive sleep apnea of adult  CPAP at 8 cm H20  . Noncompliance with medication regimen   . 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 by [facility]: low grade noninvasive  . Chronic kidney disease (CKD), stage III (moderate)     with proteinuria  . Glaucoma   . Hypertension   . Diabetes mellitus     Type 2  . Cataract due to secondary diabetes mellitus ([place])   . COPD (chronic obstructive  pulmonary disease) ([place])     secondary to tobacco abuse  . History of tobacco abuse     60 pack year history; quit 1980    Past Surgical History  Procedure Laterality Date  . Elbow surgery    . Transurethral resection of bladder tumor  Mar 13 2011    [facility]  . Peripheral vascular catheterization N/A 01/03/2015    Procedure: Dialysis/Perma Catheter Insertion;  Surgeon: Katha Cabal, MD;  Location: [facility];  Service: Cardiovascular;  Laterality: N/A;  . Skin graft    . Cataract extraction     Family History:  Family History  Problem Relation Age of Onset  . Diabetes Mother   . Heart disease Father    Family Psychiatric  History: Patient denies knowing of any family history of mental health problems Social History:  History  Alcohol Use  . 1.8 oz/week  . 3 Cans of beer per week     History  Drug Use No    Social History   Social History  . Marital Status: Married    Spouse Name: N/A  . Number of Children: N/A  . Years of Education: N/A   Social History Main Topics  . Smoking status: Former Smoker    Quit date: 10/18/1980  . Smokeless tobacco: Never Used  . Alcohol Use: 1.8 oz/week    3 Cans of beer per week  . Drug Use: No  . Sexual Activity: Not Asked   Other Topics Concern  . None   Social History Narrative   Additional Social History:    Allergies:   Allergies  Allergen Reactions  . Losartan Swelling  . Dristan Rash    Labs:  Results for orders placed or performed during the hospital encounter of 03/28/15 (from the past 48 hour(s))  CBC     Status: Abnormal   Collection Time: 03/29/15  5:34 AM  Result Value Ref Range   WBC 9.5 3.8 - 10.6 K/uL   RBC 3.90 (L) 4.40 - 5.90 MIL/uL   Hemoglobin 12.1 (L) 13.0 - 18.0 g/dL   HCT 35.1 (L) 40.0 - 52.0 %   MCV 90.1 80.0 - 100.0 fL   MCH 30.9 26.0 - 34.0 pg   MCHC 34.3 32.0 - 36.0 g/dL   RDW 14.6 (H) 11.5 - 14.5 %   Platelets 245 150 - 440 K/uL  Creatinine, serum     Status: Abnormal    Collection Time: 03/29/15  5:34 AM  Result Value Ref Range   Creatinine, Ser 3.64 (H) 0.61 - 1.24 mg/dL   GFR calc non Af Amer 15 (L) >60 mL/min   GFR calc Af Amer 17 (L) >60 mL/min    Comment: (NOTE) The eGFR has been calculated using the CKD EPI equation. This calculation has not been validated in all clinical situations. eGFR's persistently <60 mL/min signify possible Chronic Kidney Disease.   Magnesium     Status: Abnormal   Collection Time: 03/29/15  5:34 AM  Result Value Ref Range   Magnesium 1.6 (  L) 1.7 - 2.4 mg/dL  Phosphorus     Status: None   Collection Time: 03/29/15  5:34 AM  Result Value Ref Range   Phosphorus 4.5 2.5 - 4.6 mg/dL  TSH     Status: None   Collection Time: 03/29/15  5:34 AM  Result Value Ref Range   TSH 1.169 0.350 - 4.500 uIU/mL  Basic metabolic panel     Status: Abnormal   Collection Time: 03/29/15  5:34 AM  Result Value Ref Range   Sodium 135 135 - 145 mmol/L   Potassium 4.3 3.5 - 5.1 mmol/L   Chloride 103 101 - 111 mmol/L   CO2 22 22 - 32 mmol/L   Glucose, Bld 232 (H) 65 - 99 mg/dL   BUN 23 (H) 6 - 20 mg/dL   Creatinine, Ser 3.63 (H) 0.61 - 1.24 mg/dL   Calcium 9.5 8.9 - 10.3 mg/dL   GFR calc non Af Amer 15 (L) >60 mL/min   GFR calc Af Amer 17 (L) >60 mL/min    Comment: (NOTE) The eGFR has been calculated using the CKD EPI equation. This calculation has not been validated in all clinical situations. eGFR's persistently <60 mL/min signify possible Chronic Kidney Disease.    Anion gap 10 5 - 15  Rapid HIV screen (HIV 1/2 Ab+Ag) ([facility] Only)     Status: None   Collection Time: 03/29/15  5:34 AM  Result Value Ref Range   HIV-1 P24 Antigen - HIV24 NON REACTIVE NON REACTIVE   HIV 1/2 Antibodies NON REACTIVE NON REACTIVE   Interpretation (HIV Ag Ab)      A non reactive test result means that HIV 1 or HIV 2 antibodies and HIV 1 p24 antigen were not detected in the specimen.  Glucose, capillary     Status: Abnormal   Collection Time: 03/29/15   7:33 AM  Result Value Ref Range   Glucose-Capillary 211 (H) 65 - 99 mg/dL  Glucose, capillary     Status: Abnormal   Collection Time: 03/29/15 11:45 AM  Result Value Ref Range   Glucose-Capillary 211 (H) 65 - 99 mg/dL    Current Facility-Administered Medications  Medication Dose Route Frequency Provider Last Rate Last Dose  . acetaminophen (TYLENOL) tablet 650 mg  650 mg Oral Q6H PRN Lance Coon, MD       Or  . acetaminophen (TYLENOL) suppository 650 mg  650 mg Rectal Q6H PRN Lance Coon, MD      . ALPRAZolam Duanne Moron) tablet 0.25 mg  0.25 mg Oral QHS PRN Lance Coon, MD   0.25 mg at 03/29/15 0223  . amLODipine (NORVASC) tablet 5 mg  5 mg Oral Daily Lance Coon, MD   5 mg at 03/29/15 1116  . atorvastatin (LIPITOR) tablet 20 mg  20 mg Oral q1800 Vaughan Basta, MD      . cloNIDine (CATAPRES) tablet 0.1 mg  0.1 mg Oral BID Lance Coon, MD   0.1 mg at 03/29/15 1116  . doxazosin (CARDURA) tablet 8 mg  8 mg Oral QHS Lance Coon, MD   8 mg at 03/29/15 0150  . furosemide (LASIX) tablet 40 mg  40 mg Oral Daily Lance Coon, MD   40 mg at 03/29/15 1116  . heparin injection 5,000 Units  5,000 Units Subcutaneous 3 times per day Lance Coon, MD   5,000 Units at 03/29/15 1442  . hydrALAZINE (APRESOLINE) injection 10 mg  10 mg Intravenous Q4H PRN Lance Coon, MD   10 mg at 03/28/15 2220  .  hydrALAZINE (APRESOLINE) tablet 100 mg  100 mg Oral TID Lance Coon, MD   100 mg at 03/29/15 1117  . insulin aspart (novoLOG) injection 0-9 Units  0-9 Units Subcutaneous TID AC & HS Lance Coon, MD   3 Units at 03/29/15 1249  . metoprolol (LOPRESSOR) tablet 50 mg  50 mg Oral BID Lance Coon, MD   50 mg at 03/29/15 1117  . ondansetron (ZOFRAN) tablet 4 mg  4 mg Oral Q6H PRN Lance Coon, MD       Or  . ondansetron [facility]) injection 4 mg  4 mg Intravenous Q6H PRN Lance Coon, MD      . sodium bicarbonate tablet 650 mg  650 mg Oral BID Lance Coon, MD   650 mg at 03/29/15 1117  . sodium chloride  flush (NS) 0.9 % injection 3 mL  3 mL Intravenous Q12H Lance Coon, MD   3 mL at 03/29/15 1120    Musculoskeletal: Strength & Muscle Tone: decreased Gait & Station: unsteady Patient leans: N/A  Psychiatric Specialty Exam: Review of Systems  Unable to perform ROS: mental acuity    Blood pressure 123/37, pulse 56, temperature 97.7 F (36.5 C), temperature source Oral, resp. rate 18, height _0  (1.651 m), weight 60.782 kg (134 lb), SpO2 100 %.Body mass index is 22.3 kg/(m^2).  General Appearance: Disheveled  Eye Sport and exercise psychologist::  Fair  Speech:  Garbled, Slow and Slurred  Volume:  Decreased  Mood:  Euthymic  Affect:  Constricted  Thought Process:  Goal Directed  Orientation:  Full (Time, Place, and Person)  Thought Content:  Negative  Suicidal Thoughts:  No  Homicidal Thoughts:  No  Memory:  Immediate;   Fair Recent;   Fair Remote;   Fair  Judgement:  Fair  Insight:  Fair  Psychomotor Activity:  Decreased  Concentration:  Fair  Recall:  [employer] of Knowledge:Fair  Language: Fair  Akathisia:  No  Handed:  Right  AIMS (if indicated):     Assets:  Housing Resilience Social Support  ADL's:  Impaired  Cognition: Impaired,  Mild  Sleep:      Treatment Plan Summary: Plan This is a 80 year old man with no past psychiatric history. Currently in the hospital in part for altered mental status. All the history I could get was from the patient and the chart. He has some intermittent spells in which she becomes very confused and not able to communicate and then other spells in which she is more lucid. Fortunately this afternoon I seem to of caught him in a lucid pe3riod. There is no evidence of mood disorder psychotic disorder or other psychiatric type illness other than delirium. The history is very consistent with medically induced delirium. I would assume that whatever is happening is a result of a medical symptom potentially changes in blood sugar oxygenation at pressure possible  intermittent infectious agents. Right now he is not agitated there doesn't appear to be in any reason to add antipsychotic medicine. I will follow-up as needed...  Disposition: Patient does not meet criteria for psychiatric inpatient admission.  Alethia Berthold, MD 03/29/2015 4:54 PM

## 2015-03-29 NOTE — Progress Notes (Signed)
[facility] [place] NAME: Jason Aguilar    MR#:  [id number]  DATE OF BIRTH:  10/01/1935  SUBJECTIVE:  CHIEF COMPLAINT:   Chief Complaint  Patient presents with  . Altered Mental Status     Still some confused about the events,a nd don't know why he is in hospital.  But denies any complains. Michela Pitcher he feels some weak and dizzi.  REVIEW OF SYSTEMS:  CONSTITUTIONAL: No fever, some fatigue or weakness.  EYES: No blurred or double vision.  EARS, NOSE, AND THROAT: No tinnitus or ear pain.  RESPIRATORY: No cough, shortness of breath, wheezing or hemoptysis.  CARDIOVASCULAR: No chest pain, orthopnea, edema.  GASTROINTESTINAL: No nausea, vomiting, diarrhea or abdominal pain.  GENITOURINARY: No dysuria, hematuria.  ENDOCRINE: No polyuria, nocturia,  HEMATOLOGY: No anemia, easy bruising or bleeding SKIN: No rash or lesion. MUSCULOSKELETAL: No joint pain or arthritis.   NEUROLOGIC: No tingling, numbness,positive for weakness.  PSYCHIATRY: No anxiety or depression.   ROS  DRUG ALLERGIES:   Allergies  Allergen Reactions  . Losartan Swelling  . Dristan Rash    VITALS:  Blood pressure 155/45, pulse 56, temperature 97.7 F (36.5 C), temperature source Oral, resp. rate 18, height 5\' 5"  (1.651 m), weight 60.782 kg (134 lb), SpO2 100 %.  PHYSICAL EXAMINATION:  GENERAL:  80 y.o.-year-old patient lying in the bed with no acute distress.  EYES: Pupils equal, round, reactive to light and accommodation. No scleral icterus. Extraocular muscles intact.  HEENT: Head atraumatic, normocephalic. Oropharynx and nasopharynx clear.  NECK:  Supple, no jugular venous distention. No thyroid enlargement, no tenderness.  LUNGS: Normal breath sounds bilaterally, no wheezing, rales,rhonchi or crepitation. No use of accessory muscles of respiration.  CARDIOVASCULAR: S1, S2 normal. No murmurs, rubs, or gallops.  ABDOMEN: Soft, nontender, nondistended. Bowel  sounds present. No organomegaly or mass.  EXTREMITIES: No pedal edema, cyanosis, or clubbing.  NEUROLOGIC: Cranial nerves II through XII are intact. Muscle strength 5/5 in all extremities. Sensation intact. Gait not checked. Able to perform finger nose test fine. PSYCHIATRIC: The patient is alert and oriented x 2.  SKIN: No obvious rash, lesion, or ulcer.   Physical Exam LABORATORY PANEL:   CBC  Recent Labs Lab 03/29/15 0534  WBC 9.5  HGB 12.1*  HCT 35.1*  PLT 245   ------------------------------------------------------------------------------------------------------------------  Chemistries   Recent Labs Lab 03/28/15 1446 03/29/15 0534  NA 135 135  K 3.6 4.3  CL 102 103  CO2 25 22  GLUCOSE 265* 232*  BUN 13 23*  CREATININE 2.67* 3.64*  3.63*  CALCIUM 9.2 9.5  MG  --  1.6*  AST 26  --   ALT 30  --   ALKPHOS 103  --   BILITOT 0.7  --    ------------------------------------------------------------------------------------------------------------------  Cardiac Enzymes No results for input(s): TROPONINI in the last 168 hours. ------------------------------------------------------------------------------------------------------------------  RADIOLOGY:  Ct Head Wo Contrast  03/28/2015  CLINICAL DATA:  80 year old male with history of altered mental status and slurred speech. EXAM: CT HEAD WITHOUT CONTRAST TECHNIQUE: Contiguous axial images were obtained from the base of the skull through the vertex without intravenous contrast. COMPARISON:  No priors. FINDINGS: Mild cerebral and cerebellar atrophy. Physiologic calcifications in the basal ganglia bilaterally. Patchy and confluent areas of decreased attenuation are noted throughout the deep and periventricular white matter of the cerebral hemispheres bilaterally, compatible with chronic microvascular ischemic disease. No acute intracranial abnormalities. Specifically, no evidence of acute intracranial hemorrhage, no  definite  findings of acute/subacute cerebral ischemia, no mass, mass effect, hydrocephalus or abnormal intra or extra-axial fluid collections. Visualized paranasal sinuses and mastoids are well pneumatized. No acute displaced skull fractures are identified. IMPRESSION: 1. No acute intracranial abnormalities. 2. Mild cerebral and cerebellar atrophy with extensive chronic microvascular ischemic changes throughout cerebral white matter, as above. Electronically Signed   By: Vinnie Langton M.D.   On: 03/28/2015 14:29    ASSESSMENT AND PLAN:   Principal Problem:   Altered mental state Active Problems:   Hypertension   Diabetes mellitus type II, uncontrolled (HCC)   COPD (chronic obstructive pulmonary disease) (HCC)   ESRD on dialysis (HCC)   Acute delirium  Altered mental state - unclear etiology at this time. His symptoms seem to have been somewhat rapidly declining over the last week or 2 per his wife's report. Negative thyroid function, RPR and HIV, and  psychiatry consult.   Checked UA and Xray chest- negative.   Also get MRI brain.   It may be his dementia?    I tried calling all 3 numbers available in his room- for home , wife and daughter- but nobody answered.    Hypertension - currently elevated, continue home meds for this as well as IV when necessary antihypertensives to maintain his blood pressure less than 160/100.   Diabetes mellitus type II, uncontrolled ([place]) - sliding scale insulin with course: Glucose checks and carb modified diet  COPD (chronic obstructive pulmonary disease) (HCC) - not on home inhalers, will monitor and treat as needed  ESRD on dialysis [facility]) - nephrology consult for hemodialysis support.   All the records are reviewed and case discussed with Care Management/Social Workerr. Management plans discussed with the patient, family and they are in agreement.  CODE STATUS: Full.  TOTAL TIME TAKING CARE OF THIS PATIENT: 35 minutes.   POSSIBLE D/C IN 1-2 DAYS,  DEPENDING ON CLINICAL CONDITION.   Vaughan Basta M.D on 03/29/2015   Between 7am to 6pm - Pager - [phone]  After 6pm go to [email or web address] - password [email or web address] [facility]  Office  [phone]  CC: Primary care physician; Valera Castle, MD  Note: This dictation was prepared with Dragon dictation along with smaller phrase technology. Any transcriptional errors that result from this process are unintentional.

## 2015-03-29 NOTE — Care Management Obs Status (Signed)
[place] NOTIFICATION   Patient Details  Name: Jason Aguilar MRN: [id number] Date of Birth: October 21, 1935   Medicare Observation Status Notification Given:  Yes Medicare obersvation Code 62 letter reviewed and signed with patient.  Confusion noted.  With patient permission contacted wife and daughter.  Unable to reach.  Message left.  Signed copy and short form sent to HIM   Beverly Sessions, RN 03/29/2015, 4:26 PM

## 2015-03-29 NOTE — Progress Notes (Signed)
Called Dr. Jannifer [facility] regarding medication for anxiety per patient request.  Doctor put in appropriate orders.  Jason Aguilar 03/29/2015  2:19 AM
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
## 2015-03-29 NOTE — Progress Notes (Signed)
Inpatient Diabetes Program Recommendations  AACE/ADA: New Consensus Statement on Inpatient Glycemic Control (2015)  Target Ranges:  Prepandial:   less than 140 mg/dL      Peak postprandial:   less than 180 mg/dL (1-2 hours)      Critically ill patients:  140 - 180 mg/dL   Review of Glycemic Control: Results for Jason Aguilar, Jason Aguilar (MRN [id number]) as of 03/29/2015 13:10  Ref. Range 03/29/2015 07:33 03/29/2015 11:45  Glucose-Capillary Latest Ref Range: 65-99 mg/dL 211 (H) 211 (H)    Diabetes history: Type 2 diabetes Outpatient Diabetes medications: Levemir 38 units q HS, Novolin R 5 units tid with meals Current orders for Inpatient glycemic control:  Novolog sensitive tid with meals and HS  Inpatient Diabetes Program Recommendations:    Note that patient was on Levemir and Novolin meal coverage prior to admit.  Please consider restarting Levemir 20 units daily and adding Novolog meal coverage 3 units tid with meals.  Thanks, Adah Perl, RN, BC-ADM Inpatient Diabetes Coordinator Pager [phone] (8a-5p)

## 2015-03-30 ENCOUNTER — Observation Stay: Payer: Medicare Other

## 2015-03-30 LAB — GLUCOSE, CAPILLARY
GLUCOSE-CAPILLARY: 247 mg/dL — AB (ref 65–99)
GLUCOSE-CAPILLARY: 285 mg/dL — AB (ref 65–99)
GLUCOSE-CAPILLARY: 56 mg/dL — AB (ref 65–99)
GLUCOSE-CAPILLARY: 70 mg/dL (ref 65–99)
GLUCOSE-CAPILLARY: 65 mg/dL (ref 65–99)
Glucose-Capillary: 115 mg/dL — ABNORMAL HIGH (ref 65–99)

## 2015-03-30 LAB — RPR: RPR Ser Ql: NONREACTIVE

## 2015-03-30 MED ORDER — CLOPIDOGREL BISULFATE 75 MG PO TABS
75.0000 mg | ORAL_TABLET | Freq: Every day | ORAL | Status: DC
Start: 2015-03-30 — End: 2015-04-05
  Administered 2015-03-30 – 2015-04-05 (×6): 75 mg via ORAL
  Filled 2015-03-30 (×6): qty 1

## 2015-03-30 MED ORDER — DEXTROSE 5 % IV SOLN
INTRAVENOUS | Status: DC
  Administered 2015-03-30 – 2015-03-31 (×2): via INTRAVENOUS

## 2015-03-30 MED ORDER — ALPRAZOLAM 0.25 MG PO TABS
0.2500 mg | ORAL_TABLET | Freq: Two times a day (BID) | ORAL | Status: DC | PRN
  Administered 2015-03-30 – 2015-04-03 (×3): 0.25 mg via ORAL
  Filled 2015-03-30 (×3): qty 1

## 2015-03-30 MED ORDER — INSULIN DETEMIR 100 UNIT/ML ~~LOC~~ SOLN
20.0000 [IU] | Freq: Every day | SUBCUTANEOUS | Status: DC
  Administered 2015-03-30 – 2015-04-03 (×4): 20 [IU] via SUBCUTANEOUS
  Filled 2015-03-30 (×5): qty 0.2

## 2015-03-30 MED ORDER — DIAZEPAM 5 MG/ML IJ SOLN
INTRAMUSCULAR | Status: AC
  Filled 2015-03-30: qty 2

## 2015-03-30 MED ORDER — DEXTROSE 50 % IV SOLN
INTRAVENOUS | Status: AC
  Filled 2015-03-30: qty 50

## 2015-03-30 MED ORDER — ASPIRIN 81 MG PO CHEW
81.0000 mg | CHEWABLE_TABLET | Freq: Every day | ORAL | Status: DC
  Administered 2015-03-30 – 2015-04-05 (×6): 81 mg via ORAL
  Filled 2015-03-30 (×6): qty 1

## 2015-03-30 MED ORDER — DIAZEPAM 5 MG PO TABS
5.0000 mg | ORAL_TABLET | ORAL | Status: DC | PRN
  Administered 2015-03-30 – 2015-04-04 (×6): 5 mg via ORAL
  Filled 2015-03-30: qty 1
  Filled 2015-03-30 (×2): qty 3
  Filled 2015-03-30: qty 1
  Filled 2015-03-30 (×3): qty 3
  Filled 2015-03-30: qty 1

## 2015-03-30 MED ORDER — DEXTROSE 50 % IV SOLN
25.0000 mL | Freq: Once | INTRAVENOUS | Status: AC
  Administered 2015-03-30: 25 mL via INTRAVENOUS

## 2015-03-30 NOTE — Progress Notes (Signed)
MD paged regarding BP. Orders to only give Clonidine.

## 2015-03-30 NOTE — Care Management Note (Signed)
Patient is active at [facility] on TTS.  Clinic is aware of admission and I will send additional records at discharge.  Iran Sizer  Dialysis Coordinator    [phone]

## 2015-03-30 NOTE — Progress Notes (Signed)
March 30, 2015  [facility] [place] [address]  To Whom It May Concern,  Please excuse Jamesrobert Brescia from work on March 30, 2015, and March 31, 2015, as she was visiting her dad in the hospital. If you have any concerns or questions please feel free to contact us.  Thank you,      Marcelo Baldy, RN, BSN  [phone]

## 2015-03-30 NOTE — Consult Note (Signed)
  Psychiatry: Follow-up for this 80 year old man recently started on dialysis currently in the hospital with intermittent altered mental status and other medical issues. Patient seen today and I also spoke with his family who were present. Chart reviewed. Patient looked very fidgety today. He was moving around in his bed like he couldn't control himself or couldn't stop to feel comfortable. He denied being in pain but said that he just felt like he couldn't stop moving. The amount of movement was quite remarkable and looked like someone with a movement disorder. Family reports he's been doing this intermittently since starting dialysis. They also report that it seems like his overall mental status has changed since starting dialysis. Patient was alert today but his ability to communicate is somewhat limited.  I noticed that he had an MRI today which suggests likely acute lacunar infarcts probably in the basal ganglia at least.  Acute lacunar infarcts could be an explanation both for this current movement problem and also for his intermittent episodes of altered mental status.  I have ordered 5 mg of diazepam as an as needed dose prior to dialysis only because in his current condition it would be hard to imagine them being able to do dialysis when he literally could not keep any of his limbs still for more than a couple seconds. Continue to follow-up. If the movement disorder does not change I would suggest consulting neurology.

## 2015-03-30 NOTE — Progress Notes (Signed)
[facility] [facility] [place] NAME: Jason Aguilar    MR#:  [id number]  DATE OF BIRTH:  [birth year]  SUBJECTIVE:  CHIEF COMPLAINT:   Chief Complaint  Patient presents with  . Altered Mental Status     Still some confused about the events, and don't know why he is in hospital.  But denies any complains.  Michela Pitcher he feels some weak and dizzi.  REVIEW OF SYSTEMS:  CONSTITUTIONAL: No fever, some fatigue or weakness.  EYES: No blurred or double vision.  EARS, NOSE, AND THROAT: No tinnitus or ear pain.  RESPIRATORY: No cough, shortness of breath, wheezing or hemoptysis.  CARDIOVASCULAR: No chest pain, orthopnea, edema.  GASTROINTESTINAL: No nausea, vomiting, diarrhea or abdominal pain.  GENITOURINARY: No dysuria, hematuria.  ENDOCRINE: No polyuria, nocturia,  HEMATOLOGY: No anemia, easy bruising or bleeding SKIN: No rash or lesion. MUSCULOSKELETAL: No joint pain or arthritis.   NEUROLOGIC: No tingling, numbness,positive for weakness.  PSYCHIATRY: No anxiety or depression.   ROS  DRUG ALLERGIES:   Allergies  Allergen Reactions  . Losartan Swelling  . Dristan Rash    VITALS:  Blood pressure 158/41, pulse 61, temperature 97.9 F (36.6 C), temperature source Oral, resp. rate 16, height 5\' 5"  (1.651 m), weight 62.914 kg (138 lb 11.2 oz), SpO2 99 %.  PHYSICAL EXAMINATION:  GENERAL:  80 y.o.-year-old patient lying in the bed with no acute distress.  EYES: Pupils equal, round, reactive to light and accommodation. No scleral icterus. Extraocular muscles intact.  HEENT: Head atraumatic, normocephalic. Oropharynx and nasopharynx clear.  NECK:  Supple, no jugular venous distention. No thyroid enlargement, no tenderness.  LUNGS: Normal breath sounds bilaterally, no wheezing, rales,rhonchi or crepitation. No use of accessory muscles of respiration.  CARDIOVASCULAR: S1, S2 normal. No murmurs, rubs, or gallops.  ABDOMEN: Soft, nontender, nondistended.  Bowel sounds present. No organomegaly or mass.  EXTREMITIES: No pedal edema, cyanosis, or clubbing.  NEUROLOGIC: Cranial nerves II through XII are intact. Muscle strength 5/5 in all extremities. Sensation intact. Gait not checked. Able to perform finger nose test fine. PSYCHIATRIC: The patient is alert and oriented x 2.  SKIN: No obvious rash, lesion, or ulcer.   Physical Exam LABORATORY PANEL:   CBC  Recent Labs Lab 03/29/15 0534  WBC 9.5  HGB 12.1*  HCT 35.1*  PLT 245   ------------------------------------------------------------------------------------------------------------------  Chemistries   Recent Labs Lab 03/28/15 1446 03/29/15 0534  NA 135 135  K 3.6 4.3  CL 102 103  CO2 25 22  GLUCOSE 265* 232*  BUN 13 23*  CREATININE 2.67* 3.64*  3.63*  CALCIUM 9.2 9.5  MG  --  1.6*  AST 26  --   ALT 30  --   ALKPHOS 103  --   BILITOT 0.7  --    ------------------------------------------------------------------------------------------------------------------  Cardiac Enzymes No results for input(s): TROPONINI in the last 168 hours. ------------------------------------------------------------------------------------------------------------------  RADIOLOGY:  Dg Chest 2 View  03/30/2015  CLINICAL DATA:  Confusion and disorientation, type 2 diabetic, COPD. Previous smoker. EXAM: CHEST  2 VIEW COMPARISON:  Chest x-ray dated 01/02/2015. FINDINGS: Heart size is upper normal, stable. Overall cardiomediastinal silhouette is stable in size and configuration. Atherosclerotic calcifications again noted at the aortic arch. Right- sided dialysis catheter in place with tip well-positioned over the lower SVC. Mildly coarsened interstitial markings again noted bilaterally suggesting some degree of interstitial fibrosis. Lungs otherwise clear. No evidence of pneumonia. No pleural effusion. No pneumothorax seen. Mild degenerative spurring noted  within the slightly scoliotic thoracic spine.  No acute osseous abnormality. IMPRESSION: No acute findings. No evidence of pneumonia. Dialysis catheter appears adequately positioned. Chronic/incidental findings detailed above. Electronically Signed   By: Franki Cabot M.D.   On: 03/30/2015 08:17   Mr Brain Wo Contrast  03/30/2015  CLINICAL DATA:  Intermittent alteration of mental status. Involuntary movements of arms and legs. EXAM: MRI HEAD WITHOUT CONTRAST TECHNIQUE: Multiplanar, multiecho pulse sequences of the brain and surrounding structures were obtained without intravenous contrast. COMPARISON:  CT head 03/28/2015. FINDINGS: BILATERAL punctate areas of basal ganglia restricted diffusion, greater on the RIGHT, representing acute lacunar infarction. Hypoperfusion event or shower of emboli could be responsible. No cerebellar or brainstem lesions. No visible hemorrhage, mass lesion, or extra-axial fluid. Generalized atrophy. Hydrocephalus ex vacuo. Extensive white matter disease. Cannot assess for chronic hemorrhage due to motion. No flow void in the RIGHT vertebral, uncertain duration. No midline abnormality. Extracranial soft tissues grossly unremarkable. Compared with prior CT, no abnormality is detectable. IMPRESSION: BILATERAL acute basal ganglia lacunar infarctions, greater on the RIGHT. Atrophy and small vessel disease. No flow in the RIGHT vertebral, uncertain duration. Significance doubtful to the acute event, as there is no cerebellar ischemia. Electronically Signed   By: Staci Righter M.D.   On: 03/30/2015 14:47    ASSESSMENT AND PLAN:   Principal Problem:   Altered mental state Active Problems:   Hypertension   Diabetes mellitus type II, uncontrolled (HCC)   COPD (chronic obstructive pulmonary disease) (HCC)   ESRD on dialysis (HCC)   Acute delirium  Altered mental state -    Acute CVA His symptoms seem to have been somewhat rapidly declining over the   last week or 2 per his wife's report. Negative thyroid function, RPR and HIV,  and  psychiatry consult.   Checked UA and Xray chest- negative.    MRI is positive for b/l lacunar infarcts   Get Carotid doppler and Echo.   PT eval is done.    Tried Calling pt's wife and Daughter- could not talk to them.       Hypertension - currently elevated, continue home meds for this as well as IV when necessary antihypertensives to maintain his blood pressure less than 160/100.   Diabetes mellitus type II, uncontrolled ([place]) - sliding scale insulin with course: Glucose checks and carb modified diet  COPD (chronic obstructive pulmonary disease) (HCC) - not on home inhalers, will monitor and treat as needed  ESRD on dialysis [facility]) - nephrology consult for hemodialysis support.  All the records are reviewed and case discussed with Care Management/Social Workerr. Management plans discussed with the patient, family and they are in agreement.  CODE STATUS: Full.  TOTAL TIME TAKING CARE OF THIS PATIENT: 35 minutes.   POSSIBLE D/C IN 1-2 DAYS, DEPENDING ON CLINICAL CONDITION.   Vaughan Basta M.D on 03/30/2015   Between 7am to 6pm - Pager - [phone]  After 6pm go to [email or web address] - password [email or web address]  Office  [phone]  CC: Primary care physician; Valera Castle, MD  Note: This dictation was prepared with Dragon dictation along with smaller phrase technology. Any transcriptional errors that result from this process are unintentional.

## 2015-03-30 NOTE — Plan of Care (Signed)
Problem: Pain Managment: Goal: General experience of comfort will improve Outcome: Completed/Met Date Met:  03/30/15 Pt has had no C/O pain

## 2015-03-30 NOTE — Evaluation (Signed)
Physical Therapy Evaluation Patient Details Name: Jason Aguilar MRN: [id number] DOB: 10/20/1935 Today's Date: 03/30/2015   History of Present Illness  Jason Aguilar is a 80 y.o. male who presents with intermittent alteration of mental status. Patient was in the ED earlier today with a complaint of intermittent confusion. He is accompanied by his wife at that time. His initial workup in the ED proved to be largely within normal limits. 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 However, as his wife was taken back home he became confused again and tried to open the car door and jump out per her report. She brought him back to the ED for reevaluation. Hospitalists were called for admission for evaluation of potential medical cause for his altered mental state. Pt is AOx2 at time of PT evaluation. However also able to provide details regarding president and current events. Pt reports he has had 2-3 falls in the last 12 months. Unclear accuracy of history provided by patient. Confirmed details with wife via phone  Clinical Impression  Pt is AOx2 at time of evaluation. Recommendations as well as details of history confirmed with wife via telephone. Pt demonstrates adequate strength for bed mobility, transfers, and ambulation. He is unstable in ambulation without assistive device but easily corrected with rolling walker. Pt will need to use rolling walker at discharge. Patient also with higher level balance deficits as demonstrated by positive Rhomberg and single leg balance <2 seconds on each leg. Pt demonstrates poor insight and judgement. He does have 24/7 assistance at home and is not homebound. Recommend outpatient PT for balance in order to reduce fall risk. Pt has had 2-3 falls in the last 12 months. Pt will benefit from skilled PT services to address deficits in strength, balance, and  mobility in order to return to full function at home.     Follow Up Recommendations Outpatient PT (Not homebound, 24/7 assist with transportation)    Equipment Recommendations  None recommended by PT    Recommendations for Other Services       Precautions / Restrictions Precautions Precautions: Fall Restrictions Weight Bearing Restrictions: No      Mobility  Bed Mobility Overal bed mobility: Independent             General bed mobility comments: No assist from therapist however HOB elevated and bed rails utilized  Transfers Overall transfer level: Needs assistance Equipment used: Rolling walker (2 wheeled) Transfers: Sit to/from Stand Sit to Stand: Supervision         General transfer comment: Pt with fair stability during sit to stand transfer. Performs in adequate time and fair LE power noted with transfer. No significant instability noted  Ambulation/Gait Ambulation/Gait assistance: Min guard Ambulation Distance (Feet): 320 Feet Assistive device: Rolling walker (2 wheeled) Gait Pattern/deviations: Decreased step length - right;Decreased step length - left Gait velocity: Decreased but adequate for limited community mobility   General Gait Details: Pt demonstrates safe ambulation with rolling walker. Able to perform head turns, horizontal and vertical, with only minor lateral gait deviations but no LOB. Attempted ambulation without assistive device but pt reaching for walls/counters to assist with stability. He is able to ambulate short distances safely without assistive device but will need to use at discharge. Vitals monitored and HR remains <100bpm and SaO2>90% on room air.  Stairs            Wheelchair  Mobility    Modified Rankin (Stroke Patients Only)       Balance Overall balance assessment: Needs assistance Sitting-balance support: No upper extremity supported Sitting balance-Leahy Scale: Good     Standing balance support: No upper  extremity supported Standing balance-Leahy Scale: Fair Standing balance comment: Able to maintain balance in wide and narrow stance without UE suppot and eyes open. Rhomberg positive for LOB. Single leg balance <2 seconds on each side. Ambulation without assistive device demonstrates instability and gait deviations                             Pertinent Vitals/Pain Pain Assessment: 0-10 Pain Location: R lower anterior ribs. Unable to rate. Reports only hurts "a little bit." Pt reports he slid off the bed on Monday of this week. Pain Descriptors / Indicators: Aching Pain Intervention(s): Monitored during session    Home Living Family/patient expects to be discharged to:: Private residence Living Arrangements: Spouse/significant other;Children Available Help at Discharge: Family;Available 24 hours/day Type of Home: Mobile home Home Access: Ramped entrance     Home Layout: One level Home Equipment: [place] - single point;Walker - 2 wheels (No BSC, no hospital bed)      Prior Function Level of Independence: Independent with assistive device(s)         Comments: Intermittent use of spc. Pt drives. Has not been driving since confusion started 2 weeks ago. Limited community ambulator due to limited cardiopulmonary status     Hand Dominance   Dominant Hand: Right    Extremity/Trunk Assessment   Upper Extremity Assessment: Overall WFL for tasks assessed           Lower Extremity Assessment: Overall WFL for tasks assessed         Communication   Communication: No difficulties  Cognition Arousal/Alertness: Awake/alert Behavior During Therapy: Restless Overall Cognitive Status: No family/caregiver present to determine baseline cognitive functioning Area of Impairment: Orientation Orientation Level: Disoriented to;Time;Situation   Memory: Decreased short-term memory         General Comments: Is able to provide name of president and some current evetns after  extended time. Disoriented to year and season. Oriented to month however    General Comments      Exercises        Assessment/Plan    PT Assessment Patient needs continued PT services  PT Diagnosis Abnormality of gait;Difficulty walking   PT Problem List Decreased strength;Decreased activity tolerance;Decreased balance;Decreased coordination;Decreased cognition;Decreased knowledge of use of DME;Decreased safety awareness  PT Treatment Interventions DME instruction;Gait training;Stair training;Balance training;Therapeutic activities;Therapeutic exercise;Cognitive remediation;Neuromuscular re-education;Patient/family education   PT Goals (Current goals can be found in the Care Plan section) Acute Rehab PT Goals Patient Stated Goal: Return home PT Goal Formulation: With patient Time For Goal Achievement: 04/13/15 Potential to Achieve Goals: Good    Frequency Min 2X/week   Barriers to discharge        Co-evaluation               End of Session Equipment Utilized During Treatment: Gait belt Activity Tolerance: Patient tolerated treatment well Patient left: in bed;with call bell/phone within reach;with bed alarm set      Functional Assessment Tool Used: clinical judgement, Rhomberg, single leg balance Functional Limitation: Mobility: Walking and moving around Mobility: Walking and Moving Around Current Status [id number]): At least 20 percent but less than 40 percent impaired, limited or restricted Mobility: Walking and Moving Around Goal Status [phone]):  At least 1 percent but less than 20 percent impaired, limited or restricted    Time: 1005-1030 PT Time Calculation (min) (ACUTE ONLY): 25 min   Charges:   PT Evaluation $PT Eval Moderate Complexity: 1 Procedure PT Treatments $Gait Training: 8-22 mins   PT G Codes:   PT G-Codes **NOT FOR INPATIENT CLASS** Functional Assessment Tool Used: clinical judgement, Rhomberg, single leg balance Functional Limitation: Mobility:  Walking and moving around Mobility: Walking and Moving Around Current Status [id number]): At least 20 percent but less than 40 percent impaired, limited or restricted Mobility: Walking and Moving Around Goal Status [phone]): At least 1 percent but less than 20 percent impaired, limited or restricted   Phillips Grout PT, DPT   Triana Coover 03/30/2015, 11:22 AM
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
## 2015-03-30 NOTE — Progress Notes (Signed)
Pre-hd tx 

## 2015-03-30 NOTE — Progress Notes (Signed)
Hemodialysis completed. 

## 2015-03-30 NOTE — Progress Notes (Signed)
Post hd tx 

## 2015-03-31 ENCOUNTER — Observation Stay: Payer: Medicare Other

## 2015-03-31 ENCOUNTER — Inpatient Hospital Stay
Admit: 2015-03-31 | Discharge: 2015-03-31 | Disposition: A | Discharge status: Home / Self Care | Payer: Medicare Other | Attending: Internal Medicine | Admitting: Internal Medicine

## 2015-03-31 DIAGNOSIS — I6381 Other cerebral infarction due to occlusion or stenosis of small artery: Secondary | ICD-10-CM | POA: Diagnosis present

## 2015-03-31 DIAGNOSIS — I639 Cerebral infarction, unspecified: Secondary | ICD-10-CM | POA: Diagnosis present

## 2015-03-31 DIAGNOSIS — G255 Other chorea: Secondary | ICD-10-CM

## 2015-03-31 LAB — LIPID PANEL
CHOL/HDL RATIO: 3.4 ratio
Cholesterol: 116 mg/dL (ref 0–200)
HDL: 34 mg/dL — ABNORMAL LOW (ref >40)
LDL Cholesterol: 60 mg/dL (ref 0–99)
Triglycerides: 112 mg/dL (ref <150)
VLDL: 22 mg/dL (ref 0–40)

## 2015-03-31 LAB — GLUCOSE, CAPILLARY
GLUCOSE-CAPILLARY: 119 mg/dL — AB (ref 65–99)
GLUCOSE-CAPILLARY: 242 mg/dL — AB (ref 65–99)
GLUCOSE-CAPILLARY: 236 mg/dL — AB (ref 65–99)
GLUCOSE-CAPILLARY: 329 mg/dL — AB (ref 65–99)
Glucose-Capillary: 72 mg/dL (ref 65–99)
Glucose-Capillary: 98 mg/dL (ref 65–99)
Glucose-Capillary: 213 mg/dL — ABNORMAL HIGH (ref 65–99)

## 2015-03-31 MED ORDER — QUETIAPINE FUMARATE 25 MG PO TABS
25.0000 mg | ORAL_TABLET | Freq: Two times a day (BID) | ORAL | Status: DC
  Administered 2015-03-31 – 2015-04-02 (×6): 25 mg via ORAL
  Filled 2015-03-31 (×6): qty 1

## 2015-03-31 MED ORDER — ALPRAZOLAM 0.25 MG PO TABS: 0.2500 mg | ORAL_TABLET | Freq: Two times a day (BID) | ORAL | Status: DC | PRN

## 2015-03-31 MED ORDER — ROPINIROLE HCL 0.25 MG PO TABS
0.2500 mg | ORAL_TABLET | Freq: Three times a day (TID) | ORAL | Status: AC
  Administered 2015-03-31 – 2015-04-03 (×11): 0.25 mg via ORAL
  Filled 2015-03-31 (×12): qty 1

## 2015-03-31 NOTE — Progress Notes (Signed)
Inpatient Diabetes Program Recommendations  AACE/ADA: New Consensus Statement on Inpatient Glycemic Control (2015)  Target Ranges:  Prepandial:   less than 140 mg/dL      Peak postprandial:   less than 180 mg/dL (1-2 hours)      Critically ill patients:  140 - 180 mg/dL   Results for [name], [name] (MRN [id number]) as of 03/31/2015 14:09  Ref. Range 03/30/2015 07:21 03/30/2015 11:39 03/30/2015 21:11 03/30/2015 21:24 03/30/2015 21:58 03/30/2015 23:29  Glucose-Capillary Latest Ref Range: 65-99 mg/dL 247 (H) 285 (H) 70 56 (L) 115 (H) 65   Results for [name], [name] (MRN [id number]) as of 03/31/2015 14:09  Ref. Range 03/31/2015 02:01 03/31/2015 06:10 03/31/2015 08:00 03/31/2015 10:05 03/31/2015 11:52  Glucose-Capillary Latest Ref Range: 65-99 mg/dL 72 98 119 (H) 242 (H) 236 (H)    Home DM Meds: Levemir 38 units QHS       Novolin Regular 5 units tidwc  Current Insulin Orders: Levemir 20 units daily      Novolog Sensitive Correction Scale/ SSI (0-9 units) TID AC/HS     -Note patient had Hypoglycemia yesterday at bedtime and again at midnight.  Note patient has ESRD and received dialysis late yesterday afternoon.  Dialysis may have contributed to patient being at higher risk for Hypoglycemic event last PM.  -Patient ate 100% breakfast this AM.   MD- Please consider starting low dose Novolog Meal Coverage for this patient-  Novolog 3 units tidwc (Use Glycemic Control Order set to order)  Would not increase Levenir further at this time since fasting glucose was 119 mg/dl this AM.    --Will follow patient during hospitalization--  Wyn Quaker RN, MSN, CDE Diabetes Coordinator Inpatient Glycemic Control Team Team Pager: [phone] (8a-5p)

## 2015-03-31 NOTE — Progress Notes (Signed)
Physical Therapy Treatment Patient Details Name: NOELAN MESCH MRN: [id number] DOB: 01-12-1936 Today's Date: 03/31/2015    History of Present Illness Stetson Denke is a 80 y.o. male who presents with intermittent alteration of mental status. Patient was in the ED earlier today with a complaint of intermittent confusion. He is accompanied by his wife at that time. His initial workup in the ED proved to be largely within normal limits. 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 However, as his wife was taken back home he became confused again and tried to open the car door and jump out per her report. She brought him back to the ED for reevaluation. Hospitalists were called for admission for evaluation of potential medical cause for his altered mental state. Pt is AOx2 at time of PT evaluation. However also able to provide details regarding president and current events. Pt reports he has had 2-3 falls in the last 12 months. Unclear accuracy of history provided by patient. Confirmed details with wife via phone    PT Comments    Pt demonstrates good stability with use of rolling walker and is able to correct for lateral gait deviations with head turns. He is somewhat restless in bed upon arrival but ambulates well with therapist. Able to perform 2 laps around RN station. Pt must use walker for ambulation at discharge due to balance deficits. Would benefit from OP PT for balance and strengthening. No focal weakness identified today again upon examination. Pt will benefit from skilled PT services to address deficits in strength, balance, and mobility in order to return to full function at home.    Follow Up Recommendations  Outpatient PT (Not homebound, 24/7 assist with transportation)     Equipment Recommendations  None recommended by PT    Recommendations for Other Services        Precautions / Restrictions Precautions Precautions: Fall Restrictions Weight Bearing Restrictions: No    Mobility  Bed Mobility Overal bed mobility: Independent             General bed mobility comments: No assist from therapist however HOB elevated and bed rails utilized  Transfers Overall transfer level: Needs assistance Equipment used: Rolling walker (2 wheeled) Transfers: Sit to/from Stand Sit to Stand: Supervision         General transfer comment: Pt with fair stability during sit to stand transfer with rolling walker.   Ambulation/Gait Ambulation/Gait assistance: Min guard Ambulation Distance (Feet): 320 Feet Assistive device: Rolling walker (2 wheeled) Gait Pattern/deviations: Decreased step length - right;Decreased step length - left Gait velocity: Decreased but adequate for limited community mobility   General Gait Details: Pt with some instability during ambulation but corrected with use of rolling walker. Horizontal and vertical head turns performed with patient with mild lateral gait deviations but self-corrected. Pt able to perform gait speed changes upon command   Stairs            Wheelchair Mobility    Modified Rankin (Stroke Patients Only)       Balance Overall balance assessment: Needs assistance Sitting-balance support: No upper extremity supported Sitting balance-Leahy Scale: Good     Standing balance support: No upper extremity supported Standing balance-Leahy Scale: Fair Standing balance comment: Again practiced static standing balance with feet together. Pt with increased lateral trunk sway but no overt LOB  Cognition Arousal/Alertness: Awake/alert Behavior During Therapy: Restless Overall Cognitive Status: No family/caregiver present to determine baseline cognitive functioning       Memory: Decreased short-term memory         General Comments: Pt demonstrates improved cognition today.  Aware that he is here for falls and confusion. Reports that he was not told that he had a stroke. RN notified PT immediatly before session that she has provided stroke education    Exercises      General Comments        Pertinent Vitals/Pain Pain Assessment: 0-10 Pain Location: Pt continues to report R rib pain but does not rate. Pain Intervention(s): Monitored during session    Home Living                      Prior Function            PT Goals (current goals can now be found in the care plan section) Acute Rehab PT Goals Patient Stated Goal: Return home PT Goal Formulation: With patient Time For Goal Achievement: 04/13/15 Potential to Achieve Goals: Good Progress towards PT goals: Progressing toward goals    Frequency  7X/week    PT Plan Frequency needs to be updated    Co-evaluation             End of Session Equipment Utilized During Treatment: Gait belt Activity Tolerance: Patient tolerated treatment well Patient left: in bed;with call bell/phone within reach;with family/visitor present (Sitter in room)     Time: [id number] PT Time Calculation (min) (ACUTE ONLY): 16 min  Charges:  $Gait Training: 8-22 mins                    G Codes:      Lyndel Safe Antha Niday PT, DPT   Veola Cafaro 03/31/2015, 11:15 AM

## 2015-03-31 NOTE — Consult Note (Addendum)
Referring Physician: Vianne Bulls    Chief Complaint: Abnormal involuntary movements  HPI: Jason Aguilar is an 80 y.o. male who per review of the chart has has intermittent mental status changes at least for the past 2 weeks.  This was substantiated by the patient.  His wife brought him to the ED on multiple occasions before he was admitted.  After admission it appears that the patient developed abnormal involuntary movements but it is unclear exactly when this occurred.  Consult if called for further recommendations.  Date last known well: Unable to determine Time last known well: Unable to determine tPA Given: No: Unable to determine LKW  Past Medical History  Diagnosis Date  . Glaucoma     both eyes Drs. Arrie [place] and [place]  . Hematuria syndrome     s/p negative cystoscopy  . GERD (gastroesophageal reflux disease) 2003    EGD  . Allergy   . Hyperlipidemia   . Obstructive sleep apnea of adult     CPAP at 8 cm H20  . Noncompliance with medication regimen   . 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 by [facility]: low grade noninvasive  . Chronic kidney disease (CKD), stage III (moderate)     with proteinuria  . Glaucoma   . Hypertension   . Diabetes mellitus     Type 2  . Cataract due to secondary diabetes mellitus ([place])   . COPD (chronic obstructive pulmonary disease) ([place])     secondary to tobacco abuse  . History of tobacco abuse     60 pack year history; quit 1980    Past Surgical History  Procedure Laterality Date  . Elbow surgery    . Transurethral resection of bladder tumor  Mar 13 2011    [facility]  . Peripheral vascular catheterization N/A 01/03/2015    Procedure: Dialysis/Perma Catheter Insertion;  Surgeon: Katha Cabal, MD;  Location: [place] [facility];  Service: Cardiovascular;  Laterality: N/A;  . Skin graft    . Cataract extraction      Family History  Problem Relation Age of Onset  . Diabetes Mother   . Heart disease Father    Social  History:  reports that he quit smoking about 34 years ago. He has never used smokeless tobacco. He reports that he drinks about 1.8 oz of alcohol per week. He reports that he does not use illicit drugs.  Allergies:  Allergies  Allergen Reactions  . Losartan Swelling  . Dristan Rash    Medications:  I have reviewed the patient's current medications. Prior to Admission:  Prescriptions prior to admission  Medication Sig Dispense Refill Last Dose  . ALPRAZolam (XANAX) 0.25 MG tablet Take 0.25 mg by mouth at bedtime as needed for sleep.   PRN at PRN  . amLODipine (NORVASC) 5 MG tablet Take 5 mg by mouth daily.   unknown at unknown   . calcium acetate (PHOSLO) 667 MG capsule Take 2 capsules (1,334 mg total) by mouth 3 (three) times daily with meals. 180 capsule 2 unknown at unknown  . cloNIDine (CATAPRES) 0.1 MG tablet Take 1 tablet (0.1 mg total) by mouth 2 (two) times daily. 60 tablet 2 unknown at unknown  . doxazosin (CARDURA) 8 MG tablet Take 8 mg by mouth at bedtime.    unknown at unknown  . furosemide (LASIX) 40 MG tablet Take 1 tablet (40 mg total) by mouth daily. 30 tablet 2 unknown at unknown  . hydrALAZINE (APRESOLINE) 100 MG  tablet Take 100 mg by mouth 3 (three) times daily.   unknown at unknown  . insulin detemir (LEVEMIR) 100 UNIT/ML injection Inject 38 Units into the skin at bedtime.   unknown at unknown  . insulin regular (NOVOLIN R) 100 units/mL injection Inject 5 Units into the skin 3 (three) times daily before meals.   unknown at unknown  . metoprolol (LOPRESSOR) 50 MG tablet Take 50 mg by mouth 2 (two) times daily.     unknown at unknown  . simvastatin (ZOCOR) 40 MG tablet Take 40 mg by mouth at bedtime.   unknown at unknown  . sodium bicarbonate 650 MG tablet Take 650 mg by mouth 2 (two) times daily.   unknown at unknown    Scheduled: . amLODipine  5 mg Oral Daily  . aspirin  81 mg Oral Daily  . atorvastatin  20 mg Oral q1800  . cloNIDine  0.1 mg Oral BID  . clopidogrel   75 mg Oral Daily  . doxazosin  8 mg Oral QHS  . furosemide  40 mg Oral Daily  . heparin  5,000 Units Subcutaneous 3 times per day  . hydrALAZINE  100 mg Oral TID  . insulin aspart  0-9 Units Subcutaneous TID AC & HS  . insulin detemir  20 Units Subcutaneous Daily  . metoprolol  50 mg Oral BID  . QUEtiapine  25 mg Oral BID  . rOPINIRole  0.25 mg Oral TID  . sodium bicarbonate  650 mg Oral BID  . sodium chloride flush  3 mL Intravenous Q12H    ROS: History obtained from the patient  General ROS: negative for - chills, fatigue, fever, night sweats, weight gain or weight loss Psychological ROS: negative for - behavioral disorder, hallucinations, memory difficulties, mood swings or suicidal ideation Ophthalmic ROS: negative for - blurry vision, double vision, eye pain or loss of vision ENT ROS: negative for - epistaxis, nasal discharge, oral lesions, sore throat, tinnitus or vertigo Allergy and Immunology ROS: negative for - hives or itchy/watery eyes Hematological and Lymphatic ROS: negative for - bleeding problems, bruising or swollen lymph nodes Endocrine ROS: negative for - galactorrhea, hair pattern changes, polydipsia/polyuria or temperature intolerance Respiratory ROS: negative for - cough, hemoptysis, shortness of breath or wheezing Cardiovascular ROS: negative for - chest pain, dyspnea on exertion, edema or irregular heartbeat Gastrointestinal ROS: negative for - abdominal pain, diarrhea, hematemesis, nausea/vomiting or stool incontinence Genito-Urinary ROS: negative for - dysuria, hematuria, incontinence or urinary frequency/urgency Musculoskeletal ROS: negative for - joint swelling or muscular weakness Neurological ROS: as noted in HPI Dermatological ROS: negative for rash and skin lesion changes  Physical Examination: Blood pressure 158/44, pulse 87, temperature 98.2 F (36.8 C), temperature source Oral, resp. rate 20, height 5\' 5"  (1.651 m), weight 61.5 kg (135 lb 9.3 oz),  SpO2 93 %.  HEENT-  Normocephalic, no lesions, without obvious abnormality.  Normal external eye and conjunctiva.  Normal TM's bilaterally.  Normal auditory canals and external ears. Normal external nose, mucus membranes and septum.  Normal pharynx. Cardiovascular- S1, S2 normal, pulses palpable throughout   Lungs- chest clear, no wheezing, rales, normal symmetric air entry Abdomen- soft, non-tender; bowel sounds normal; no masses,  no organomegaly Extremities- no edema Lymph-no adenopathy palpable Musculoskeletal-no joint tenderness, deformity or swelling Skin-warm and dry, no hyperpigmentation, vitiligo, or suspicious lesions  Neurological Examination Mental Status: Alert, oriented, thought content appropriate.  Speech fluent without evidence of aphasia.  Able to follow 3 step commands without difficulty. Cranial  Nerves: II: Discs flat bilaterally; Visual fields grossly normal, pupils equal, round, reactive to light and accommodation III,IV, VI: ptosis not present, extra-ocular motions intact bilaterally V,VII: decrease in right NLF, facial light touch sensation normal bilaterally VIII: hearing normal bilaterally IX,X: gag reflex present XI: bilateral shoulder shrug XII: midline tongue extension Motor: Moves upper and lower extremities easily against gravity but can not cooperate for formal testing due to ballismus that is worse on the right than on the left.  If hands held will stop.   Sensory: Pinprick and light touch intact throughout, bilaterally Deep Tendon Reflexes: Unable to assess due to constant movement Plantars: Right: upgoing   Left: upgoing Cerebellar: Unable to perform due to ballismus Gait: not tested due to safety concerns   Laboratory Studies:  Basic Metabolic Panel:  Recent Labs Lab 03/28/15 1446 03/29/15 0534  NA 135 135  K 3.6 4.3  CL 102 103  CO2 25 22  GLUCOSE 265* 232*  BUN 13 23*  CREATININE 2.67* 3.64*  3.63*  CALCIUM 9.2 9.5  MG  --  1.6*   PHOS  --  4.5    Liver Function Tests:  Recent Labs Lab 03/28/15 1446  AST 26  ALT 30  ALKPHOS 103  BILITOT 0.7  PROT 6.6  ALBUMIN 3.9    Recent Labs Lab 03/28/15 1446  LIPASE 18   No results for input(s): AMMONIA in the last 168 hours.  CBC:  Recent Labs Lab 03/28/15 1446 03/29/15 0534  WBC 9.7 9.5  NEUTROABS 7.4*  --   HGB 12.5* 12.1*  HCT 36.6* 35.1*  MCV 90.4 90.1  PLT 247 245    Cardiac Enzymes: No results for input(s): CKTOTAL, CKMB, CKMBINDEX, TROPONINI in the last 168 hours.  BNP: Invalid input(s): POCBNP  CBG:  Recent Labs Lab 03/31/15 0201 03/31/15 0610 03/31/15 0800 03/31/15 1005 03/31/15 1152  GLUCAP 72 98 119* 242* 236*    Microbiology: Results for orders placed or performed during the hospital encounter of 03/03/15  Surgical pcr screen     Status: Abnormal   Collection Time: 03/03/15  2:39 PM  Result Value Ref Range Status   MRSA, PCR NEGATIVE NEGATIVE Final   Staphylococcus aureus POSITIVE (A) NEGATIVE Final    Coagulation Studies: No results for input(s): LABPROT, INR in the last 72 hours.  Urinalysis:  Recent Labs Lab 03/29/15 1728  COLORURINE YELLOW*  LABSPEC 1.009  PHURINE 6.0  GLUCOSEU >500*  HGBUR NEGATIVE  BILIRUBINUR NEGATIVE  KETONESUR NEGATIVE  PROTEINUR >500*  NITRITE NEGATIVE  LEUKOCYTESUR NEGATIVE    Lipid Panel:    Component Value Date/Time   CHOL 116 03/31/2015 0609   TRIG 112 03/31/2015 0609   HDL 34* 03/31/2015 0609   CHOLHDL 3.4 03/31/2015 0609   VLDL 22 03/31/2015 0609   LDLCALC 60 03/31/2015 0609    HgbA1C:  Lab Results  Component Value Date   HGBA1C 11.2* 03/29/2015    Urine Drug Screen:  No results found for: LABOPIA, COCAINSCRNUR, LABBENZ, AMPHETMU, THCU, LABBARB  Alcohol Level: No results for input(s): ETH in the last 168 hours.  Other results: EKG: normal sinus rhythm at 64 bpm.  Imaging: Dg Chest 2 View  03/30/2015  CLINICAL DATA:  Confusion and disorientation, type 2  diabetic, COPD. Previous smoker. EXAM: CHEST  2 VIEW COMPARISON:  Chest x-ray dated 01/02/2015. FINDINGS: Heart size is upper normal, stable. Overall cardiomediastinal silhouette is stable in size and configuration. Atherosclerotic calcifications again noted at the aortic arch. Right- sided dialysis catheter  in place with tip well-positioned over the lower SVC. Mildly coarsened interstitial markings again noted bilaterally suggesting some degree of interstitial fibrosis. Lungs otherwise clear. No evidence of pneumonia. No pleural effusion. No pneumothorax seen. Mild degenerative spurring noted within the slightly scoliotic thoracic spine. No acute osseous abnormality. IMPRESSION: No acute findings. No evidence of pneumonia. Dialysis catheter appears adequately positioned. Chronic/incidental findings detailed above. Electronically Signed   By: Franki Cabot M.D.   On: 03/30/2015 08:17   Mr Brain Wo Contrast  03/30/2015  CLINICAL DATA:  Intermittent alteration of mental status. Involuntary movements of arms and legs. EXAM: MRI HEAD WITHOUT CONTRAST TECHNIQUE: Multiplanar, multiecho pulse sequences of the brain and surrounding structures were obtained without intravenous contrast. COMPARISON:  CT head 03/28/2015. FINDINGS: BILATERAL punctate areas of basal ganglia restricted diffusion, greater on the RIGHT, representing acute lacunar infarction. Hypoperfusion event or shower of emboli could be responsible. No cerebellar or brainstem lesions. No visible hemorrhage, mass lesion, or extra-axial fluid. Generalized atrophy. Hydrocephalus ex vacuo. Extensive white matter disease. Cannot assess for chronic hemorrhage due to motion. No flow void in the RIGHT vertebral, uncertain duration. No midline abnormality. Extracranial soft tissues grossly unremarkable. Compared with prior CT, no abnormality is detectable. IMPRESSION: BILATERAL acute basal ganglia lacunar infarctions, greater on the RIGHT. Atrophy and small vessel  disease. No flow in the RIGHT vertebral, uncertain duration. Significance doubtful to the acute event, as there is no cerebellar ischemia. Electronically Signed   By: Staci Righter M.D.   On: 03/30/2015 14:47    Assessment: 80 y.o. male with ballistic movements (right greater than left).  MRI of the brain personally reviewed and shows bilateral acute basal ganglia infarcts.  This is likely the cause of the involuntary movements.  Echocardiogram and carotid dopplers are pending.  LDL 60.  A1c elevated at 11.2.  Patient on no antiplatelet therapy prior to admission.  Started on ASA during this admission.     Stroke Risk Factors - diabetes mellitus, hyperlipidemia, hypertension and sleep apnea  Plan: 1. Seroquel 25mg  BID for ballismus.  May increase as tolerated and needed for control of movements.   2. MRA  of the brain without contrast to evaluate posterior circulation once ballismus better controlled.   3. PT consult, OT consult, Speech consult 4. Echocardiogram and carotid dopplers pending.  Once able to review will determine need for TEE.   5. Prophylactic therapy-Continue ASA 6. Telemetry monitoring 7. Frequent neuro checks 8. Metabolic issues may worsen ballismus.  Agree with addressing renal insufficiency.   Alexis Goodell, MD Neurology [phone] 03/31/2015, 1:17 PM

## 2015-03-31 NOTE — Progress Notes (Signed)
Pt given Stroke Information pamphlet and instructed on importance of reading. Kim Alianah Lofton Rn-BC.

## 2015-03-31 NOTE — Progress Notes (Signed)
*  PRELIMINARY RESULTS* Echocardiogram 2D Echocardiogram has been performed.  Jason Aguilar 03/31/2015, 1:19 PM

## 2015-03-31 NOTE — Progress Notes (Addendum)
[place] [facility] [place] NAME: Jason Aguilar    MR#:  [id number]  DATE OF BIRTH:  August 30, 1935  SUBJECTIVE:found to have acute bilateral basal ganglion infarcts/continues to have involuntary movements of both hands and legs. He is awake alert and oriented. Speech is  Clear.  CHIEF COMPLAINT:   Chief Complaint  Patient presents with  . Altered Mental Status       REVIEW OF SYSTEMS:  CONSTITUTIONAL: No fever, some fatigue or weakness.  EYES: No blurred or double vision.  EARS, NOSE, AND THROAT: No tinnitus or ear pain.  RESPIRATORY: No cough, shortness of breath, wheezing or hemoptysis.  CARDIOVASCULAR: No chest pain, orthopnea, edema.  GASTROINTESTINAL: No nausea, vomiting, diarrhea or abdominal pain.  GENITOURINARY: No dysuria, hematuria.  ENDOCRINE: No polyuria, nocturia,  HEMATOLOGY: No anemia, easy bruising or bleeding SKIN: No rash or lesion. MUSCULOSKELETAL: No joint pain or arthritis.   NEUROLOGIC: No tingling, numbness,positive for weakness.  PSYCHIATRY: No anxiety or depression.   ROS  DRUG ALLERGIES:   Allergies  Allergen Reactions  . Losartan Swelling  . Dristan Rash    VITALS:  Blood pressure 158/44, pulse 87, temperature 98.2 F (36.8 C), temperature source Oral, resp. rate 20, height 5\' 5"  (1.651 m), weight 61.5 kg (135 lb 9.3 oz), SpO2 93 %.  PHYSICAL EXAMINATION:  GENERAL:  80 y.o.-year-old patient lying in the bed with no acute distress.  EYES: Pupils equal, round, reactive to light and accommodation. No scleral icterus. Extraocular muscles intact.  HEENT: Head atraumatic, normocephalic. Oropharynx and nasopharynx clear.  NECK:  Supple, no jugular venous distention. No thyroid enlargement, no tenderness.  LUNGS: Normal breath sounds bilaterally, no wheezing, rales,rhonchi or crepitation. No use of accessory muscles of respiration.  CARDIOVASCULAR: S1, S2 normal. No murmurs, rubs, or gallops.  ABDOMEN: Soft,  nontender, nondistended. Bowel sounds present. No organomegaly or mass.  EXTREMITIES: No pedal edema, cyanosis, or clubbing.  NEUROLOGIC: Cranial nerves II through XII are intact. Muscle strength 5/5 in all extremities. Sensation intact. Gait not checked. Able to perform finger nose test fine. PSYCHIATRIC: The patient is alert and oriented x 2. Continues to   Have rest less legs SKIN: No obvious rash, lesion, or ulcer.   Physical Exam LABORATORY PANEL:   CBC  Recent Labs Lab 03/29/15 0534  WBC 9.5  HGB 12.1*  HCT 35.1*  PLT 245   ------------------------------------------------------------------------------------------------------------------  Chemistries   Recent Labs Lab 03/28/15 1446 03/29/15 0534  NA 135 135  K 3.6 4.3  CL 102 103  CO2 25 22  GLUCOSE 265* 232*  BUN 13 23*  CREATININE 2.67* 3.64*  3.63*  CALCIUM 9.2 9.5  MG  --  1.6*  AST 26  --   ALT 30  --   ALKPHOS 103  --   BILITOT 0.7  --    ------------------------------------------------------------------------------------------------------------------  Cardiac Enzymes No results for input(s): TROPONINI in the last 168 hours. ------------------------------------------------------------------------------------------------------------------  RADIOLOGY:  Dg Chest 2 View  03/30/2015  CLINICAL DATA:  Confusion and disorientation, type 2 diabetic, COPD. Previous smoker. EXAM: CHEST  2 VIEW COMPARISON:  Chest x-ray dated 01/02/2015. FINDINGS: Heart size is upper normal, stable. Overall cardiomediastinal silhouette is stable in size and configuration. Atherosclerotic calcifications again noted at the aortic arch. Right- sided dialysis catheter in place with tip well-positioned over the lower SVC. Mildly coarsened interstitial markings again noted bilaterally suggesting some degree of interstitial fibrosis. Lungs otherwise clear. No evidence of pneumonia. No pleural effusion.  No pneumothorax seen. Mild degenerative  spurring noted within the slightly scoliotic thoracic spine. No acute osseous abnormality. IMPRESSION: No acute findings. No evidence of pneumonia. Dialysis catheter appears adequately positioned. Chronic/incidental findings detailed above. Electronically Signed   By: Franki Cabot M.D.   On: 03/30/2015 08:17   Mr Brain Wo Contrast  03/30/2015  CLINICAL DATA:  Intermittent alteration of mental status. Involuntary movements of arms and legs. EXAM: MRI HEAD WITHOUT CONTRAST TECHNIQUE: Multiplanar, multiecho pulse sequences of the brain and surrounding structures were obtained without intravenous contrast. COMPARISON:  CT head 03/28/2015. FINDINGS: BILATERAL punctate areas of basal ganglia restricted diffusion, greater on the RIGHT, representing acute lacunar infarction. Hypoperfusion event or shower of emboli could be responsible. No cerebellar or brainstem lesions. No visible hemorrhage, mass lesion, or extra-axial fluid. Generalized atrophy. Hydrocephalus ex vacuo. Extensive white matter disease. Cannot assess for chronic hemorrhage due to motion. No flow void in the RIGHT vertebral, uncertain duration. No midline abnormality. Extracranial soft tissues grossly unremarkable. Compared with prior CT, no abnormality is detectable. IMPRESSION: BILATERAL acute basal ganglia lacunar infarctions, greater on the RIGHT. Atrophy and small vessel disease. No flow in the RIGHT vertebral, uncertain duration. Significance doubtful to the acute event, as there is no cerebellar ischemia. Electronically Signed   By: Staci Righter M.D.   On: 03/30/2015 14:47    ASSESSMENT AND PLAN:   Principal Problem:   Altered mental state Active Problems:   Hypertension   Diabetes mellitus type II, uncontrolled (HCC)   COPD (chronic obstructive pulmonary disease) (HCC)   ESRD on dialysis (HCC)   Acute delirium  Altered mental state - due to Acute The basal ganglia infarct bilaterally.; [place] neurology consult, continue to  monitor on telemetry for possible atrial fibrillation, echocardiogram showed EF more than 55%. Ultrasound of carotid today,PT consult; continue  aspirin, Plavix.,statins       Hypertension - controlled   Diabetes mellitus type II, uncontrolled ([place]) -  Resolved hypoglycemia;change  acucheck q 4hrs   COPD (chronic obstructive pulmonary disease) (HCC) - not on home inhalers, will monitor and treat as needed   ESRD on dialysis [facility]) - nephrology consult for hemodialysis support.  Anxiety;contiue xanax,likley has RLS;started on requip. Not sure if his hand and leg movements are due to RLS  Or due to stroke. Ultrasound carotid could not be done as pt was constantly moving hands and legs All the records are reviewed and case discussed with Care Management/Social Workerr. Management plans discussed with the patient, family and they are in agreement.  CODE STATUS: Full.  TOTAL TIME TAKING CARE OF THIS PATIENT: 25  minutes.   POSSIBLE D/C IN 1-2 DAYS, DEPENDING ON CLINICAL CONDITION.   [name] M.D on 03/31/2015   Between 7am to 6pm - Pager - [phone]  After 6pm go to [email or web address] - password [email or web address] [facility]  Office  [phone]  CC: Primary care physician; Valera Castle, MD  Note: This dictation was prepared with Dragon dictation along with smaller phrase technology. Any transcriptional errors that result from this process are unintentional.

## 2015-04-01 ENCOUNTER — Inpatient Hospital Stay: Payer: Medicare Other

## 2015-04-01 LAB — RENAL FUNCTION PANEL
ALBUMIN: 3.3 g/dL — AB (ref 3.5–5.0)
ANION GAP: 11 (ref 5–15)
BUN: 34 mg/dL — ABNORMAL HIGH (ref 6–20)
CALCIUM: 9.3 mg/dL (ref 8.9–10.3)
CO2: 25 mmol/L (ref 22–32)
Chloride: 96 mmol/L — ABNORMAL LOW (ref 101–111)
Creatinine, Ser: 5.17 mg/dL — ABNORMAL HIGH (ref 0.61–1.24)
GFR, EST AFRICAN AMERICAN: 11 mL/min — AB (ref >60)
GFR, EST NON AFRICAN AMERICAN: 10 mL/min — AB (ref >60)
GLUCOSE: 219 mg/dL — AB (ref 65–99)
PHOSPHORUS: 5.1 mg/dL — AB (ref 2.5–4.6)
Potassium: 4.8 mmol/L (ref 3.5–5.1)
SODIUM: 132 mmol/L — AB (ref 135–145)

## 2015-04-01 LAB — CBC
HEMATOCRIT: 33.2 % — AB (ref 40.0–52.0)
HEMOGLOBIN: 11.2 g/dL — AB (ref 13.0–18.0)
MCH: 30.9 pg (ref 26.0–34.0)
MCHC: 33.8 g/dL (ref 32.0–36.0)
MCV: 91.4 fL (ref 80.0–100.0)
Platelets: 240 10*3/uL (ref 150–440)
RBC: 3.64 MIL/uL — AB (ref 4.40–5.90)
RDW: 14.4 % (ref 11.5–14.5)
WBC: 7.9 10*3/uL (ref 3.8–10.6)

## 2015-04-01 LAB — GLUCOSE, CAPILLARY
GLUCOSE-CAPILLARY: 306 mg/dL — AB (ref 65–99)
GLUCOSE-CAPILLARY: 139 mg/dL — AB (ref 65–99)
GLUCOSE-CAPILLARY: 190 mg/dL — AB (ref 65–99)
Glucose-Capillary: 225 mg/dL — ABNORMAL HIGH (ref 65–99)

## 2015-04-01 LAB — HEPATITIS B SURFACE ANTIGEN: Hepatitis B Surface Ag: NEGATIVE

## 2015-04-01 NOTE — Plan of Care (Signed)
Problem: Education: Goal: Knowledge of patient specific risk factors addressed and post discharge goals established will improve Outcome: Not Progressing Needs family assistance.  Problem: Coping: Goal: Ability to identify strategies to decrease anxiety will improve Outcome: Not Progressing Needs assistance.  Problem: Health Behavior/Discharge Planning: Goal: Ability to manage health-related needs will improve Outcome: Not Progressing Needs assistance.  Problem: Self-Care: Goal: Ability to participate in self-care as condition permits will improve Outcome: Not Progressing Needs assistance.

## 2015-04-01 NOTE — Progress Notes (Signed)
Pre-hd tx 

## 2015-04-01 NOTE — Progress Notes (Signed)
[facility]  ROUNDING NOTE   Subjective:    He currently goes to [facility] dialysis center and is followed by [facility] nephrology. Patient seen during dialysis   HEMODIALYSIS FLOWSHEET:  Blood Flow Rate (mL/min): 400 mL/min Arterial Pressure (mmHg): -180 mmHg Venous Pressure (mmHg): 100 mmHg Transmembrane Pressure (mmHg): 50 mmHg Ultrafiltration Rate (mL/min): 500 mL/min Dialysate Flow Rate (mL/min): 800 ml/min Conductivity: Machine : 14.1 Conductivity: Machine : 14.1 Dialysis Fluid Bolus: Normal Saline Bolus Amount (mL): 250 mL Intra-Hemodialysis Comments: PRN Valium(5mg ) given.  Patient is very restless Neurology eval suggests Ballistic movements secondary to acute basal ganglia infarcts    Objective:  Vital signs in last 24 hours:  Temp:  [97.5 F (36.4 C)-98.7 F (37.1 C)] 97.7 F (36.5 C) (03/04 0950) Pulse Rate:  [63-83] 78 (03/04 1130) Resp:  [18-24] 19 (03/04 1130) BP: (122-195)/(42-112) 141/112 mmHg (03/04 1130) SpO2:  [94 %-98 %] 95 % (03/04 0950) Weight:  [62 kg (136 lb 11 oz)] 62 kg (136 lb 11 oz) (03/04 0950)  Weight change:  Filed Weights   03/30/15 1618 03/30/15 1934 04/01/15 0950  Weight: 63 kg (138 lb 14.2 oz) 61.5 kg (135 lb 9.3 oz) 62 kg (136 lb 11 oz)    Intake/Output: I/O last 3 completed shifts: In: 2088.1 [P.O.:720; I.V.:1368.1] Out: 3100 [Urine:600; Other:2500]   Intake/Output this shift:  Total I/O In: 126.1 [I.V.:126.1] Out: -   Physical Exam: General: NAD, resting in bed  Head:  Moist oral mucosal membranes  Eyes: Anicteric,  Neck: Supple, trachea midline  Lungs:  Clear to auscultation, normal effort  Heart: Regular rate and rhythm no rubs  Abdomen:  Soft, nontender, BS present  Extremities: trace peripheral edema.  Neurologic: Restless, continuous all 4 limb movements  Skin: No lesions  Access: R IJ permcath    Basic Metabolic Panel:  Recent Labs Lab 03/28/15 1446 03/29/15 0534 04/01/15 0512  NA 135  135 132*  K 3.6 4.3 4.8  CL 102 103 96*  CO2 25 22 25   GLUCOSE 265* 232* 219*  BUN 13 23* 34*  CREATININE 2.67* 3.64*  3.63* 5.17*  CALCIUM 9.2 9.5 9.3  MG  --  1.6*  --   PHOS  --  4.5 5.1*    Liver Function Tests:  Recent Labs Lab 03/28/15 1446 04/01/15 0512  AST 26  --   ALT 30  --   ALKPHOS 103  --   BILITOT 0.7  --   PROT 6.6  --   ALBUMIN 3.9 3.3*    Recent Labs Lab 03/28/15 1446  LIPASE 18   No results for input(s): AMMONIA in the last 168 hours.  CBC:  Recent Labs Lab 03/28/15 1446 03/29/15 0534 04/01/15 0512  WBC 9.7 9.5 7.9  NEUTROABS 7.4*  --   --   HGB 12.5* 12.1* 11.2*  HCT 36.6* 35.1* 33.2*  MCV 90.4 90.1 91.4  PLT 247 245 240    Cardiac Enzymes: No results for input(s): CKTOTAL, CKMB, CKMBINDEX, TROPONINI in the last 168 hours.  BNP: Invalid input(s): POCBNP  CBG:  Recent Labs Lab 03/31/15 1152 03/31/15 1620 03/31/15 2134 03/31/15 2254 04/01/15 0717  GLUCAP 236* 213* 306* 329* 225*    Microbiology: Results for orders placed or performed during the hospital encounter of 03/03/15  Surgical pcr screen     Status: Abnormal   Collection Time: 03/03/15  2:39 PM  Result Value Ref Range Status   MRSA, PCR NEGATIVE NEGATIVE Final   Staphylococcus aureus  POSITIVE (A) NEGATIVE Final    Coagulation Studies: No results for input(s): LABPROT, INR in the last 72 hours.  Urinalysis:  Recent Labs  03/29/15 1728  COLORURINE YELLOW*  LABSPEC 1.009  PHURINE 6.0  GLUCOSEU >500*  HGBUR NEGATIVE  BILIRUBINUR NEGATIVE  KETONESUR NEGATIVE  PROTEINUR >500*  NITRITE NEGATIVE  LEUKOCYTESUR NEGATIVE      Imaging: No results found.   Medications:     . amLODipine  5 mg Oral Daily  . aspirin  81 mg Oral Daily  . atorvastatin  20 mg Oral q1800  . cloNIDine  0.1 mg Oral BID  . clopidogrel  75 mg Oral Daily  . doxazosin  8 mg Oral QHS  . furosemide  40 mg Oral Daily  . heparin  5,000 Units Subcutaneous 3 times per day  .  hydrALAZINE  100 mg Oral TID  . insulin aspart  0-9 Units Subcutaneous TID AC & HS  . insulin detemir  20 Units Subcutaneous Daily  . metoprolol  50 mg Oral BID  . QUEtiapine  25 mg Oral BID  . rOPINIRole  0.25 mg Oral TID  . sodium bicarbonate  650 mg Oral BID  . sodium chloride flush  3 mL Intravenous Q12H   acetaminophen **OR** acetaminophen, ALPRAZolam, diazepam, hydrALAZINE, ondansetron **OR** ondansetron (ZOFRAN) IV  Assessment/ Plan:  80 y.o. 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 center by [facility] nephrology, hypertension, diabetes mellitus type 2, COPD who presented with altered mental status after trying to jump out of a moving car.  1.  ESRD on HD TTHS:   Patient seen during dialysis Tolerating well  Still fidgety during treatment   Hopefully we can get through treatment uneventfully  2.  Anemia of CKD: Hgb 11.2, no indication for procrit.   3.  SHPTH:  Check ipth/phos with HD tomorrow.   4.  Altered mental status:  Pt tried to jump out of a moving car apparently per wifes report. Basal ganglia infarcts with Ballistic movements  5. Dm-2 with CKD A1c 11.2% Poorly controlled diabetes     LOS: 4 Jason Aguilar 3/4/201711:57 AM

## 2015-04-01 NOTE — Progress Notes (Signed)
Pt placed on [facility] Auto CPAP for sleep. Pt is tolerating fairly well.

## 2015-04-01 NOTE — Progress Notes (Signed)
[place] at [place] NAME: Jason Aguilar    MR#:  [id number]  DATE OF BIRTH:  [birth year]  SUBJECTIVE:found to have acute bilateral basal ganglion infarct Shunt awake alert oriented. Sitting and eating breakfast. Less movements of hands and legs today. Episode of hypoglycemia yesterday, started on now D5 IV fluids. But now blood sugar 300 range.   CHIEF COMPLAINT:   Chief Complaint  Patient presents with  . Altered Mental Status       REVIEW OF SYSTEMS:  CONSTITUTIONAL: No fever, some fatigue or weakness.  EYES: No blurred or double vision.  EARS, NOSE, AND THROAT: No tinnitus or ear pain.  RESPIRATORY: No cough, shortness of breath, wheezing or hemoptysis.  CARDIOVASCULAR: No chest pain, orthopnea, edema.  GASTROINTESTINAL: No nausea, vomiting, diarrhea or abdominal pain.  GENITOURINARY: No dysuria, hematuria.  ENDOCRINE: No polyuria, nocturia,  HEMATOLOGY: No anemia, easy bruising or bleeding SKIN: No rash or lesion. MUSCULOSKELETAL: No joint pain or arthritis.   NEUROLOGIC: No tingling, numbness,positive for weakness.  involuntary movements of hands and legs but better than yesterday. PSYCHIATRY: No anxiety or depression.   ROS  DRUG ALLERGIES:   Allergies  Allergen Reactions  . Losartan Swelling  . Dristan Rash    VITALS:  Blood pressure 148/45, pulse 66, temperature 97.5 F (36.4 C), temperature source Oral, resp. rate 20, height 5\' 5"  (1.651 m), weight 61.5 kg (135 lb 9.3 oz), SpO2 94 %.  PHYSICAL EXAMINATION:  GENERAL:  80 y.o.-year-old patient lying in the bed with no acute distress.  EYES: Pupils equal, round, reactive to light and accommodation. No scleral icterus. Extraocular muscles intact.  HEENT: Head atraumatic, normocephalic. Oropharynx and nasopharynx clear.  NECK:  Supple, no jugular venous distention. No thyroid enlargement, no tenderness.  LUNGS: Normal breath sounds bilaterally, no wheezing,  rales,rhonchi or crepitation. No use of accessory muscles of respiration.  CARDIOVASCULAR: S1, S2 normal. No murmurs, rubs, or gallops.  ABDOMEN: Soft, nontender, nondistended. Bowel sounds present. No organomegaly or mass.  EXTREMITIES: No pedal edema, cyanosis, or clubbing.  NEUROLOGIC: Cranial nerves II through XII are intact. Muscle strength 5/5 in all extremities. Sensation intact. Gait not checked. Able to perform finger nose test fine. PSYCHIATRIC: The patient is alert and oriented x 2. Continues to   Have rest less legs SKIN: No obvious rash, lesion, or ulcer.   Physical Exam LABORATORY PANEL:   CBC  Recent Labs Lab 04/01/15 0512  WBC 7.9  HGB 11.2*  HCT 33.2*  PLT 240   ------------------------------------------------------------------------------------------------------------------  Chemistries   Recent Labs Lab 03/28/15 1446 03/29/15 0534  NA 135 135  K 3.6 4.3  CL 102 103  CO2 25 22  GLUCOSE 265* 232*  BUN 13 23*  CREATININE 2.67* 3.64*  3.63*  CALCIUM 9.2 9.5  MG  --  1.6*  AST 26  --   ALT 30  --   ALKPHOS 103  --   BILITOT 0.7  --    ------------------------------------------------------------------------------------------------------------------  Cardiac Enzymes No results for input(s): TROPONINI in the last 168 hours. ------------------------------------------------------------------------------------------------------------------  RADIOLOGY:  Mr Brain Wo Contrast  03/30/2015  CLINICAL DATA:  Intermittent alteration of mental status. Involuntary movements of arms and legs. EXAM: MRI HEAD WITHOUT CONTRAST TECHNIQUE: Multiplanar, multiecho pulse sequences of the brain and surrounding structures were obtained without intravenous contrast. COMPARISON:  CT head 03/28/2015. FINDINGS: BILATERAL punctate areas of basal ganglia restricted diffusion, greater on the RIGHT, representing acute lacunar infarction. Hypoperfusion event or  shower of emboli could be  responsible. No cerebellar or brainstem lesions. No visible hemorrhage, mass lesion, or extra-axial fluid. Generalized atrophy. Hydrocephalus ex vacuo. Extensive white matter disease. Cannot assess for chronic hemorrhage due to motion. No flow void in the RIGHT vertebral, uncertain duration. No midline abnormality. Extracranial soft tissues grossly unremarkable. Compared with prior CT, no abnormality is detectable. IMPRESSION: BILATERAL acute basal ganglia lacunar infarctions, greater on the RIGHT. Atrophy and small vessel disease. No flow in the RIGHT vertebral, uncertain duration. Significance doubtful to the acute event, as there is no cerebellar ischemia. Electronically Signed   By: Staci Righter M.D.   On: 03/30/2015 14:47    ASSESSMENT AND PLAN:   Principal Problem:   Altered mental state Active Problems:   Hypertension   Diabetes mellitus type II, uncontrolled (HCC)   COPD (chronic obstructive pulmonary disease) (HCC)   ESRD on dialysis (HCC)   Acute delirium   Basal ganglia infarction (HCC)  Altered mental state - due to Acute The basal ganglia infarct bilaterally.; [place] neurology consult, continue to monitor on telemetry for possible atrial fibrillation, echocardiogram showed EF more than 55%.   Ultrasound carotids todayAppreciate neurology input hypertension - controlled   Diabetes mellitus type II, uncontrolled ([place]) -  Resolved hypoglycemia;change  acucheck q 4hrs   COPD (chronic obstructive pulmonary disease) ([place]) - not on home inhalers, will monitor and treat as needed   ESRD on dialysis [facility]) - nephrology consult for hemodialysis support. for hemodialysis Tuesday Thursday and Saturday .   Ballismus: Started on now Seroquel 25 mg by mouth twice a day. Neurology recommended MRI of the brain without contrast to evaluate for posterior circulation once the Balismus  better controlled.  AAll the records are reviewed and case discussed with Care Management/Social  Workerr. Management plans discussed with the patient, family and they are in agreement.  CODE STATUS: Full.  TOTAL TIME TAKING CARE OF THIS PATIENT: 25  minutes.   POSSIBLE D/C IN 1-2 DAYS, DEPENDING ON CLINICAL CONDITION.   Epifanio Aguilar M.D on 04/01/2015   Between 7am to 6pm - Pager - [phone]  After 6pm go to [email or web address] - password [email or web address] [place] [facility]  Office  [phone]  CC: Primary care physician; Valera Castle, MD  Note: This dictation was prepared with Dragon dictation along with smaller phrase technology. Any transcriptional errors that result from this process are unintentional.

## 2015-04-01 NOTE — Progress Notes (Signed)
Hd tx start  

## 2015-04-01 NOTE — Progress Notes (Signed)
PT Cancellation Note  Patient Details Name: DEBORAH BRANSCOME MRN: [id number] DOB: 08-15-1935   Cancelled Treatment:    Reason Eval/Treat Not Completed: Patient at procedure or test/unavailable Pt out of room for dialysis, will try back as time allows and if pt is appropriate.  [name], PT, DPT [phone]  [name] 04/01/2015, 11:15 AM

## 2015-04-02 ENCOUNTER — Inpatient Hospital Stay: Payer: Medicare Other

## 2015-04-02 LAB — GLUCOSE, CAPILLARY
GLUCOSE-CAPILLARY: 168 mg/dL — AB (ref 65–99)
Glucose-Capillary: 237 mg/dL — ABNORMAL HIGH (ref 65–99)
Glucose-Capillary: 188 mg/dL — ABNORMAL HIGH (ref 65–99)
Glucose-Capillary: 79 mg/dL (ref 65–99)

## 2015-04-02 NOTE — Progress Notes (Signed)
Pt placed on [facility] AUTO CPAP for sleep. Pt tolerating well.

## 2015-04-02 NOTE — Progress Notes (Signed)
[place] at [place] NAME: Jason Aguilar    MR#:  [id number]  DATE OF BIRTH:  [birth year]  SUBJECTIVE:found to have acute bilateral basal ganglion infarct Shunt awake alert oriented. Sitting and eating breakfast. Less movements of hands and legs /and denies any complaints.   CHIEF COMPLAINT:   Chief Complaint  Patient presents with  . Altered Mental Status       REVIEW OF SYSTEMS:  CONSTITUTIONAL: No fever, some fatigue or weakness.  EYES: No blurred or double vision.  EARS, NOSE, AND THROAT: No tinnitus or ear pain.  RESPIRATORY: No cough, shortness of breath, wheezing or hemoptysis.  CARDIOVASCULAR: No chest pain, orthopnea, edema.  GASTROINTESTINAL: No nausea, vomiting, diarrhea or abdominal pain.  GENITOURINARY: No dysuria, hematuria.  ENDOCRINE: No polyuria, nocturia,  HEMATOLOGY: No anemia, easy bruising or bleeding SKIN: No rash or lesion. MUSCULOSKELETAL: No joint pain or arthritis.   NEUROLOGIC: No tingling, numbness,positive for weakness.  involuntary movements of hands and legs but better than yesterday. PSYCHIATRY: No anxiety or depression.   ROS  DRUG ALLERGIES:   Allergies  Allergen Reactions  . Losartan Swelling  . Dristan Rash    VITALS:  Blood pressure 130/50, pulse 77, temperature 98.4 F (36.9 C), temperature source Oral, resp. rate 16, height 5\' 5"  (1.651 m), weight 62 kg (136 lb 11 oz), SpO2 100 %.  PHYSICAL EXAMINATION:  GENERAL:  80 y.o.-year-old patient lying in the bed with no acute distress.  EYES: Pupils equal, round, reactive to light and accommodation. No scleral icterus. Extraocular muscles intact.  HEENT: Head atraumatic, normocephalic. Oropharynx and nasopharynx clear.  NECK:  Supple, no jugular venous distention. No thyroid enlargement, no tenderness.  LUNGS: Normal breath sounds bilaterally, no wheezing, rales,rhonchi or crepitation. No use of accessory muscles of respiration.   CARDIOVASCULAR: S1, S2 normal. No murmurs, rubs, or gallops.  ABDOMEN: Soft, nontender, nondistended. Bowel sounds present. No organomegaly or mass.  EXTREMITIES: No pedal edema, cyanosis, or clubbing.  NEUROLOGIC: Cranial nerves II through XII are intact. Muscle strength 5/5 in all extremities. Sensation intact. Gait not checked. Able to perform finger nose test fine. PSYCHIATRIC: The patient is alert and oriented x 2. Continues to   Have rest less legs SKIN: No obvious rash, lesion, or ulcer.   Physical Exam LABORATORY PANEL:   CBC  Recent Labs Lab 04/01/15 0512  WBC 7.9  HGB 11.2*  HCT 33.2*  PLT 240   ------------------------------------------------------------------------------------------------------------------  Chemistries   Recent Labs Lab 03/28/15 1446 03/29/15 0534 04/01/15 0512  NA 135 135 132*  K 3.6 4.3 4.8  CL 102 103 96*  CO2 25 22 25   GLUCOSE 265* 232* 219*  BUN 13 23* 34*  CREATININE 2.67* 3.64*  3.63* 5.17*  CALCIUM 9.2 9.5 9.3  MG  --  1.6*  --   AST 26  --   --   ALT 30  --   --   ALKPHOS 103  --   --   BILITOT 0.7  --   --    ------------------------------------------------------------------------------------------------------------------  Cardiac Enzymes No results for input(s): TROPONINI in the last 168 hours. ------------------------------------------------------------------------------------------------------------------  RADIOLOGY:  US Carotid Bilateral  04/01/2015  CLINICAL DATA:  Stroke EXAM: BILATERAL CAROTID DUPLEX ULTRASOUND TECHNIQUE: Pearline Cables scale imaging, color Doppler and duplex ultrasound were performed of bilateral carotid and vertebral arteries in the neck. COMPARISON:  None. FINDINGS: Criteria: Quantification of carotid stenosis is based on velocity parameters that correlate the residual internal carotid  diameter with NASCET-based stenosis levels, using the diameter of the distal internal carotid lumen as the denominator for  stenosis measurement. The following velocity measurements were obtained: RIGHT ICA:  93 cm/sec CCA:  123456 cm/sec SYSTOLIC ICA/CCA RATIO:  0.8 DIASTOLIC ICA/CCA RATIO:  1.2 ECA:  281 cm/sec LEFT ICA:  100 cm/sec CCA:  [id number] cm/sec SYSTOLIC ICA/CCA RATIO:  0.7 DIASTOLIC ICA/CCA RATIO:  1.4 ECA:  147 cm/sec RIGHT CAROTID ARTERY: Moderate amount of calcified and noncalcified plaque throughout the right common carotid and internal carotid arteries. RIGHT VERTEBRAL ARTERY:  Patent with appropriate antegrade flow. LEFT CAROTID ARTERY: Moderate amount of calcified and noncalcified plaque throughout the left common carotid and internal carotid arteries. LEFT VERTEBRAL ARTERY:  Patent with appropriate antegrade flow. IMPRESSION: Moderate amount of calcified and noncalcified plaque throughout the bilateral common carotid and internal carotid arteries. Velocity measurements throughout the bilateral carotid arteries are within normal limits with no evidence of hemodynamically significant stenosis. Electronically Signed   By: Franki Cabot M.D.   On: 04/01/2015 17:02    ASSESSMENT AND PLAN:   Principal Problem:   Altered mental state Active Problems:   Hypertension   Diabetes mellitus type II, uncontrolled (HCC)   COPD (chronic obstructive pulmonary disease) (HCC)   ESRD on dialysis (HCC)   Acute delirium   Basal ganglia infarction (HCC)  Altered mental state - due to Acute The basal ganglia infarct bilaterally.; [place] neurology consult, continue to monitor on telemetry for possible atrial fibrillation, echocardiogram showed EF more than 55%.   Ultrasound of carotids did not show any hemodynamically significant stenosis.   We will order MRA of the brain without contrast to evaluate for posterior circulation today. hypertension - controlled   Diabetes mellitus type II, uncontrolled ([place]) -  Resolved hypoglycemia;change  acucheck q 4hrs   COPD (chronic obstructive pulmonary disease) ([place]) - not on home  inhalers, will monitor and treat as needed   ESRD on dialysis [facility]) - nephrology consult for hemodialysis support. for hemodialysis Tuesday Thursday and Saturday .   Ballismus: Started on now Seroquel 25 mg by mouth twice a day. Neurology recommended MRI of the brain without contrast to evaluate for posterior circulation once the Balismus  better controlled.  AAll the records are reviewed and case discussed with Care Management/Social Workerr. Management plans discussed with the patient, family and they are in agreement.  CODE STATUS: Full.  TOTAL TIME TAKING CARE OF THIS PATIENT: 25  minutes.   POSSIBLE D/C IN 1-2 DAYS, DEPENDING ON CLINICAL CONDITION.   [name] M.D on 04/02/2015   Between 7am to 6pm - Pager - [phone]  After 6pm go to [email or web address] - password [email or web address] [place] [facility]  Office  [phone]  CC: Primary care physician; Valera Castle, MD  Note: This dictation was prepared with Dragon dictation along with smaller phrase technology. Any transcriptional errors that result from this process are unintentional.

## 2015-04-02 NOTE — Progress Notes (Signed)
Physical Therapy Treatment Patient Details Name: Jason Aguilar MRN: [id number] DOB: 08/12/1935 Today's Date: 04/02/2015    History of Present Illness Jason Aguilar is a 80 y.o. male who presents with intermittent alteration of mental status. Patient came to ED with a complaint of intermittent confusion. He is accompanied by his wife at that time. His initial workup in the ED proved to be largely within normal limits. 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 However, as his wife was taken back home he became confused again and tried to open the car door and jump out per her report. She brought him back to the ED for reevaluation. Hospitalists were called for admission for evaluation of potential medical cause for his altered mental state.  Pt reports he has had 2-3 falls in the last 12 months.    PT Comments    Pt is able to ambulate w/o AD and does not have LOBs, but does have some uncontrolled movements of R U&LEs and some veering and impulsiveness.  Pt was very fatigued with the effort, but did participate with some balance activities after long walk.  He continues to have significant R sided involuntary movements that are inconsistent and appear to be more present when pt is not purposefully using the limb.    Follow Up Recommendations  Outpatient PT     Equipment Recommendations  None recommended by PT    Recommendations for Other Services       Precautions / Restrictions Precautions Precautions: Fall Restrictions Weight Bearing Restrictions: No    Mobility  Bed Mobility Overal bed mobility: Independent             General bed mobility comments: Pt has consistent and signficant R U&LE involuntary movments that he is clearly unable to control t/o mobility.  Transfers Overall transfer level: Modified independent Equipment used: None Transfers: Sit  to/from Stand Sit to Stand: Supervision         General transfer comment: Pt able to rise w/o AD and w/o assist, but needs to keep legs leaning back against bed.  Pt with some involuntary movements, though not as significant as when he was laying down.  No LE buckling, but generally with impulsive/uncontrolled movements  Ambulation/Gait Ambulation/Gait assistance: Min guard Ambulation Distance (Feet): 350 Feet Assistive device: None       General Gait Details: Pt occasionally needing to use hallway rail for balance and needs 3 brief standing rest breaks but ultimately is able to ambulate 2X around nurses' station.  He has some staggering and is signficantly fatigued, but ultimately he has no real safety concerns.    Stairs            Wheelchair Mobility    Modified Rankin (Stroke Patients Only)       Balance Overall balance assessment: Needs assistance                                  Cognition Arousal/Alertness: Awake/alert Behavior During Therapy: Impulsive;Restless Overall Cognitive Status: Within Functional Limits for tasks assessed                      Exercises Other Exercises Other Exercises: balance exercises with static standing wide and NBOS, hand held assist heel raises and marching.  Pt very fatigued after ambulation and needed  to sit after limited standing balance acts.     General Comments        Pertinent Vitals/Pain Pain Assessment: No/denies pain (does report R side generally feeling weird )    Home Living                      Prior Function            PT Goals (current goals can now be found in the care plan section) Progress towards PT goals: Progressing toward goals    Frequency  7X/week    PT Plan Current plan remains appropriate    Co-evaluation             End of Session Equipment Utilized During Treatment: Gait belt Activity Tolerance: Patient limited by fatigue Patient left: with  family/visitor present;with bed alarm set;with call bell/phone within reach     Time: [id number] PT Time Calculation (min) (ACUTE ONLY): 25 min  Charges:  $Gait Training: 8-22 mins $Neuromuscular Re-education: 8-22 mins                    G Codes:     Jason Aguilar, PT, DPT [phone]  Jason Aguilar 04/02/2015, 5:46 PM

## 2015-04-02 NOTE — Progress Notes (Signed)
[facility]  ROUNDING NOTE   Subjective:    Patient dialyzes at  [place] [place]. dialysis center and is followed by [facility] nephrology. Less movements today. Able to sit at the side of bed for breakfast Neurology eval suggests Ballistic movements secondary to acute basal ganglia infarcts    Objective:  Vital signs in last 24 hours:  Temp:  [98.1 F (36.7 C)-98.6 F (37 C)] 98.1 F (36.7 C) (03/05 1414) Pulse Rate:  [58-97] 58 (03/05 1414) Resp:  [16-22] 16 (03/05 1414) BP: (114-175)/(38-101) 114/38 mmHg (03/05 1414) SpO2:  [92 %-100 %] 95 % (03/05 1414)  Weight change:  Filed Weights   03/30/15 1618 03/30/15 1934 04/01/15 0950  Weight: 63 kg (138 lb 14.2 oz) 61.5 kg (135 lb 9.3 oz) 62 kg (136 lb 11 oz)    Intake/Output: I/O last 3 completed shifts: In: 818.1 [P.O.:240; I.V.:578.1] Out: 650 [Urine:650]   Intake/Output this shift:  Total I/O In: 240 [P.O.:240] Out: 0   Physical Exam: General: NAD, resting in bed  Head:  Moist oral mucosal membranes  Eyes: Anicteric,  Neck: Supple, trachea midline  Lungs:  Clear to auscultation, normal effort  Heart: Regular rate and rhythm no rubs  Abdomen:  Soft, nontender, BS present  Extremities: trace peripheral edema.  Neurologic:  ballistic movements - improved today  Skin: No lesions  Access: R IJ permcath    Basic Metabolic Panel:  Recent Labs Lab 03/28/15 1446 03/29/15 0534 04/01/15 0512  NA 135 135 132*  K 3.6 4.3 4.8  CL 102 103 96*  CO2 25 22 25   GLUCOSE 265* 232* 219*  BUN 13 23* 34*  CREATININE 2.67* 3.64*  3.63* 5.17*  CALCIUM 9.2 9.5 9.3  MG  --  1.6*  --   PHOS  --  4.5 5.1*    Liver Function Tests:  Recent Labs Lab 03/28/15 1446 04/01/15 0512  AST 26  --   ALT 30  --   ALKPHOS 103  --   BILITOT 0.7  --   PROT 6.6  --   ALBUMIN 3.9 3.3*    Recent Labs Lab 03/28/15 1446  LIPASE 18   No results for input(s): AMMONIA in the last 168 hours.  CBC:  Recent Labs Lab  03/28/15 1446 03/29/15 0534 04/01/15 0512  WBC 9.7 9.5 7.9  NEUTROABS 7.4*  --   --   HGB 12.5* 12.1* 11.2*  HCT 36.6* 35.1* 33.2*  MCV 90.4 90.1 91.4  PLT 247 245 240    Cardiac Enzymes: No results for input(s): CKTOTAL, CKMB, CKMBINDEX, TROPONINI in the last 168 hours.  BNP: Invalid input(s): POCBNP  CBG:  Recent Labs Lab 04/01/15 0717 04/01/15 1658 04/01/15 2105 04/02/15 0747 04/02/15 1149  GLUCAP 225* 139* 190* 237* 188*    Microbiology: Results for orders placed or performed during the hospital encounter of 03/03/15  Surgical pcr screen     Status: Abnormal   Collection Time: 03/03/15  2:39 PM  Result Value Ref Range Status   MRSA, PCR NEGATIVE NEGATIVE Final   Staphylococcus aureus POSITIVE (A) NEGATIVE Final    Coagulation Studies: No results for input(s): LABPROT, INR in the last 72 hours.  Urinalysis: No results for input(s): COLORURINE, LABSPEC, PHURINE, GLUCOSEU, HGBUR, BILIRUBINUR, KETONESUR, PROTEINUR, UROBILINOGEN, NITRITE, LEUKOCYTESUR in the last 72 hours.  Invalid input(s): APPERANCEUR    Imaging: US Carotid Bilateral  04/01/2015  CLINICAL DATA:  Stroke EXAM: BILATERAL CAROTID DUPLEX ULTRASOUND TECHNIQUE: Pearline Cables scale imaging, color Doppler and duplex  ultrasound were performed of bilateral carotid and vertebral arteries in the neck. COMPARISON:  None. FINDINGS: Criteria: Quantification of carotid stenosis is based on velocity parameters that correlate the residual internal carotid diameter with NASCET-based stenosis levels, using the diameter of the distal internal carotid lumen as the denominator for stenosis measurement. The following velocity measurements were obtained: RIGHT ICA:  93 cm/sec CCA:  123456 cm/sec SYSTOLIC ICA/CCA RATIO:  0.8 DIASTOLIC ICA/CCA RATIO:  1.2 ECA:  281 cm/sec LEFT ICA:  100 cm/sec CCA:  A999333 cm/sec SYSTOLIC ICA/CCA RATIO:  0.7 DIASTOLIC ICA/CCA RATIO:  1.4 ECA:  147 cm/sec RIGHT CAROTID ARTERY: Moderate amount of calcified and  noncalcified plaque throughout the right common carotid and internal carotid arteries. RIGHT VERTEBRAL ARTERY:  Patent with appropriate antegrade flow. LEFT CAROTID ARTERY: Moderate amount of calcified and noncalcified plaque throughout the left common carotid and internal carotid arteries. LEFT VERTEBRAL ARTERY:  Patent with appropriate antegrade flow. IMPRESSION: Moderate amount of calcified and noncalcified plaque throughout the bilateral common carotid and internal carotid arteries. Velocity measurements throughout the bilateral carotid arteries are within normal limits with no evidence of hemodynamically significant stenosis. Electronically Signed   By: Franki Cabot M.D.   On: 04/01/2015 17:02     Medications:     . amLODipine  5 mg Oral Daily  . aspirin  81 mg Oral Daily  . atorvastatin  20 mg Oral q1800  . cloNIDine  0.1 mg Oral BID  . clopidogrel  75 mg Oral Daily  . doxazosin  8 mg Oral QHS  . furosemide  40 mg Oral Daily  . heparin  5,000 Units Subcutaneous 3 times per day  . hydrALAZINE  100 mg Oral TID  . insulin aspart  0-9 Units Subcutaneous TID AC & HS  . insulin detemir  20 Units Subcutaneous Daily  . metoprolol  50 mg Oral BID  . QUEtiapine  25 mg Oral BID  . rOPINIRole  0.25 mg Oral TID  . sodium bicarbonate  650 mg Oral BID  . sodium chloride flush  3 mL Intravenous Q12H   acetaminophen **OR** acetaminophen, ALPRAZolam, diazepam, hydrALAZINE, ondansetron **OR** ondansetron (ZOFRAN) IV  Assessment/ Plan:  80 y.o. 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 [place]. dialysis center by [facility] nephrology, hypertension, diabetes mellitus type 2, COPD who presented with altered mental status after trying to jump out of a moving car.  1.  ESRD on HD TTHS:   Next treatment on Tuesday  2.  Anemia  of CKD: Hgb 11.2, no indication for procrit.   3.  SHPTH:   - monitor phos with HD   4.  Altered mental status:  Pt tried to jump out of a moving car apparently per wifes report. Basal ganglia infarcts with Ballistic movements - improved during hospitalization  5. Dm-2 with CKD A1c 11.2% Poorly controlled diabetes     LOS: 5 Eternity Dexter 3/5/20172:40 PM

## 2015-04-02 NOTE — Progress Notes (Signed)
Tried to get in touch with Dr.Konidena did not call back. Notified Dr. Molli Hazard Prime Doc that Xray unable to be done on right shoulder due to patient having involuntary movements and can not hold still for procedure. Will D/C order.

## 2015-04-02 NOTE — Progress Notes (Signed)
Per Dr. Vianne Bulls okay to give all BP meds for blood pressure of 130/50

## 2015-04-02 NOTE — Progress Notes (Signed)
Notified Dr. Vianne Bulls that patient had his shoulder pop last night and that it is now sore. Per Dr. Vianne Bulls okay to place order for x-ray of shoulder.

## 2015-04-02 NOTE — Progress Notes (Addendum)
Notified Dr. Vianne Bulls that patients IV infiltrated. Due to uncontrollable movements was unable to get new IV started after multiple attempts. Okay per MD to leave IV out

## 2015-04-03 ENCOUNTER — Inpatient Hospital Stay: Payer: Medicare Other

## 2015-04-03 LAB — GLUCOSE, CAPILLARY
GLUCOSE-CAPILLARY: 186 mg/dL — AB (ref 65–99)
GLUCOSE-CAPILLARY: 203 mg/dL — AB (ref 65–99)
GLUCOSE-CAPILLARY: 150 mg/dL — AB (ref 65–99)
GLUCOSE-CAPILLARY: 78 mg/dL (ref 65–99)
Glucose-Capillary: 103 mg/dL — ABNORMAL HIGH (ref 65–99)

## 2015-04-03 MED ORDER — QUETIAPINE FUMARATE 25 MG PO TABS
50.0000 mg | ORAL_TABLET | Freq: Two times a day (BID) | ORAL | Status: DC
  Administered 2015-04-03 (×2): 50 mg via ORAL
  Filled 2015-04-03 (×2): qty 2

## 2015-04-03 MED ORDER — INSULIN DETEMIR 100 UNIT/ML ~~LOC~~ SOLN
25.0000 [IU] | Freq: Every day | SUBCUTANEOUS | Status: DC
  Filled 2015-04-03: qty 0.25

## 2015-04-03 MED ORDER — CALCIUM ACETATE (PHOS BINDER) 667 MG PO CAPS
1334.0000 mg | ORAL_CAPSULE | Freq: Three times a day (TID) | ORAL | Status: DC
  Administered 2015-04-03 – 2015-04-05 (×4): 1334 mg via ORAL
  Filled 2015-04-03 (×4): qty 2

## 2015-04-03 MED ORDER — DOCUSATE SODIUM 100 MG PO CAPS
100.0000 mg | ORAL_CAPSULE | Freq: Two times a day (BID) | ORAL | Status: DC
  Administered 2015-04-03 – 2015-04-05 (×4): 100 mg via ORAL
  Filled 2015-04-03 (×4): qty 1

## 2015-04-03 MED ORDER — CARBIDOPA-LEVODOPA 25-100 MG PO TABS
1.0000 | ORAL_TABLET | Freq: Once | ORAL | Status: AC
  Administered 2015-04-03: 1 via ORAL
  Filled 2015-04-03: qty 1

## 2015-04-03 MED ORDER — FAMOTIDINE 20 MG PO TABS
10.0000 mg | ORAL_TABLET | Freq: Every day | ORAL | Status: DC
  Administered 2015-04-03 – 2015-04-05 (×2): 10 mg via ORAL
  Filled 2015-04-03 (×2): qty 1

## 2015-04-03 NOTE — Care Management Important Message (Signed)
Important Message  Patient Details  Name: Jason Aguilar MRN: [id number] Date of Birth: Jan 08, 1936   Medicare Important Message Given:  Yes    Juliann Pulse A Milicent Acheampong 04/03/2015, 1:25 PM

## 2015-04-03 NOTE — Progress Notes (Signed)
[facility]  ROUNDING NOTE   Subjective:    Family at bedside. MRI with acute basal ganglia infarcts. MRA done today.   Objective:  Vital signs in last 24 hours:  Temp:  [97.9 F (36.6 C)-98.1 F (36.7 C)] 97.9 F (36.6 C) (03/06 1300) Pulse Rate:  [59-86] 59 (03/06 1300) Resp:  [15-20] 15 (03/06 1300) BP: (134-161)/(46-68) 140/46 mmHg (03/06 1300) SpO2:  [94 %-99 %] 95 % (03/06 1300)  Weight change:  Filed Weights   03/30/15 1618 03/30/15 1934 04/01/15 0950  Weight: 63 kg (138 lb 14.2 oz) 61.5 kg (135 lb 9.3 oz) 62 kg (136 lb 11 oz)    Intake/Output: I/O last 3 completed shifts: In: 42 [P.O.:600; I.V.:3] Out: 360 [Urine:360]   Intake/Output this shift:  Total I/O In: 240 [P.O.:240] Out: 200 [Urine:200]  Physical Exam: General: NAD, resting in bed  Head:  Moist oral mucosal membranes  Eyes: Anicteric,  Neck: Supple, trachea midline  Lungs:  Clear to auscultation, normal effort  Heart: Regular rate and rhythm no rubs  Abdomen:  Soft, nontender, BS present  Extremities: trace peripheral edema.  Neurologic: Right hemiballismus  Skin: No lesions  Access: R IJ permcath    Basic Metabolic Panel:  Recent Labs Lab 03/28/15 1446 03/29/15 0534 04/01/15 0512  NA 135 135 132*  K 3.6 4.3 4.8  CL 102 103 96*  CO2 25 22 25   GLUCOSE 265* 232* 219*  BUN 13 23* 34*  CREATININE 2.67* 3.64*  3.63* 5.17*  CALCIUM 9.2 9.5 9.3  MG  --  1.6*  --   PHOS  --  4.5 5.1*    Liver Function Tests:  Recent Labs Lab 03/28/15 1446 04/01/15 0512  AST 26  --   ALT 30  --   ALKPHOS 103  --   BILITOT 0.7  --   PROT 6.6  --   ALBUMIN 3.9 3.3*    Recent Labs Lab 03/28/15 1446  LIPASE 18   No results for input(s): AMMONIA in the last 168 hours.  CBC:  Recent Labs Lab 03/28/15 1446 03/29/15 0534 04/01/15 0512  WBC 9.7 9.5 7.9  NEUTROABS 7.4*  --   --   HGB 12.5* 12.1* 11.2*  HCT 36.6* 35.1* 33.2*  MCV 90.4 90.1 91.4  PLT 247 245 240     Cardiac Enzymes: No results for input(s): CKTOTAL, CKMB, CKMBINDEX, TROPONINI in the last 168 hours.  BNP: Invalid input(s): POCBNP  CBG:  Recent Labs Lab 04/02/15 1640 04/02/15 2115 04/03/15 0744 04/03/15 1132 04/03/15 1316  GLUCAP 168* 79 186* 203* 150*    Microbiology: Results for orders placed or performed during the hospital encounter of 03/03/15  Surgical pcr screen     Status: Abnormal   Collection Time: 03/03/15  2:39 PM  Result Value Ref Range Status   MRSA, PCR NEGATIVE NEGATIVE Final   Staphylococcus aureus POSITIVE (A) NEGATIVE Final    Coagulation Studies: No results for input(s): LABPROT, INR in the last 72 hours.  Urinalysis: No results for input(s): COLORURINE, LABSPEC, PHURINE, GLUCOSEU, HGBUR, BILIRUBINUR, KETONESUR, PROTEINUR, UROBILINOGEN, NITRITE, LEUKOCYTESUR in the last 72 hours.  Invalid input(s): APPERANCEUR    Imaging: Mr Virgel Paling Wo Contrast  04/03/2015  CLINICAL DATA:  80 year old male with bilateral basal ganglia infarcts. Altered mental status. Subsequent encounter. EXAM: MRA HEAD WITHOUT CONTRAST TECHNIQUE: Angiographic images of the Circle of Willis were obtained using MRA technique without intravenous contrast. COMPARISON:  03/30/2015 brain MR. FINDINGS: Right vertebral artery occluded at  the level of the foramen magnum. Mild narrowing and irregularity left vertebral artery. No significant stenosis of the basilar artery. Mild to moderate narrowing and irregularity superior cerebellar artery greater on the left. Fetal type contribution to the right posterior cerebral artery. Mild to moderate narrowing and irregularity mid to distal posterior cerebral arteries. Nonvisualized posterior inferior cerebellar artery and anterior inferior cerebellar artery bilaterally. Marked tandem stenosis right internal carotid artery cavernous and precavernous segment. Mild to moderate narrowing supraclinoid segment with poststenotic dilation. Marked narrowing  proximal right posterior communicating artery. Hypoplastic A1 segment right anterior cerebral artery with superimposed marked narrowing. Mild moderate narrowing distal M1 segment right middle cerebral artery. Moderate to marked narrowing cavernous segment left internal carotid artery with mild dilation supraclinoid aspect. Mild narrowing proximal A1 segment left anterior cerebral artery. Mild narrowing and irregularity A2 segment anterior cerebral artery bilaterally (supplied from the left). Mild to moderate narrowing left middle cerebral artery bifurcation. Moderate to marked narrowing middle cerebral artery branch vessels bilaterally with decrease number of visualized middle cerebral artery branch vessels on the right. Bulge proximal M1 segment left middle cerebral artery felt to be origin of a vessel rather than aneurysm. Exam is slightly motion degraded. IMPRESSION: Significant intracranial atherosclerotic type changes noted on this motion degraded examination as detailed above Electronically Signed   By: Genia Del M.D.   On: 04/03/2015 13:11   US Carotid Bilateral  04/01/2015  CLINICAL DATA:  Stroke EXAM: BILATERAL CAROTID DUPLEX ULTRASOUND TECHNIQUE: Pearline Cables scale imaging, color Doppler and duplex ultrasound were performed of bilateral carotid and vertebral arteries in the neck. COMPARISON:  None. FINDINGS: Criteria: Quantification of carotid stenosis is based on velocity parameters that correlate the residual internal carotid diameter with NASCET-based stenosis levels, using the diameter of the distal internal carotid lumen as the denominator for stenosis measurement. The following velocity measurements were obtained: RIGHT ICA:  93 cm/sec CCA:  123456 cm/sec SYSTOLIC ICA/CCA RATIO:  0.8 DIASTOLIC ICA/CCA RATIO:  1.2 ECA:  281 cm/sec LEFT ICA:  100 cm/sec CCA:  A999333 cm/sec SYSTOLIC ICA/CCA RATIO:  0.7 DIASTOLIC ICA/CCA RATIO:  1.4 ECA:  147 cm/sec RIGHT CAROTID ARTERY: Moderate amount of calcified and  noncalcified plaque throughout the right common carotid and internal carotid arteries. RIGHT VERTEBRAL ARTERY:  Patent with appropriate antegrade flow. LEFT CAROTID ARTERY: Moderate amount of calcified and noncalcified plaque throughout the left common carotid and internal carotid arteries. LEFT VERTEBRAL ARTERY:  Patent with appropriate antegrade flow. IMPRESSION: Moderate amount of calcified and noncalcified plaque throughout the bilateral common carotid and internal carotid arteries. Velocity measurements throughout the bilateral carotid arteries are within normal limits with no evidence of hemodynamically significant stenosis. Electronically Signed   By: Franki Cabot M.D.   On: 04/01/2015 17:02     Medications:     . amLODipine  5 mg Oral Daily  . aspirin  81 mg Oral Daily  . atorvastatin  20 mg Oral q1800  . carbidopa-levodopa  1 tablet Oral Once  . cloNIDine  0.1 mg Oral BID  . clopidogrel  75 mg Oral Daily  . docusate sodium  100 mg Oral BID  . doxazosin  8 mg Oral QHS  . famotidine  10 mg Oral Daily  . furosemide  40 mg Oral Daily  . heparin  5,000 Units Subcutaneous 3 times per day  . hydrALAZINE  100 mg Oral TID  . insulin aspart  0-9 Units Subcutaneous TID AC & HS  . [START ON 04/04/2015] insulin detemir  25  Units Subcutaneous Daily  . metoprolol  50 mg Oral BID  . QUEtiapine  50 mg Oral BID  . rOPINIRole  0.25 mg Oral TID  . sodium bicarbonate  650 mg Oral BID  . sodium chloride flush  3 mL Intravenous Q12H   acetaminophen **OR** acetaminophen, ALPRAZolam, diazepam, hydrALAZINE, ondansetron **OR** ondansetron (ZOFRAN) IV  Assessment/ Plan:  80 y.o. 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 [place]. dialysis center by [facility] nephrology, hypertension, diabetes mellitus type 2, COPD who presented  with altered mental status after trying to jump out of a moving car.  [facility]phrology [facility]  1.  ESRD on HD TTHS:   Next treatment on Tuesday - unclear why patient is getting sodium bicarbonate.   2.  Anemia of CKD: Hgb 11.2, no indication for procrit with ischemic even.   3.  Secondary Hyperparathyroidism:  Phosphorus at goal 5.1 - calcium acetate with meals.   4. Diabetes Mellitus type II with chronic kidney disease: insulin dependent. Not well controlled. Hemoglobin A1c of 11.2%  5. Hypertension: blood pressure not at goal: will let ride high due to ischemic event.  -  Amlodipine, clonidine, doxazosin, furosemide, hydralazine, metoprolol   LOS: 6 Jason Aguilar 3/6/20172:46 PM

## 2015-04-03 NOTE — Progress Notes (Addendum)
PT Cancellation Note  Patient Details Name: Jason Aguilar MRN: [id number] DOB: [birth year]   Cancelled Treatment:    Reason Eval/Treat Not Completed: Patient at procedure or test/unavailable. Treatment attempted; pt not available. Will re attempt treatment later today.    Charlaine Dalton, [place] 04/03/2015, 12:03 PM

## 2015-04-03 NOTE — Progress Notes (Signed)
Pt placed on [facility] CPAP for sleep. Tolerating well.

## 2015-04-03 NOTE — Progress Notes (Signed)
[name] NAME: Jason Aguilar    MR#:  [id number]  DATE OF BIRTH:  [birth year]  SUBJECTIVE:found to have acute bilateral basal ganglion infarct Shunt awake alert oriented.still has involuntary movements of hands and legs.  CHIEF COMPLAINT:   Chief Complaint  Patient presents with  . Altered Mental Status       REVIEW OF SYSTEMS:  CONSTITUTIONAL: No fever, some fatigue or weakness.  EYES: No blurred or double vision.  EARS, NOSE, AND THROAT: No tinnitus or ear pain.  RESPIRATORY: No cough, shortness of breath, wheezing or hemoptysis.  CARDIOVASCULAR: No chest pain, orthopnea, edema.  GASTROINTESTINAL: No nausea, vomiting, diarrhea or abdominal pain.  GENITOURINARY: No dysuria, hematuria.  ENDOCRINE: No polyuria, nocturia,  HEMATOLOGY: No anemia, easy bruising or bleeding SKIN: No rash or lesion. MUSCULOSKELETAL: No joint pain or arthritis.   NEUROLOGIC: No tingling, numbness,positive for weakness.  involuntary movements of hands and legs but better than yesterday. PSYCHIATRY: No anxiety or depression.   ROS  DRUG ALLERGIES:   Allergies  Allergen Reactions  . Losartan Swelling  . Dristan Rash    VITALS:  Blood pressure 154/54, pulse 62, temperature 98 F (36.7 C), temperature source Oral, resp. rate 20, height 5\' 5"  (1.651 m), weight 62 kg (136 lb 11 oz), SpO2 99 %.  PHYSICAL EXAMINATION:  GENERAL:  80 y.o.-year-old patient lying in the bed with no acute distress.  EYES: Pupils equal, round, reactive to light and accommodation. No scleral icterus. Extraocular muscles intact.  HEENT: Head atraumatic, normocephalic. Oropharynx and nasopharynx clear.  NECK:  Supple, no jugular venous distention. No thyroid enlargement, no tenderness.  LUNGS: Normal breath sounds bilaterally, no wheezing, rales,rhonchi or crepitation. No use of accessory muscles of respiration.  CARDIOVASCULAR: S1, S2 normal. No murmurs, rubs, or  gallops.  ABDOMEN: Soft, nontender, nondistended. Bowel sounds present. No organomegaly or mass.  EXTREMITIES: No pedal edema, cyanosis, or clubbing.  NEUROLOGIC: Cranial nerves II through XII are intact. Muscle strength 5/5 in all extremities. Sensation intact. Gait not checked. Able to perform finger nose test fine. PSYCHIATRIC: The patient is alert and oriented x 2. Continues to   Have rest less legs SKIN: No obvious rash, lesion, or ulcer.   Physical Exam LABORATORY PANEL:   CBC  Recent Labs Lab 04/01/15 0512  WBC 7.9  HGB 11.2*  HCT 33.2*  PLT 240   ------------------------------------------------------------------------------------------------------------------  Chemistries   Recent Labs Lab 03/28/15 1446 03/29/15 0534 04/01/15 0512  NA 135 135 132*  K 3.6 4.3 4.8  CL 102 103 96*  CO2 25 22 25   GLUCOSE 265* 232* 219*  BUN 13 23* 34*  CREATININE 2.67* 3.64*  3.63* 5.17*  CALCIUM 9.2 9.5 9.3  MG  --  1.6*  --   AST 26  --   --   ALT 30  --   --   ALKPHOS 103  --   --   BILITOT 0.7  --   --    ------------------------------------------------------------------------------------------------------------------  Cardiac Enzymes No results for input(s): TROPONINI in the last 168 hours. ------------------------------------------------------------------------------------------------------------------  RADIOLOGY:  US Carotid Bilateral  04/01/2015  CLINICAL DATA:  Stroke EXAM: BILATERAL CAROTID DUPLEX ULTRASOUND TECHNIQUE: Pearline Cables scale imaging, color Doppler and duplex ultrasound were performed of bilateral carotid and vertebral arteries in the neck. COMPARISON:  None. FINDINGS: Criteria: Quantification of carotid stenosis is based on velocity parameters that correlate the residual internal carotid diameter with NASCET-based stenosis levels, using the diameter  of the distal internal carotid lumen as the denominator for stenosis measurement. The following velocity  measurements were obtained: RIGHT ICA:  93 cm/sec CCA:  123456 cm/sec SYSTOLIC ICA/CCA RATIO:  0.8 DIASTOLIC ICA/CCA RATIO:  1.2 ECA:  281 cm/sec LEFT ICA:  100 cm/sec CCA:  A999333 cm/sec SYSTOLIC ICA/CCA RATIO:  0.7 DIASTOLIC ICA/CCA RATIO:  1.4 ECA:  147 cm/sec RIGHT CAROTID ARTERY: Moderate amount of calcified and noncalcified plaque throughout the right common carotid and internal carotid arteries. RIGHT VERTEBRAL ARTERY:  Patent with appropriate antegrade flow. LEFT CAROTID ARTERY: Moderate amount of calcified and noncalcified plaque throughout the left common carotid and internal carotid arteries. LEFT VERTEBRAL ARTERY:  Patent with appropriate antegrade flow. IMPRESSION: Moderate amount of calcified and noncalcified plaque throughout the bilateral common carotid and internal carotid arteries. Velocity measurements throughout the bilateral carotid arteries are within normal limits with no evidence of hemodynamically significant stenosis. Electronically Signed   By: Franki Cabot M.D.   On: 04/01/2015 17:02    ASSESSMENT AND PLAN:   Principal Problem:   Altered mental state Active Problems:   Hypertension   Diabetes mellitus type II, uncontrolled (HCC)   COPD (chronic obstructive pulmonary disease) (HCC)   ESRD on dialysis (HCC)   Acute delirium   Basal ganglia infarction (HCC)  Altered mental state - due to Acute The basal ganglia infarct bilaterally.; [place] neurology consult, continue to monitor on telemetry for possible atrial fibrillation, echocardiogram showed EF more than 55%.   Ultrasound of carotids did not show any hemodynamically significant stenosis.   We will order MRA of the brain without contrast to evaluate for posterior circulation today.  hypertension - controlled   Diabetes mellitus type II,'controlled   COPD (chronic obstructive pulmonary disease) (HCC) - not on home inhalers, will monitor and treat as needed   ESRD on dialysis [facility]) - nephrology consult for  hemodialysis support. for hemodialysis Tuesday Thursday and Saturday .   BIlateral Ballismus;continue Seroquel 50 mg po BID.  AAll the records are reviewed and case discussed with Care Management/Social Workerr. Management plans discussed with the patient, family and they are in agreement.  CODE STATUS: Full.  TOTAL TIME TAKING CARE OF THIS PATIENT: 25  minutes.   POSSIBLE D/C IN 1-2 DAYS, DEPENDING ON CLINICAL CONDITION.   [name] M.D on 04/03/2015   Between 7am to 6pm - Pager - [phone]  After 6pm go to [email or web address] - password [email or web address] [facility]  Office  [phone]  CC: Primary care physician; Valera Castle, MD  Note: This dictation was prepared with Dragon dictation along with smaller phrase technology. Any transcriptional errors that result from this process are unintentional.

## 2015-04-03 NOTE — Progress Notes (Signed)
Subjective: Ballismus improved somewhat but continues to be uncomfortable for patient and interfering with care.  Tolerating well without side effects.     Objective: Current vital signs: BP 154/54 mmHg  Pulse 62  Temp(Src) 98 F (36.7 C) (Oral)  Resp 20  Ht 5\' 5"  (1.651 m)  Wt 62 kg (136 lb 11 oz)  BMI 22.75 kg/m2  SpO2 99% Vital signs in last 24 hours: Temp:  [98 F (36.7 C)-98.1 F (36.7 C)] 98 F (36.7 C) (03/06 0603) Pulse Rate:  [58-86] 62 (03/06 0603) Resp:  [16-20] 20 (03/06 0603) BP: (114-154)/(38-68) 154/54 mmHg (03/06 0603) SpO2:  [94 %-99 %] 99 % (03/06 0603)  Intake/Output from previous day: 03/05 0701 - 03/06 0700 In: 600 [P.O.:600] Out: 60 [Urine:60] Intake/Output this shift:   Nutritional status: Diet heart healthy/carb modified Room service appropriate?: Yes; Fluid consistency:: Thin  Neurologic Exam: Mental Status: Alert, oriented, thought content appropriate. Speech fluent without evidence of aphasia. Able to follow 3 step commands without difficulty. Cranial Nerves: II: Discs flat bilaterally; Visual fields grossly normal, pupils equal, round, reactive to light and accommodation III,IV, VI: ptosis not present, extra-ocular motions intact bilaterally V,VII: decrease in right NLF, facial light touch sensation normal bilaterally VIII: hearing normal bilaterally IX,X: gag reflex present XI: bilateral shoulder shrug XII: midline tongue extension Motor: Moves upper and lower extremities easily against gravity.  Ballismus continues but movements not as large.     Lab Results: Basic Metabolic Panel:  Recent Labs Lab 03/28/15 1446 03/29/15 0534 04/01/15 0512  NA 135 135 132*  K 3.6 4.3 4.8  CL 102 103 96*  CO2 25 22 25   GLUCOSE 265* 232* 219*  BUN 13 23* 34*  CREATININE 2.67* 3.64*  3.63* 5.17*  CALCIUM 9.2 9.5 9.3  MG  --  1.6*  --   PHOS  --  4.5 5.1*    Liver Function Tests:  Recent Labs Lab 03/28/15 1446 04/01/15 0512  AST 26   --   ALT 30  --   ALKPHOS 103  --   BILITOT 0.7  --   PROT 6.6  --   ALBUMIN 3.9 3.3*    Recent Labs Lab 03/28/15 1446  LIPASE 18   No results for input(s): AMMONIA in the last 168 hours.  CBC:  Recent Labs Lab 03/28/15 1446 03/29/15 0534 04/01/15 0512  WBC 9.7 9.5 7.9  NEUTROABS 7.4*  --   --   HGB 12.5* 12.1* 11.2*  HCT 36.6* 35.1* 33.2*  MCV 90.4 90.1 91.4  PLT 247 245 240    Cardiac Enzymes: No results for input(s): CKTOTAL, CKMB, CKMBINDEX, TROPONINI in the last 168 hours.  Lipid Panel:  Recent Labs Lab 03/31/15 0609  CHOL 116  TRIG 112  HDL 34*  CHOLHDL 3.4  VLDL 22  LDLCALC 60    CBG:  Recent Labs Lab 04/02/15 0747 04/02/15 1149 04/02/15 1640 04/02/15 2115 04/03/15 0744  GLUCAP 237* 188* 168* 56 186*    Microbiology: Results for orders placed or performed during the hospital encounter of 03/03/15  Surgical pcr screen     Status: Abnormal   Collection Time: 03/03/15  2:39 PM  Result Value Ref Range Status   MRSA, PCR NEGATIVE NEGATIVE Final   Staphylococcus aureus POSITIVE (A) NEGATIVE Final    Coagulation Studies: No results for input(s): LABPROT, INR in the last 72 hours.  Imaging: US Carotid Bilateral  04/01/2015  CLINICAL DATA:  Stroke EXAM: BILATERAL CAROTID DUPLEX ULTRASOUND TECHNIQUE: Pearline Cables scale  imaging, color Doppler and duplex ultrasound were performed of bilateral carotid and vertebral arteries in the neck. COMPARISON:  None. FINDINGS: Criteria: Quantification of carotid stenosis is based on velocity parameters that correlate the residual internal carotid diameter with NASCET-based stenosis levels, using the diameter of the distal internal carotid lumen as the denominator for stenosis measurement. The following velocity measurements were obtained: RIGHT ICA:  93 cm/sec CCA:  123456 cm/sec SYSTOLIC ICA/CCA RATIO:  0.8 DIASTOLIC ICA/CCA RATIO:  1.2 ECA:  281 cm/sec LEFT ICA:  100 cm/sec CCA:  [id number] cm/sec SYSTOLIC ICA/CCA RATIO:  0.7  DIASTOLIC ICA/CCA RATIO:  1.4 ECA:  147 cm/sec RIGHT CAROTID ARTERY: Moderate amount of calcified and noncalcified plaque throughout the right common carotid and internal carotid arteries. RIGHT VERTEBRAL ARTERY:  Patent with appropriate antegrade flow. LEFT CAROTID ARTERY: Moderate amount of calcified and noncalcified plaque throughout the left common carotid and internal carotid arteries. LEFT VERTEBRAL ARTERY:  Patent with appropriate antegrade flow. IMPRESSION: Moderate amount of calcified and noncalcified plaque throughout the bilateral common carotid and internal carotid arteries. Velocity measurements throughout the bilateral carotid arteries are within normal limits with no evidence of hemodynamically significant stenosis. Electronically Signed   By: Franki Cabot M.D.   On: 04/01/2015 17:02    Medications:  I have reviewed the patient's current medications. Scheduled: . amLODipine  5 mg Oral Daily  . aspirin  81 mg Oral Daily  . atorvastatin  20 mg Oral q1800  . cloNIDine  0.1 mg Oral BID  . clopidogrel  75 mg Oral Daily  . doxazosin  8 mg Oral QHS  . furosemide  40 mg Oral Daily  . heparin  5,000 Units Subcutaneous 3 times per day  . hydrALAZINE  100 mg Oral TID  . insulin aspart  0-9 Units Subcutaneous TID AC & HS  . insulin detemir  20 Units Subcutaneous Daily  . metoprolol  50 mg Oral BID  . QUEtiapine  50 mg Oral BID  . rOPINIRole  0.25 mg Oral TID  . sodium bicarbonate  650 mg Oral BID  . sodium chloride flush  3 mL Intravenous Q12H    Assessment/Plan: Patient with some improvement but continued bilateral ballismus.  Recommendations: 1.  Increase Seroquel to 50mg  BID 2.  Will continue to follow with you   LOS: 6 days   Alexis Goodell, MD Neurology [phone] 04/03/2015  9:52 AM

## 2015-04-03 NOTE — Progress Notes (Signed)
MD verbally ordered to give Xanax before MRA.

## 2015-04-03 NOTE — Progress Notes (Signed)
April 03, 2015  [facility] [place] [address]  To Whom It May Concern,  Please excuse Emmit Carradine from work on April 02, 2015, and April 03, 2015, as she was visiting her dad in the hospital. If you have any concerns or questions please feel free to contact us.  Thank you,      Runell Gess, RN, BSN  [phone]

## 2015-04-03 NOTE — Progress Notes (Signed)
Holding phone for pt nurse on lunch. Received call from MD to give pt one time Ativan order 1mg  IV for pt to tolerate MRI procedure. Shortly after, received call from MRI that they were able to get pt to tolerate procedure, Ativan no longer needed. Continue to assess.   Gave handoff report to pt nurse.

## 2015-04-03 NOTE — Progress Notes (Signed)
Physical Therapy Treatment Patient Details Name: Jason Aguilar MRN: [id number] DOB: Feb 17, 1935 Today's Date: 04/03/2015    History of Present Illness Jason Aguilar is a 80 y.o. male who presents with intermittent alteration of mental status. Patient came to ED with a complaint of intermittent confusion. He is accompanied by his wife at that time. His initial workup in the ED proved to be largely within normal limits. 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 However, as his wife was taken back home he became confused again and tried to open the car door and jump out per her report. She brought him back to the ED for reevaluation. Hospitalists were called for admission for evaluation of potential medical cause for his altered mental state.  Pt reports he has had 2-3 falls in the last 12 months.    PT Comments    Pt soundly sleeping upon arrival. Pt awakens with several attempts; agreeable to PT. Initiated bed exercises, but after ankle pumps, pt sits to edge of bed with some degree of difficulty and exaggerated RUE movements. Pt grabs coffee with RUE, but seems to be unaware in between drinks, as he lowers cup and and proceeds to release grasp and spill several times. Pt sits edge of bed in slumped fashion requiring Min A at times to correct and encourage upright posture. Pt appeared that he may just slump forward out of bed several times. Pt does converse and participate with limited exercises, but requires consistent cueing to stay on task. Pt also impulsively attempts to stand several times during session. Purposeful stand requires Min A initially increasing to Mod A to try and maintain upright posture; pt unable to maintain then abruptly sits wishing return to bed. Pt able to adjust self upward in bed with bed flat and falls back to sleep before therapist exits room. Current  discharge orders are for outpatient PT; pt will need to demonstrate consistency in functional mobility, or consider different plan post acute care discharge.   Follow Up Recommendations  Other (comment) (need consistency to continue with OPPT)     Equipment Recommendations       Recommendations for Other Services       Precautions / Restrictions Precautions Precautions: Fall Restrictions Weight Bearing Restrictions: No    Mobility  Bed Mobility Overal bed mobility: Modified Independent             General bed mobility comments: large involuntary movements of primarily RUE; use left to pull/push self to sitting. Poor sitting balance   Transfers Overall transfer level: Needs assistance Equipment used: None Transfers: Sit to/from Stand Sit to Stand: Min assist;Mod assist (Min A initially; increases to Mod to try and attain upright)         General transfer comment: Pt impulsively attempts standing several time, but unable. With Min A stands, but not to full upright and requires increased assit to Mod to attain more upright position. Unable to maintain full upright position today.  Ambulation/Gait             General Gait Details: Poor sit balance and unable to maintain full upright position greater than several seconds. Ambulation deferred   Stairs            Wheelchair Mobility    Modified Rankin (Stroke Patients Only)       Balance Overall balance assessment: Needs assistance Sitting-balance support: Single extremity  supported;No upper extremity supported Sitting balance-Leahy Scale: Poor     Standing balance support: No upper extremity supported Standing balance-Leahy Scale: Poor Standing balance comment: Increased assist to Mod A to attain full upright stand position and unable to maintain. Pt abruptly sits                     Cognition Arousal/Alertness: Lethargic Behavior During Therapy: Impulsive;Restless Overall Cognitive  Status: No family/caregiver present to determine baseline cognitive functioning Area of Impairment: Attention;Following commands;Safety/judgement;Awareness   Current Attention Level: Alternating Memory: Decreased short-term memory Following Commands: Follows one step commands inconsistently Safety/Judgement: Decreased awareness of safety;Decreased awareness of deficits Awareness:  (does not demonstrate awareness of tasks at hand)   General Comments: Lethargic and even wheh awakened more and sitting edge of bed difficulty focusing on one task; grabs coffee, but then forgets it is in his hand and spills    Exercises General Exercises - Lower Extremity Ankle Circles/Pumps: AROM;Both;20 reps;Supine Long Arc Quad: AROM;Both;Seated;15 reps (slumped forward posture; difficulty staying on task) Hip ABduction/ADduction:  (attempted; does not follow instructions) Hip Flexion/Marching: AROM;Both;20 reps;Seated (difficulty staying on task; requires heavy cueing/instructio)    General Comments        Pertinent Vitals/Pain Pain Assessment: No/denies pain    Home Living                      Prior Function            PT Goals (current goals can now be found in the care plan section) Progress towards PT goals: Not progressing toward goals - comment    Frequency  7X/week    PT Plan Other (comment) (need consistency in functional mobility to continue OPPT)    Co-evaluation             End of Session Equipment Utilized During Treatment: Gait belt Activity Tolerance: Patient limited by lethargy Patient left: in bed;with call bell/phone within reach;with bed alarm set     Time: [id number] PT Time Calculation (min) (ACUTE ONLY): 26 min  Charges:  $Therapeutic Exercise: 8-22 mins $Therapeutic Activity: 8-22 mins                    G CodesCharlaine Dalton, PTA 04/03/2015, 2:44 PM

## 2015-04-04 LAB — DIFFERENTIAL
BASOS ABS: 0.1 10*3/uL (ref 0–0.1)
EOS ABS: 0.4 10*3/uL (ref 0–0.7)
Eosinophils Relative: 4 %
Lymphocytes Relative: 15 %
Lymphs Abs: 1.3 10*3/uL (ref 1.0–3.6)
Monocytes Absolute: 0.6 10*3/uL (ref 0.2–1.0)
NEUTROS ABS: 6.3 10*3/uL (ref 1.4–6.5)

## 2015-04-04 LAB — RENAL FUNCTION PANEL
ALBUMIN: 3.2 g/dL — AB (ref 3.5–5.0)
ANION GAP: 15 (ref 5–15)
BUN: 71 mg/dL — AB (ref 6–20)
CALCIUM: 10 mg/dL (ref 8.9–10.3)
CO2: 25 mmol/L (ref 22–32)
Chloride: 94 mmol/L — ABNORMAL LOW (ref 101–111)
Creatinine, Ser: 6.89 mg/dL — ABNORMAL HIGH (ref 0.61–1.24)
GFR calc Af Amer: 8 mL/min — ABNORMAL LOW (ref >60)
GFR, EST NON AFRICAN AMERICAN: 7 mL/min — AB (ref >60)
Glucose, Bld: 270 mg/dL — ABNORMAL HIGH (ref 65–99)
PHOSPHORUS: 4.9 mg/dL — AB (ref 2.5–4.6)
POTASSIUM: 5.8 mmol/L — AB (ref 3.5–5.1)
SODIUM: 134 mmol/L — AB (ref 135–145)

## 2015-04-04 LAB — CBC
HEMATOCRIT: 29.7 % — AB (ref 40.0–52.0)
Hemoglobin: 9.8 g/dL — ABNORMAL LOW (ref 13.0–18.0)
MCH: 30.2 pg (ref 26.0–34.0)
MCHC: 33.1 g/dL (ref 32.0–36.0)
MCV: 91.2 fL (ref 80.0–100.0)
Platelets: 253 10*3/uL (ref 150–440)
RBC: 3.26 MIL/uL — ABNORMAL LOW (ref 4.40–5.90)
RDW: 14 % (ref 11.5–14.5)
WBC: 8.7 10*3/uL (ref 3.8–10.6)

## 2015-04-04 LAB — GLUCOSE, CAPILLARY
GLUCOSE-CAPILLARY: 135 mg/dL — AB (ref 65–99)
GLUCOSE-CAPILLARY: 334 mg/dL — AB (ref 65–99)
GLUCOSE-CAPILLARY: 76 mg/dL (ref 65–99)
Glucose-Capillary: 107 mg/dL — ABNORMAL HIGH (ref 65–99)

## 2015-04-04 MED ORDER — CARBIDOPA-LEVODOPA 25-100 MG PO TABS
2.0000 | ORAL_TABLET | Freq: Two times a day (BID) | ORAL | Status: DC
  Administered 2015-04-04 – 2015-04-05 (×2): 2 via ORAL
  Filled 2015-04-04 (×4): qty 2

## 2015-04-04 MED ORDER — INSULIN DETEMIR 100 UNIT/ML ~~LOC~~ SOLN
20.0000 [IU] | Freq: Every day | SUBCUTANEOUS | Status: DC
Start: 2015-04-04 — End: 2015-04-05
  Administered 2015-04-04: 20 [IU] via SUBCUTANEOUS
  Filled 2015-04-04 (×3): qty 0.2

## 2015-04-04 MED ORDER — QUETIAPINE FUMARATE 25 MG PO TABS
25.0000 mg | ORAL_TABLET | Freq: Two times a day (BID) | ORAL | Status: DC
  Administered 2015-04-04 – 2015-04-05 (×2): 25 mg via ORAL
  Filled 2015-04-04 (×2): qty 1

## 2015-04-04 NOTE — Progress Notes (Signed)
HD tx start 

## 2015-04-04 NOTE — Progress Notes (Signed)
[facility] NAME: Jason Aguilar    MR#:  [id number]  DATE OF BIRTH:  Jun 01, 1935  SUBJECTIVE:found to have acute bilateral basal ganglion infarct Is awake and oriented but continues to have involuntary movements of hands and legs. Sedated on high dose seroquel,.  CHIEF COMPLAINT:   Chief Complaint  Patient presents with  . Altered Mental Status       REVIEW OF SYSTEMS:  CONSTITUTIONAL: No fever, some fatigue or weakness.  EYES: No blurred or double vision.  EARS, NOSE, AND THROAT: No tinnitus or ear pain.  RESPIRATORY: No cough, shortness of breath, wheezing or hemoptysis.  CARDIOVASCULAR: No chest pain, orthopnea, edema.  GASTROINTESTINAL: No nausea, vomiting, diarrhea or abdominal pain.  GENITOURINARY: No dysuria, hematuria.  ENDOCRINE: No polyuria, nocturia,  HEMATOLOGY: No anemia, easy bruising or bleeding SKIN: No rash or lesion. MUSCULOSKELETAL: No joint pain or arthritis.   NEUROLOGIC: No tingling, numbness,positive for weakness.  involuntary movements of hands and legs PSYCHIATRY: No anxiety or depression.   ROS  DRUG ALLERGIES:   Allergies  Allergen Reactions  . Losartan Swelling  . Dristan Rash    VITALS:  Blood pressure 131/53, pulse 64, temperature 97.8 F (36.6 C), temperature source Oral, resp. rate 19, height 5\' 5"  (1.651 m), weight 63 kg (138 lb 14.2 oz), SpO2 98 %.  PHYSICAL EXAMINATION:  GENERAL:  80 y.o.-year-old patient lying in the bed with no acute distress.  EYES: Pupils equal, round, reactive to light and accommodation. No scleral icterus. Extraocular muscles intact.  HEENT: Head atraumatic, normocephalic. Oropharynx and nasopharynx clear.  NECK:  Supple, no jugular venous distention. No thyroid enlargement, no tenderness.  LUNGS: Normal breath sounds bilaterally, no wheezing, rales,rhonchi or crepitation. No use of accessory muscles of respiration.  CARDIOVASCULAR: S1, S2 normal. No  murmurs, rubs, or gallops.  ABDOMEN: Soft, nontender, nondistended. Bowel sounds present. No organomegaly or mass.  EXTREMITIES: No pedal edema, cyanosis, or clubbing.  NEUROLOGIC: Cranial nerves II through XII are intact. Muscle strength 5/5 in all extremities. Sensation intact. Gait not checked. Able to perform finger nose test fine. And continues to have involuntary movements of hands and legs. PSYCHIATRIC: The patient is alert and oriented x 2.  SKIN: No obvious rash, lesion, or ulcer.   Physical Exam LABORATORY PANEL:   CBC  Recent Labs Lab 04/01/15 0512  WBC 7.9  HGB 11.2*  HCT 33.2*  PLT 240   ------------------------------------------------------------------------------------------------------------------  Chemistries   Recent Labs Lab 03/28/15 1446 03/29/15 0534 04/01/15 0512  NA 135 135 132*  K 3.6 4.3 4.8  CL 102 103 96*  CO2 25 22 25   GLUCOSE 265* 232* 219*  BUN 13 23* 34*  CREATININE 2.67* 3.64*  3.63* 5.17*  CALCIUM 9.2 9.5 9.3  MG  --  1.6*  --   AST 26  --   --   ALT 30  --   --   ALKPHOS 103  --   --   BILITOT 0.7  --   --    ------------------------------------------------------------------------------------------------------------------  Cardiac Enzymes No results for input(s): TROPONINI in the last 168 hours. ------------------------------------------------------------------------------------------------------------------  RADIOLOGY:  Mr Virgel Paling Wo Contrast  04/03/2015  CLINICAL DATA:  80 year old male with bilateral basal ganglia infarcts. Altered mental status. Subsequent encounter. EXAM: MRA HEAD WITHOUT CONTRAST TECHNIQUE: Angiographic images of the Circle of Willis were obtained using MRA technique without intravenous contrast. COMPARISON:  03/30/2015 brain MR. FINDINGS: Right vertebral artery occluded at the  level of the foramen magnum. Mild narrowing and irregularity left vertebral artery. No significant stenosis of the basilar artery.  Mild to moderate narrowing and irregularity superior cerebellar artery greater on the left. Fetal type contribution to the right posterior cerebral artery. Mild to moderate narrowing and irregularity mid to distal posterior cerebral arteries. Nonvisualized posterior inferior cerebellar artery and anterior inferior cerebellar artery bilaterally. Marked tandem stenosis right internal carotid artery cavernous and precavernous segment. Mild to moderate narrowing supraclinoid segment with poststenotic dilation. Marked narrowing proximal right posterior communicating artery. Hypoplastic A1 segment right anterior cerebral artery with superimposed marked narrowing. Mild moderate narrowing distal M1 segment right middle cerebral artery. Moderate to marked narrowing cavernous segment left internal carotid artery with mild dilation supraclinoid aspect. Mild narrowing proximal A1 segment left anterior cerebral artery. Mild narrowing and irregularity A2 segment anterior cerebral artery bilaterally (supplied from the left). Mild to moderate narrowing left middle cerebral artery bifurcation. Moderate to marked narrowing middle cerebral artery branch vessels bilaterally with decrease number of visualized middle cerebral artery branch vessels on the right. Bulge proximal M1 segment left middle cerebral artery felt to be origin of a vessel rather than aneurysm. Exam is slightly motion degraded. IMPRESSION: Significant intracranial atherosclerotic type changes noted on this motion degraded examination as detailed above Electronically Signed   By: Genia Del M.D.   On: 04/03/2015 13:11    ASSESSMENT AND PLAN:   Principal Problem:   Altered mental state Active Problems:   Hypertension   Diabetes mellitus type II, uncontrolled (HCC)   COPD (chronic obstructive pulmonary disease) (HCC)   ESRD on dialysis (HCC)   Acute delirium   Basal ganglia infarction (HCC)  Altered mental state - due to Acute The basal ganglia infarct  bilaterally.; Mental status improved. Ultrasound of carotids did not show any hemodynamically significant stenosis. Echo showed EF more than 50%. MRA of the brain showed atherosclerotic disease.. Continue aspirin, Plavix, statins. Continue involuntary movements of hands and legs because of basal ganglia stroke. I explained to the family that this moment disorder is likely due to stroke,   Diabetes mellitus type II,'controlled   COPD (chronic obstructive pulmonary disease) ([place]) - not on home inhalers, will monitor and treat as needed   ESRD on dialysis [facility]) - nephrology consult for hemodialysis support. for hemodialysis Tuesday Thursday and Saturday [place];continue Seroquel decreased her dose of Seroquel secondary to sedation. Monitor on Sinemet today. Watch for any side effects today. Likely discharge tomorrow morning.  Discussed this with patient's daughter.   AAll the records are reviewed and case discussed with Care Management/Social Workerr. Management plans discussed with the patient, family and they are in agreement.  CODE STATUS: Full.  TOTAL TIME TAKING CARE OF THIS PATIENT: 25  minutes.   POSSIBLE D/C IN 1-2 DAYS, DEPENDING ON CLINICAL CONDITION.   [name] M.D on 04/04/2015   Between 7am to 6pm - Pager - [phone]  After 6pm go to [email or web address] - password [email or web address] [place] [facility]  Office  [phone]  CC: Primary care physician; Valera Castle, MD  Note: This dictation was prepared with Dragon dictation along with smaller phrase technology. Any transcriptional errors that result from this process are unintentional.

## 2015-04-04 NOTE — Progress Notes (Signed)
Pre-hd tx 

## 2015-04-04 NOTE — Progress Notes (Signed)
Follow-up. Ater receiving carbidopa levodopa this afternoon.  While sitting upright  (on side of bed) only pt Lower extremities were restless and exhibited uncontrolled movement. However, while lying down in bed (on back) upper and Lower extremities were fidgety, pt continued to turn side to side and move around, as he did this morning. Change not noticeable from before carbidopa-levodopa was taken till now. Pt does have better controlled movements sitting up. Continue to assess.

## 2015-04-04 NOTE — Progress Notes (Signed)
Post hd tx 

## 2015-04-04 NOTE — Progress Notes (Addendum)
PT Cancellation Note  Patient Details Name: Jason Aguilar MRN: [id number] DOB: 1935-06-05   Cancelled Treatment:    Reason Eval/Treat Not Completed: Patient at procedure or test/unavailable. Pt in hemodialysis this morning. Will attempt treatment this afternoon.    Charlaine Dalton, [place] 04/04/2015, 11:20 AM

## 2015-04-04 NOTE — Progress Notes (Signed)
Pt was lying still on bed, asleep. Upon awakening, pt became fidgety, then sat up to eat snacks provided by visitors. Gave Sinemet IR at 1539 per MD request, continue to assess.
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
## 2015-04-04 NOTE — Progress Notes (Signed)
Initial Nutrition Assessment     INTERVENTION:  Meals and snacks: Monitor intake. Recommend renal/carb modified diet    NUTRITION DIAGNOSIS:    (none at this time) related to   as evidenced by  .    GOAL:   Patient will meet greater than or equal to 90% of their needs    MONITOR:    (Energy intake, Electrolyte and renal profile)  REASON FOR ASSESSMENT:   LOS    ASSESSMENT:      Pt admitted with acute basal ganglia infaract  Past Medical History  Diagnosis Date  . Glaucoma     both eyes Drs. Arrie [place] and [place]  . Hematuria syndrome     s/p negative cystoscopy  . GERD (gastroesophageal reflux disease) 2003    EGD  . Allergy   . Hyperlipidemia   . Obstructive sleep apnea of adult     CPAP at 8 cm H20  . Noncompliance with medication regimen   . 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 by [facility]: low grade noninvasive  . Chronic kidney disease (CKD), stage III (moderate)     with proteinuria  . Glaucoma   . Hypertension   . Diabetes mellitus     Type 2  . Cataract due to secondary diabetes mellitus ([place])   . COPD (chronic obstructive pulmonary disease) ([place])     secondary to tobacco abuse  . History of tobacco abuse     60 pack year history; quit 1980    Current Nutrition: eating 100% of meals per I and O sheet. Pt in HD at this time  Food/Nutrition-Related History: MST WDL   Scheduled Medications:  . amLODipine  5 mg Oral Daily  . aspirin  81 mg Oral Daily  . atorvastatin  20 mg Oral q1800  . calcium acetate  1,334 mg Oral TID WC  . carbidopa-levodopa  2 tablet Oral BID  . cloNIDine  0.1 mg Oral BID  . clopidogrel  75 mg Oral Daily  . docusate sodium  100 mg Oral BID  . doxazosin  8 mg Oral QHS  . famotidine  10 mg Oral Daily  . furosemide  40 mg Oral Daily  . heparin  5,000 Units Subcutaneous 3 times per day  . hydrALAZINE  100 mg Oral TID  . insulin aspart  0-9 Units Subcutaneous TID AC & HS  . insulin detemir  25 Units  Subcutaneous Daily  . metoprolol  50 mg Oral BID  . QUEtiapine  25 mg Oral BID  . sodium bicarbonate  650 mg Oral BID  . sodium chloride flush  3 mL Intravenous Q12H       Electrolyte/Renal Profile and Glucose Profile:   Recent Labs Lab 03/28/15 1446 03/29/15 0534 04/01/15 0512  NA 135 135 132*  K 3.6 4.3 4.8  CL 102 103 96*  CO2 25 22 25   BUN 13 23* 34*  CREATININE 2.67* 3.64*  3.63* 5.17*  CALCIUM 9.2 9.5 9.3  MG  --  1.6*  --   PHOS  --  4.5 5.1*  GLUCOSE 265* 232* 219*   Protein Profile:  Recent Labs Lab 03/28/15 1446 04/01/15 0512  ALBUMIN 3.9 3.3*    Gastrointestinal Profile: Last BM:3/7     Weight Change: wt encounters reviewed  Wt Readings from Last 10 Encounters:  04/01/15 136 lb 11 oz (62 kg)  03/28/15 150 lb (68.04 kg)  03/03/15 143 lb (64.864 kg)  01/06/15 169 lb 8 oz (76.885  kg)  09/02/11 169 lb 8 oz (76.885 kg)  08/23/11 164 lb (74.39 kg)  07/04/11 161 lb (73.029 kg)  04/05/11 159 lb (72.122 kg)  03/08/11 158 lb (71.668 kg)  10/19/10 161 lb 8 oz (73.256 kg)      Diet Order:  Diet heart healthy/carb modified Room service appropriate?: Yes; Fluid consistency:: Thin  Skin:   reviewed   Height:   Ht Readings from Last 1 Encounters:  03/29/15 5\' 5"  (1.651 m)    Weight:   Wt Readings from Last 1 Encounters:  04/01/15 136 lb 11 oz (62 kg)    Ideal Body Weight:     BMI:  Body mass index is 22.75 kg/(m^2).  EDUCATION NEEDS:   No education needs identified at this time  LOW Care Level  Lenton Gendreau B. Zenia Resides, [place], Avis (pager) Weekend/On-Call pager [phone])

## 2015-04-05 LAB — GLUCOSE, CAPILLARY
GLUCOSE-CAPILLARY: 268 mg/dL — AB (ref 65–99)
Glucose-Capillary: 93 mg/dL (ref 65–99)

## 2015-04-05 MED ORDER — CLOPIDOGREL BISULFATE 75 MG PO TABS: 75.0000 mg | ORAL_TABLET | Freq: Every day | ORAL | Status: AC

## 2015-04-05 MED ORDER — INSULIN DETEMIR 100 UNIT/ML ~~LOC~~ SOLN: 20.0000 [IU] | Freq: Every day | SUBCUTANEOUS | Status: DC

## 2015-04-05 MED ORDER — CARBIDOPA-LEVODOPA 25-100 MG PO TABS: 2.0000 | ORAL_TABLET | Freq: Two times a day (BID) | ORAL | Status: DC

## 2015-04-05 MED ORDER — QUETIAPINE FUMARATE 25 MG PO TABS: 25.0000 mg | ORAL_TABLET | Freq: Two times a day (BID) | ORAL | Status: DC

## 2015-04-05 MED ORDER — ASPIRIN 81 MG PO CHEW: 81.0000 mg | CHEWABLE_TABLET | Freq: Every day | ORAL | Status: AC

## 2015-04-05 MED ORDER — INSULIN REGULAR HUMAN 100 UNIT/ML IJ SOLN: 2.0000 [IU] | Freq: Three times a day (TID) | INTRAMUSCULAR | Status: DC

## 2015-04-05 NOTE — Progress Notes (Signed)
04/05/2015  16:30  Mayan A Plate to be D/C'd Home per MD order.  Discussed prescriptions and follow up appointments with the patient. Prescriptions given to patient, medication list explained in detail. Pt verbalized understanding.    Medication List    STOP taking these medications        furosemide 40 MG tablet  Commonly known as:  LASIX      TAKE these medications        ALPRAZolam 0.25 MG tablet  Commonly known as:  XANAX  Take 0.25 mg by mouth at bedtime as needed for sleep.     amLODipine 5 MG tablet  Commonly known as:  NORVASC  Take 5 mg by mouth daily.     aspirin 81 MG chewable tablet  Chew 1 tablet (81 mg total) by mouth daily.     calcium acetate 667 MG capsule  Commonly known as:  PHOSLO  Take 2 capsules (1,334 mg total) by mouth 3 (three) times daily with meals.     carbidopa-levodopa 25-100 MG tablet  Commonly known as:  SINEMET IR  Take 2 tablets by mouth 2 (two) times daily.     cloNIDine 0.1 MG tablet  Commonly known as:  CATAPRES  Take 1 tablet (0.1 mg total) by mouth 2 (two) times daily.     clopidogrel 75 MG tablet  Commonly known as:  PLAVIX  Take 1 tablet (75 mg total) by mouth daily.     doxazosin 8 MG tablet  Commonly known as:  CARDURA  Take 8 mg by mouth at bedtime.     hydrALAZINE 100 MG tablet  Commonly known as:  APRESOLINE  Take 100 mg by mouth 3 (three) times daily.     insulin detemir 100 UNIT/ML injection  Commonly known as:  LEVEMIR  Inject 0.2 mLs (20 Units total) into the skin daily.     insulin regular 100 units/mL injection  Commonly known as:  NOVOLIN R  Inject 0.02 mLs (2 Units total) into the skin 3 (three) times daily before meals.     metoprolol 50 MG tablet  Commonly known as:  LOPRESSOR  Take 50 mg by mouth 2 (two) times daily.     QUEtiapine 25 MG tablet  Commonly known as:  SEROQUEL  Take 1 tablet (25 mg total) by mouth 2 (two) times daily.     simvastatin 40 MG tablet  Commonly known as:  ZOCOR  Take  40 mg by mouth at bedtime.     sodium bicarbonate 650 MG tablet  Take 650 mg by mouth 2 (two) times daily.        Filed Vitals:   04/04/15 2102 04/04/15 2144  BP: 165/52 120/69  Pulse: 84 84  Temp:  98.2 F (36.8 C)  Resp:  20    Skin clean, dry and intact without evidence of skin break down, no evidence of skin tears noted. IV catheter discontinued intact. Site without signs and symptoms of complications. Dressing and pressure applied. Pt denies pain at this time. No complaints noted.  An After Visit Summary was printed and given to the patient. Patient escorted via [place], and D/C home via private auto.  Dola Argyle

## 2015-04-05 NOTE — Progress Notes (Signed)
[facility] [place] NAME: Jason Aguilar    MR#:  [id number]  DATE OF BIRTH:  [birth year]  SUBJECTIVE:found to have acute bilateral basal ganglion infarct Is awake and oriented but continues to have involuntary movements of hands and legs.but improving,  CHIEF COMPLAINT:   Chief Complaint  Patient presents with  . Altered Mental Status       REVIEW OF SYSTEMS:  CONSTITUTIONAL: No fever, some fatigue or weakness.  EYES: No blurred or double vision.  EARS, NOSE, AND THROAT: No tinnitus or ear pain.  RESPIRATORY: No cough, shortness of breath, wheezing or hemoptysis.  CARDIOVASCULAR: No chest pain, orthopnea, edema.  GASTROINTESTINAL: No nausea, vomiting, diarrhea or abdominal pain.  GENITOURINARY: No dysuria, hematuria.  ENDOCRINE: No polyuria, nocturia,  HEMATOLOGY: No anemia, easy bruising or bleeding SKIN: No rash or lesion. MUSCULOSKELETAL: No joint pain or arthritis.   NEUROLOGIC: No tingling, numbness,positive for weakness.  involuntary movements of hands and legs PSYCHIATRY: No anxiety or depression.   ROS  DRUG ALLERGIES:   Allergies  Allergen Reactions  . Losartan Swelling  . Dristan Rash    VITALS:  Blood pressure 120/69, pulse 84, temperature 98.2 F (36.8 C), temperature source Oral, resp. rate 20, height 5\' 5"  (1.651 m), weight 63 kg (138 lb 14.2 oz), SpO2 93 %.  PHYSICAL EXAMINATION:  GENERAL:  80 y.o.-year-old patient lying in the bed with no acute distress.  EYES: Pupils equal, round, reactive to light and accommodation. No scleral icterus. Extraocular muscles intact.  HEENT: Head atraumatic, normocephalic. Oropharynx and nasopharynx clear.  NECK:  Supple, no jugular venous distention. No thyroid enlargement, no tenderness.  LUNGS: Normal breath sounds bilaterally, no wheezing, rales,rhonchi or crepitation. No use of accessory muscles of respiration.  CARDIOVASCULAR: S1, S2 normal. No murmurs, rubs, or  gallops.  ABDOMEN: Soft, nontender, nondistended. Bowel sounds present. No organomegaly or mass.  EXTREMITIES: No pedal edema, cyanosis, or clubbing.  NEUROLOGIC: Cranial nerves II through XII are intact. Muscle strength 5/5 in all extremities. Sensation intact. Gait not checked. Able to perform finger nose test fine. And continues to have involuntary movements of hands and legs. PSYCHIATRIC: The patient is alert and oriented x 2.  SKIN: No obvious rash, lesion, or ulcer.   Physical Exam LABORATORY PANEL:   CBC  Recent Labs Lab 04/04/15 1029  WBC 8.7  HGB 9.8*  HCT 29.7*  PLT 253   ------------------------------------------------------------------------------------------------------------------  Chemistries   Recent Labs Lab 04/04/15 1029  NA 134*  K 5.8*  CL 94*  CO2 25  GLUCOSE 270*  BUN 71*  CREATININE 6.89*  CALCIUM 10.0   ------------------------------------------------------------------------------------------------------------------  Cardiac Enzymes No results for input(s): TROPONINI in the last 168 hours. ------------------------------------------------------------------------------------------------------------------  RADIOLOGY:  No results found.  ASSESSMENT AND PLAN:   Principal Problem:   Altered mental state Active Problems:   Hypertension   Diabetes mellitus type II, uncontrolled (HCC)   COPD (chronic obstructive pulmonary disease) (HCC)   ESRD on dialysis ([place])   Acute delirium   Basal ganglia infarction (HCC)  Altered mental state - due to Acute The basal ganglia infarct bilaterally.; Mental status improved. Ultrasound of carotids did not show any hemodynamically significant stenosis. Echo showed EF more than 50%. MRA of the brain showed atherosclerotic disease.. Continue aspirin, Plavix, statins. Continue involuntary movements of hands and legs because of basal ganglia stroke. I explained to the family that this moment disorder is likely due  to stroke,   Diabetes mellitus  type II,'controlled   COPD (chronic obstructive pulmonary disease) (HCC) - not on home inhalers, will monitor and treat as needed   ESRD on dialysis [facility]) - nephrology consult for hemodialysis support. for hemodialysis Tuesday Thursday and Saturday [place];continue Seroquel decreased her dose of Seroquel secondary to sedation.  continue sinemet,discharge home today Discussed this with patient's daughter.  yesterday  AAll the records are reviewed and case discussed with Care Management/Social Workerr. Management plans discussed with the patient, family and they are in agreement.  CODE STATUS: Full.  TOTAL TIME TAKING CARE OF THIS PATIENT: 25  minutes.  Discharged home today   Rossana Molchan M.D on 04/05/2015   Between 7am to 6pm - Pager - [phone]  After 6pm go to [email or web address] - password [email or web address]  Office  [phone]  CC: Primary care physician; Valera Castle, MD  Note: This dictation was prepared with Dragon dictation along with smaller phrase technology. Any transcriptional errors that result from this process are unintentional.
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
## 2015-04-05 NOTE — Progress Notes (Signed)
Inpatient Diabetes Program Recommendations  AACE/ADA: New Consensus Statement on Inpatient Glycemic Control (2015)  Target Ranges:  Prepandial:   less than 140 mg/dL      Peak postprandial:   less than 180 mg/dL (1-2 hours)      Critically ill patients:  140 - 180 mg/dL  Results for LORANZA, LASKER (MRN [id number]) as of 04/05/2015 07:29  Ref. Range 04/04/2015 07:51 04/04/2015 16:31 04/04/2015 22:04  Glucose-Capillary Latest Ref Range: 65-99 mg/dL 107 (H) 334 (H) 135 (H)   Review of Glycemic Control  Current orders for Inpatient glycemic control: Levemir 20 units daily, Novolog 0-9 units ACHS  Inpatient Diabetes Program Recommendations: Insulin - Meal Coverage: Please consider ordering Novolog 2 units TID with meals for meal coverage if patient eats at least 50% of meals (in addition to Novolog correction scale).   Thanks, Barnie Alderman, RN, MSN, CDE Diabetes Coordinator Inpatient Diabetes Program [phone] (Team Pager from [place] to [place]) [phone] ([facility] office) [phone] [facility] office) [phone] [facility] office)
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
## 2015-04-05 NOTE — Care Management (Signed)
Patient to discharge to home today.  No RNCM needs identified.  PT has recommending outpatient PT.  PCS list to be provided to family at time of discharge.  RNCM signing off

## 2015-04-06 ENCOUNTER — Encounter: Payer: Self-pay | Admitting: Anesthesiology

## 2015-04-06 NOTE — Pre-Procedure Instructions (Addendum)
CALLED AND ASKED DR Washoe AT DISCHARGE NOTE. PATIENT DISCHARGED 04/04/17. STATES MUST HAVE CLEARANCE NOTE FROM DR OLMEDO UNLESS DR SCHNIERS OFFICE AWARE HE WAS  CLEARED. SPOKE Ogle

## 2015-04-06 NOTE — Discharge Summary (Signed)
Jason Aguilar, is a 80 y.o. male  DOB May 25, 1935  MRN [id number].  Admission date:  03/28/2015  Admitting Physician  Lance Coon, MD  Discharge Date:  04/05/2015   Primary MD  Valera Castle, MD  Recommendations for primary care physician for things to follow:  Follow-up with primary doctor in one week Follow up with  [facility] at the first available appointment.   Admission Diagnosis  Agitation [R45.1] Altered mental status, unspecified altered mental status type [R41.82]   Discharge Diagnosis  Agitation [R45.1] Altered mental status, unspecified altered mental status type [R41.82]    Principal Problem:   Altered mental state Active Problems:   Hypertension   Diabetes mellitus type II, uncontrolled (HCC)   COPD (chronic obstructive pulmonary disease) (HCC)   ESRD on dialysis ([place])   Acute delirium   Basal ganglia infarction [facility])      Past Medical History  Diagnosis Date  . Glaucoma     both eyes Drs. Arrie [place] and [place]  . Hematuria syndrome     s/p negative cystoscopy  . GERD (gastroesophageal reflux disease) 2003    EGD  . Allergy   . Hyperlipidemia   . Obstructive sleep apnea of adult     CPAP at 8 cm H20  . Noncompliance with medication regimen   . 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 by [facility]: low grade noninvasive  . Chronic kidney disease (CKD), stage III (moderate)     with proteinuria  . Glaucoma   . Hypertension   . Diabetes mellitus     Type 2  . Cataract due to secondary diabetes mellitus ([place])   . COPD (chronic obstructive pulmonary disease) ([place])     secondary to tobacco abuse  . History of tobacco abuse     60 pack year history; quit 1980    Past Surgical History  Procedure Laterality Date  . Elbow surgery    . Transurethral resection of bladder tumor   Mar 13 2011    [facility]  . Peripheral vascular catheterization N/A 01/03/2015    Procedure: Dialysis/Perma Catheter Insertion;  Surgeon: Katha Cabal, MD;  Location: [place] [facility];  Service: Cardiovascular;  Laterality: N/A;  . Skin graft    . Cataract extraction         History of present illness and  Hospital Course:     Kindly see H&P for history of present illness and admission details, please review complete Labs, Consult reports and Test reports for all details in brief  HPI  from the history and physical done on the day of admission  80 year old male patient with history of hypertension, ESRD on hemodialysis brought in by family secondary to intermittent altered mental status.  Hospital Course  1 altered mental status: Secondary to acute  bilateral basal ganglion infarct. MRI of the brain confirmed this. Patient  seen by neurology. Started on aspirin, Plavix, high intensity statins. Patient ultrasound of carotids showed bilateral carotid disease . Echo showed  EF more than 55%. Patient had MRA of the brain show atherosclerotic disease. Neurology is following the patient.   Involuntary movements of hands and legs secondary to basal ganglia infarct: To daughter that he has involuntary movements of hands and legs due to the stroke . Patient started on that Sinemet, Seroquel. To ESRD on hemodialysis Tuesday Thursday and Saturday. #3 COPD: No wheezing. Hypertension: Controlled. Patient is on amlodipine, metoprolol, hydralazine, clonidine Diabetes mellitus type 2; seen by inpatient diabetic  nurse: Patient is on Levemir 20 units, NovoLog 2units 3 times a day.  Discharge Condition: stable   Follow UP  Follow-up Information    Follow up with Valera Castle, MD. Go on 04/12/2015.   Specialty:  Family Medicine   Why:  @11 :40am   Contact information:   [place] [id number] [phone]       Follow up with [facility] In 1 week.   Why:  NEEDS  REFERRAL FROM PRIMARY DOCTOR. PRIMARY OFFICE AWARE        Discharge Instructions  and  Discharge Medications        Medication List    STOP taking these medications        furosemide 40 MG tablet  Commonly known as:  LASIX      TAKE these medications        ALPRAZolam 0.25 MG tablet  Commonly known as:  XANAX  Take 0.25 mg by mouth at bedtime as needed for sleep.     amLODipine 5 MG tablet  Commonly known as:  NORVASC  Take 5 mg by mouth daily.     aspirin 81 MG chewable tablet  Chew 1 tablet (81 mg total) by mouth daily.     calcium acetate 667 MG capsule  Commonly known as:  PHOSLO  Take 2 capsules (1,334 mg total) by mouth 3 (three) times daily with meals.     carbidopa-levodopa 25-100 MG tablet  Commonly known as:  SINEMET IR  Take 2 tablets by mouth 2 (two) times daily.     cloNIDine 0.1 MG tablet  Commonly known as:  CATAPRES  Take 1 tablet (0.1 mg total) by mouth 2 (two) times daily.     clopidogrel 75 MG tablet  Commonly known as:  PLAVIX  Take 1 tablet (75 mg total) by mouth daily.     doxazosin 8 MG tablet  Commonly known as:  CARDURA  Take 8 mg by mouth at bedtime.     hydrALAZINE 100 MG tablet  Commonly known as:  APRESOLINE  Take 100 mg by mouth 3 (three) times daily.     insulin detemir 100 UNIT/ML injection  Commonly known as:  LEVEMIR  Inject 0.2 mLs (20 Units total) into the skin daily.     insulin regular 100 units/mL injection  Commonly known as:  NOVOLIN R  Inject 0.02 mLs (2 Units total) into the skin 3 (three) times daily before meals.     metoprolol 50 MG tablet  Commonly known as:  LOPRESSOR  Take 50 mg by mouth 2 (two) times daily.     QUEtiapine 25 MG tablet  Commonly known as:  SEROQUEL  Take 1 tablet (25 mg total) by mouth 2 (two) times daily.     simvastatin 40 MG tablet  Commonly known as:  ZOCOR  Take 40 mg by mouth at bedtime.     sodium bicarbonate 650 MG tablet  Take 650 mg by mouth 2 (two) times daily.           Diet and Activity recommendation: See Discharge Instructions above   Consults obtained - neuro nephro   Major procedures and Radiology Reports - PLEASE review detailed and final reports for all details, in brief -      Dg Chest 2 View  03/30/2015  CLINICAL DATA:  Confusion and disorientation, type 2 diabetic, COPD. Previous smoker. EXAM: CHEST  2 VIEW COMPARISON:  Chest x-ray dated 01/02/2015. FINDINGS: Heart size is upper  normal, stable. Overall cardiomediastinal silhouette is stable in size and configuration. Atherosclerotic calcifications again noted at the aortic arch. Right- sided dialysis catheter in place with tip well-positioned over the lower SVC. Mildly coarsened interstitial markings again noted bilaterally suggesting some degree of interstitial fibrosis. Lungs otherwise clear. No evidence of pneumonia. No pleural effusion. No pneumothorax seen. Mild degenerative spurring noted within the slightly scoliotic thoracic spine. No acute osseous abnormality. IMPRESSION: No acute findings. No evidence of pneumonia. Dialysis catheter appears adequately positioned. Chronic/incidental findings detailed above. Electronically Signed   By: Franki Cabot M.D.   On: 03/30/2015 08:17   Ct Head Wo Contrast  03/28/2015  CLINICAL DATA:  80 year old male with history of altered mental status and slurred speech. EXAM: CT HEAD WITHOUT CONTRAST TECHNIQUE: Contiguous axial images were obtained from the base of the skull through the vertex without intravenous contrast. COMPARISON:  No priors. FINDINGS: Mild cerebral and cerebellar atrophy. Physiologic calcifications in the basal ganglia bilaterally. Patchy and confluent areas of decreased attenuation are noted throughout the deep and periventricular white matter of the cerebral hemispheres bilaterally, compatible with chronic microvascular ischemic disease. No acute intracranial abnormalities. Specifically, no evidence of acute intracranial hemorrhage,  no definite findings of acute/subacute cerebral ischemia, no mass, mass effect, hydrocephalus or abnormal intra or extra-axial fluid collections. Visualized paranasal sinuses and mastoids are well pneumatized. No acute displaced skull fractures are identified. IMPRESSION: 1. No acute intracranial abnormalities. 2. Mild cerebral and cerebellar atrophy with extensive chronic microvascular ischemic changes throughout cerebral white matter, as above. Electronically Signed   By: Vinnie Langton M.D.   On: 03/28/2015 14:29   Mr Jodene Nam Head Wo Contrast  04/03/2015  CLINICAL DATA:  80 year old male with bilateral basal ganglia infarcts. Altered mental status. Subsequent encounter. EXAM: MRA HEAD WITHOUT CONTRAST TECHNIQUE: Angiographic images of the Circle of Willis were obtained using MRA technique without intravenous contrast. COMPARISON:  03/30/2015 brain MR. FINDINGS: Right vertebral artery occluded at the level of the foramen magnum. Mild narrowing and irregularity left vertebral artery. No significant stenosis of the basilar artery. Mild to moderate narrowing and irregularity superior cerebellar artery greater on the left. Fetal type contribution to the right posterior cerebral artery. Mild to moderate narrowing and irregularity mid to distal posterior cerebral arteries. Nonvisualized posterior inferior cerebellar artery and anterior inferior cerebellar artery bilaterally. Marked tandem stenosis right internal carotid artery cavernous and precavernous segment. Mild to moderate narrowing supraclinoid segment with poststenotic dilation. Marked narrowing proximal right posterior communicating artery. Hypoplastic A1 segment right anterior cerebral artery with superimposed marked narrowing. Mild moderate narrowing distal M1 segment right middle cerebral artery. Moderate to marked narrowing cavernous segment left internal carotid artery with mild dilation supraclinoid aspect. Mild narrowing proximal A1 segment left anterior  cerebral artery. Mild narrowing and irregularity A2 segment anterior cerebral artery bilaterally (supplied from the left). Mild to moderate narrowing left middle cerebral artery bifurcation. Moderate to marked narrowing middle cerebral artery branch vessels bilaterally with decrease number of visualized middle cerebral artery branch vessels on the right. Bulge proximal M1 segment left middle cerebral artery felt to be origin of a vessel rather than aneurysm. Exam is slightly motion degraded. IMPRESSION: Significant intracranial atherosclerotic type changes noted on this motion degraded examination as detailed above Electronically Signed   By: Genia Del M.D.   On: 04/03/2015 13:11   Mr Brain Wo Contrast  03/30/2015  CLINICAL DATA:  Intermittent alteration of mental status. Involuntary movements of arms and legs. EXAM: MRI HEAD WITHOUT CONTRAST TECHNIQUE: Multiplanar,  multiecho pulse sequences of the brain and surrounding structures were obtained without intravenous contrast. COMPARISON:  CT head 03/28/2015. FINDINGS: BILATERAL punctate areas of basal ganglia restricted diffusion, greater on the RIGHT, representing acute lacunar infarction. Hypoperfusion event or shower of emboli could be responsible. No cerebellar or brainstem lesions. No visible hemorrhage, mass lesion, or extra-axial fluid. Generalized atrophy. Hydrocephalus ex vacuo. Extensive white matter disease. Cannot assess for chronic hemorrhage due to motion. No flow void in the RIGHT vertebral, uncertain duration. No midline abnormality. Extracranial soft tissues grossly unremarkable. Compared with prior CT, no abnormality is detectable. IMPRESSION: BILATERAL acute basal ganglia lacunar infarctions, greater on the RIGHT. Atrophy and small vessel disease. No flow in the RIGHT vertebral, uncertain duration. Significance doubtful to the acute event, as there is no cerebellar ischemia. Electronically Signed   By: Staci Righter M.D.   On: 03/30/2015 14:47    US Carotid Bilateral  04/01/2015  CLINICAL DATA:  Stroke EXAM: BILATERAL CAROTID DUPLEX ULTRASOUND TECHNIQUE: Pearline Cables scale imaging, color Doppler and duplex ultrasound were performed of bilateral carotid and vertebral arteries in the neck. COMPARISON:  None. FINDINGS: Criteria: Quantification of carotid stenosis is based on velocity parameters that correlate the residual internal carotid diameter with NASCET-based stenosis levels, using the diameter of the distal internal carotid lumen as the denominator for stenosis measurement. The following velocity measurements were obtained: RIGHT ICA:  93 cm/sec CCA:  123456 cm/sec SYSTOLIC ICA/CCA RATIO:  0.8 DIASTOLIC ICA/CCA RATIO:  1.2 ECA:  281 cm/sec LEFT ICA:  100 cm/sec CCA:  [id number] cm/sec SYSTOLIC ICA/CCA RATIO:  0.7 DIASTOLIC ICA/CCA RATIO:  1.4 ECA:  147 cm/sec RIGHT CAROTID ARTERY: Moderate amount of calcified and noncalcified plaque throughout the right common carotid and internal carotid arteries. RIGHT VERTEBRAL ARTERY:  Patent with appropriate antegrade flow. LEFT CAROTID ARTERY: Moderate amount of calcified and noncalcified plaque throughout the left common carotid and internal carotid arteries. LEFT VERTEBRAL ARTERY:  Patent with appropriate antegrade flow. IMPRESSION: Moderate amount of calcified and noncalcified plaque throughout the bilateral common carotid and internal carotid arteries. Velocity measurements throughout the bilateral carotid arteries are within normal limits with no evidence of hemodynamically significant stenosis. Electronically Signed   By: Franki Cabot M.D.   On: 04/01/2015 17:02    Micro Results     No results found for this or any previous visit (from the past 240 hour(s)).     Today   Subjective:   Nacho Dice today has no headache,no chest abdominal pain,no new weakness tingling or numbness, feels much better wants to go home today.   Objective:   Blood pressure 120/69, pulse 84, temperature 98.2 F (36.8 C),  temperature source Oral, resp. rate 20, height 5\' 5"  (1.651 m), weight 63 kg (138 lb 14.2 oz), SpO2 93 %.  No intake or output data in the 24 hours ending 04/06/15 1417  Exam Awake Alert, Oriented x 3, No new F.N deficits, Normal affect [place].AT,PERRAL Supple Neck,No JVD, No cervical lymphadenopathy appriciated.  Symmetrical Chest wall movement, Good air movement bilaterally, CTAB RRR,No Gallops,Rubs or new Murmurs, No Parasternal Heave +ve B.Sounds, Abd Soft, Non tender, No organomegaly appriciated, No rebound -guarding or rigidity. No Cyanosis, Clubbing or edema, No new Rash or bruise  Data Review   CBC w Diff:  Lab Results  Component Value Date   WBC 8.7 04/04/2015   WBC 6.7 09/17/2013   HGB 9.8* 04/04/2015   HGB 13.3 09/17/2013   HCT 29.7* 04/04/2015   HCT 40.8 09/17/2013  PLT 253 04/04/2015   PLT 185 09/17/2013   LYMPHOPCT 15% 04/04/2015   LYMPHOPCT 3.4 07/25/2013   MONOPCT 7% 04/04/2015   MONOPCT 3.6 07/25/2013   EOSPCT 4% 04/04/2015   EOSPCT 0.0 07/25/2013   BASOPCT 1% 04/04/2015   BASOPCT 0.0 07/25/2013    CMP:  Lab Results  Component Value Date   NA 134* 04/04/2015   NA 142 09/17/2013   K 5.8* 04/04/2015   K 4.4 09/17/2013   CL 94* 04/04/2015   CL 114* 09/17/2013   CO2 25 04/04/2015   CO2 19* 09/17/2013   BUN 71* 04/04/2015   BUN 23* 09/17/2013   CREATININE 6.89* 04/04/2015   CREATININE 2.09* 09/17/2013   CREATININE 1.77* 10/19/2010   PROT 6.6 03/28/2015   PROT 6.8 07/23/2013   ALBUMIN 3.2* 04/04/2015   ALBUMIN 3.6 07/23/2013   BILITOT 0.7 03/28/2015   BILITOT 0.7 07/23/2013   ALKPHOS 103 03/28/2015   ALKPHOS 99 07/23/2013   AST 26 03/28/2015   AST 26 07/23/2013   ALT 30 03/28/2015   ALT 20 07/23/2013  .   Total Time in preparing paper work, data evaluation and todays exam - 60 minutes  Elga Santy M.D on 04/05/2015 at 2:17 PM    Note: This dictation was prepared with Dragon dictation along with smaller phrase technology. Any  transcriptional errors that result from this process are unintentional.

## 2015-04-07 ENCOUNTER — Encounter: Admission: RE | Payer: Self-pay | Source: Ambulatory Visit

## 2015-04-07 ENCOUNTER — Ambulatory Visit: Admission: RE | Admit: 2015-04-07 | Payer: Medicare Other | Source: Ambulatory Visit | Admitting: Vascular Surgery

## 2015-04-07 SURGERY — INSERTION OF ARTERIOVENOUS (AV) GORE-TEX GRAFT ARM
Anesthesia: General | Laterality: Left

## 2015-04-13 ENCOUNTER — Emergency Department: Payer: Medicare Other

## 2015-04-13 ENCOUNTER — Inpatient Hospital Stay
Admission: EM | Admit: 2015-04-13 | Discharge: 2015-04-21 | DRG: 064 | Disposition: A | Discharge status: Skilled Nursing Facility | Payer: Medicare Other | Attending: Internal Medicine | Admitting: Internal Medicine

## 2015-04-13 ENCOUNTER — Encounter: Payer: Self-pay | Admitting: Emergency Medicine

## 2015-04-13 DIAGNOSIS — Z8673 Personal history of transient ischemic attack (TIA), and cerebral infarction without residual deficits: Secondary | ICD-10-CM

## 2015-04-13 DIAGNOSIS — D631 Anemia in chronic kidney disease: Secondary | ICD-10-CM | POA: Diagnosis present

## 2015-04-13 DIAGNOSIS — Z794 Long term (current) use of insulin: Secondary | ICD-10-CM

## 2015-04-13 DIAGNOSIS — N186 End stage renal disease: Secondary | ICD-10-CM | POA: Diagnosis present

## 2015-04-13 DIAGNOSIS — Z79899 Other long term (current) drug therapy: Secondary | ICD-10-CM

## 2015-04-13 DIAGNOSIS — Z8551 Personal history of malignant neoplasm of bladder: Secondary | ICD-10-CM

## 2015-04-13 DIAGNOSIS — R4182 Altered mental status, unspecified: Secondary | ICD-10-CM

## 2015-04-13 DIAGNOSIS — E1122 Type 2 diabetes mellitus with diabetic chronic kidney disease: Secondary | ICD-10-CM | POA: Diagnosis present

## 2015-04-13 DIAGNOSIS — G20 Parkinson's disease: Secondary | ICD-10-CM | POA: Diagnosis present

## 2015-04-13 DIAGNOSIS — Z9849 Cataract extraction status, unspecified eye: Secondary | ICD-10-CM

## 2015-04-13 DIAGNOSIS — N2581 Secondary hyperparathyroidism of renal origin: Secondary | ICD-10-CM | POA: Diagnosis present

## 2015-04-13 DIAGNOSIS — Z992 Dependence on renal dialysis: Secondary | ICD-10-CM

## 2015-04-13 DIAGNOSIS — Z87891 Personal history of nicotine dependence: Secondary | ICD-10-CM

## 2015-04-13 DIAGNOSIS — Z9889 Other specified postprocedural states: Secondary | ICD-10-CM

## 2015-04-13 DIAGNOSIS — Z8249 Family history of ischemic heart disease and other diseases of the circulatory system: Secondary | ICD-10-CM

## 2015-04-13 DIAGNOSIS — I639 Cerebral infarction, unspecified: Secondary | ICD-10-CM | POA: Diagnosis not present

## 2015-04-13 DIAGNOSIS — Z888 Allergy status to other drugs, medicaments and biological substances status: Secondary | ICD-10-CM

## 2015-04-13 DIAGNOSIS — I120 Hypertensive chronic kidney disease with stage 5 chronic kidney disease or end stage renal disease: Secondary | ICD-10-CM | POA: Diagnosis present

## 2015-04-13 DIAGNOSIS — H409 Unspecified glaucoma: Secondary | ICD-10-CM | POA: Diagnosis present

## 2015-04-13 DIAGNOSIS — G9341 Metabolic encephalopathy: Secondary | ICD-10-CM | POA: Diagnosis present

## 2015-04-13 DIAGNOSIS — G255 Other chorea: Secondary | ICD-10-CM | POA: Diagnosis present

## 2015-04-13 DIAGNOSIS — Z833 Family history of diabetes mellitus: Secondary | ICD-10-CM

## 2015-04-13 DIAGNOSIS — I4891 Unspecified atrial fibrillation: Secondary | ICD-10-CM | POA: Diagnosis present

## 2015-04-13 DIAGNOSIS — G4733 Obstructive sleep apnea (adult) (pediatric): Secondary | ICD-10-CM | POA: Diagnosis present

## 2015-04-13 DIAGNOSIS — K219 Gastro-esophageal reflux disease without esophagitis: Secondary | ICD-10-CM | POA: Diagnosis present

## 2015-04-13 DIAGNOSIS — J449 Chronic obstructive pulmonary disease, unspecified: Secondary | ICD-10-CM | POA: Diagnosis present

## 2015-04-13 LAB — CBC WITH DIFFERENTIAL/PLATELET
BASOS PCT: 0 %
Basophils Absolute: 0 10*3/uL (ref 0–0.1)
EOS ABS: 0 10*3/uL (ref 0–0.7)
Eosinophils Relative: 0 %
HEMATOCRIT: 30.6 % — AB (ref 40.0–52.0)
HEMOGLOBIN: 10.1 g/dL — AB (ref 13.0–18.0)
Lymphocytes Relative: 10 %
Lymphs Abs: 1.1 10*3/uL (ref 1.0–3.6)
MCH: 30.2 pg (ref 26.0–34.0)
MCHC: 33.1 g/dL (ref 32.0–36.0)
MCV: 91.4 fL (ref 80.0–100.0)
MONOS PCT: 4 %
Monocytes Absolute: 0.4 10*3/uL (ref 0.2–1.0)
NEUTROS PCT: 86 %
Neutro Abs: 9.9 10*3/uL — ABNORMAL HIGH (ref 1.4–6.5)
Platelets: 234 10*3/uL (ref 150–440)
RBC: 3.35 MIL/uL — ABNORMAL LOW (ref 4.40–5.90)
RDW: 14 % (ref 11.5–14.5)
WBC: 11.5 10*3/uL — AB (ref 3.8–10.6)

## 2015-04-13 LAB — BASIC METABOLIC PANEL
ANION GAP: 7 (ref 5–15)
BUN: 10 mg/dL (ref 6–20)
CALCIUM: 9.2 mg/dL (ref 8.9–10.3)
CO2: 26 mmol/L (ref 22–32)
CREATININE: 2.04 mg/dL — AB (ref 0.61–1.24)
Chloride: 102 mmol/L (ref 101–111)
GFR, EST AFRICAN AMERICAN: 34 mL/min — AB (ref >60)
GFR, EST NON AFRICAN AMERICAN: 30 mL/min — AB (ref >60)
Glucose, Bld: 188 mg/dL — ABNORMAL HIGH (ref 65–99)
Potassium: 3.5 mmol/L (ref 3.5–5.1)
SODIUM: 135 mmol/L (ref 135–145)

## 2015-04-13 LAB — URINALYSIS COMPLETE WITH MICROSCOPIC (ARMC ONLY)
Bilirubin Urine: NEGATIVE
HGB URINE DIPSTICK: NEGATIVE
NITRITE: NEGATIVE
Protein, ur: >500 mg/dL — AB
SPECIFIC GRAVITY, URINE: 1.012 (ref 1.005–1.030)
pH: 6 (ref 5.0–8.0)

## 2015-04-13 LAB — TROPONIN I: Troponin I: <0.03 ng/mL (ref <0.031)

## 2015-04-13 MED ORDER — INSULIN ASPART 100 UNIT/ML ~~LOC~~ SOLN
0.0000 [IU] | Freq: Three times a day (TID) | SUBCUTANEOUS | Status: DC
Start: 2015-04-14 — End: 2015-04-21
  Administered 2015-04-14: 3 [IU] via SUBCUTANEOUS
  Administered 2015-04-14 – 2015-04-15 (×3): 1 [IU] via SUBCUTANEOUS
  Administered 2015-04-16: 2 [IU] via SUBCUTANEOUS
  Administered 2015-04-16: 1 [IU] via SUBCUTANEOUS
  Administered 2015-04-16: 2 [IU] via SUBCUTANEOUS
  Administered 2015-04-17 (×2): 1 [IU] via SUBCUTANEOUS
  Administered 2015-04-17: 2 [IU] via SUBCUTANEOUS
  Administered 2015-04-19: 3 [IU] via SUBCUTANEOUS
  Administered 2015-04-19: 1 [IU] via SUBCUTANEOUS
  Administered 2015-04-19: 3 [IU] via SUBCUTANEOUS
  Administered 2015-04-21: 1 [IU] via SUBCUTANEOUS
  Administered 2015-04-21: 2 [IU] via SUBCUTANEOUS
  Filled 2015-04-13: qty 2
  Filled 2015-04-13: qty 3
  Filled 2015-04-13: qty 1
  Filled 2015-04-13: qty 3
  Filled 2015-04-13 (×4): qty 1
  Filled 2015-04-13 (×3): qty 2
  Filled 2015-04-13: qty 1
  Filled 2015-04-13: qty 3
  Filled 2015-04-13 (×2): qty 1

## 2015-04-13 NOTE — H&P (Signed)
[place] [facility] [place] NAME: Jason Aguilar    MR#:  [id number]  DATE OF BIRTH:  1935-06-02  DATE OF ADMISSION:  04/13/2015  PRIMARY CARE PHYSICIAN: Valera Castle, MD   REQUESTING/REFERRING PHYSICIAN: Dr. Reita Cliche  CHIEF COMPLAINT:   Altered mental status HISTORY OF PRESENT ILLNESS:  Jason Aguilar  is a 80 y.o. male with a known history of recent right basilar acute lacunar infarct, ESRD on hemodialysis, GERD, hypertension comes to the emergency room brought in by family since patient was not his usual self and was more lethargic and sleepy. Patient had his dialysis today. During my evaluation patient is awake alert and oriented 3 answered all my questions very appropriately. I tried to contact patient's daughter and wife to speak with them and however unable to get any response on the 3 numbers listed in the computer. Patient was hemodynamically stable. He had no neuro deficit. He was requesting to go home. Patient denies any complaints. He does not remember why he is here. Patient is being admitted with altered mental status. CT head no acute stroke noted.  PAST MEDICAL HISTORY:   Past Medical History  Diagnosis Date  . Glaucoma     both eyes Drs. Arrie Eastern and [place]  . Hematuria syndrome     s/p negative cystoscopy  . GERD (gastroesophageal reflux disease) 2003    EGD  . Allergy   . Hyperlipidemia   . Obstructive sleep apnea of adult     CPAP at 8 cm H20  . Noncompliance with medication regimen   . 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 by [facility]: low grade noninvasive  . Chronic kidney disease (CKD), stage III (moderate)     with proteinuria  . Glaucoma   . Hypertension   . Diabetes mellitus     Type 2  . Cataract due to secondary diabetes mellitus ([place])   . COPD (chronic obstructive pulmonary disease) ([place])     secondary to tobacco abuse  . History of tobacco abuse     60 pack year history; quit 1980     PAST SURGICAL HISTOIRY:   Past Surgical History  Procedure Laterality Date  . Elbow surgery    . Transurethral resection of bladder tumor  Mar 13 2011    [facility]  . Peripheral vascular catheterization N/A 01/03/2015    Procedure: Dialysis/Perma Catheter Insertion;  Surgeon: Katha Cabal, MD;  Location: [place] [facility];  Service: Cardiovascular;  Laterality: N/A;  . Skin graft    . Cataract extraction      SOCIAL HISTORY:   Social History  Substance Use Topics  . Smoking status: Former Smoker    Quit date: 10/18/1980  . Smokeless tobacco: Never Used  . Alcohol Use: 1.8 oz/week    3 Cans of beer per week    FAMILY HISTORY:   Family History  Problem Relation Age of Onset  . Diabetes Mother   . Heart disease Father     DRUG ALLERGIES:   Allergies  Allergen Reactions  . Losartan Swelling  . Dristan Rash    REVIEW OF SYSTEMS:  Review of Systems  Constitutional: Negative for fever, chills and weight loss.  HENT: Negative for ear discharge, ear pain and nosebleeds.   Eyes: Negative for blurred vision, pain and discharge.  Respiratory: Negative for sputum production, shortness of breath, wheezing and stridor.   Cardiovascular: Negative for chest pain, palpitations, orthopnea and PND.  Gastrointestinal: Negative for nausea, vomiting, abdominal pain and diarrhea.  Genitourinary: Negative for urgency and frequency.  Musculoskeletal: Negative for back pain and joint pain.  Neurological: Negative for sensory change, speech change, focal weakness and weakness.  Psychiatric/Behavioral: Negative for depression and hallucinations. The patient is not nervous/anxious.      MEDICATIONS AT HOME:   Prior to Admission medications   Medication Sig Start Date End Date Taking? Authorizing Provider  ALPRAZolam Duanne Moron) 0.25 MG tablet Take 0.25 mg by mouth at bedtime as needed for sleep.   Yes Historical Provider, MD  amLODipine (NORVASC) 5 MG tablet Take 5 mg by mouth  daily.   Yes Historical Provider, MD  aspirin 81 MG chewable tablet Chew 1 tablet (81 mg total) by mouth daily. 04/05/15  Yes [name], MD  calcium acetate (PHOSLO) 667 MG capsule Take 2 capsules (1,334 mg total) by mouth 3 (three) times daily with meals. 01/06/15  Yes Gladstone Lighter, MD  carbidopa-levodopa (SINEMET IR) 25-100 MG tablet Take 2 tablets by mouth 2 (two) times daily. 04/05/15  Yes [name], MD  cloNIDine (CATAPRES) 0.1 MG tablet Take 1 tablet (0.1 mg total) by mouth 2 (two) times daily. 01/06/15  Yes Gladstone Lighter, MD  clopidogrel (PLAVIX) 75 MG tablet Take 1 tablet (75 mg total) by mouth daily. 04/05/15  Yes [name], MD  doxazosin (CARDURA) 8 MG tablet Take 8 mg by mouth at bedtime.    Yes Historical Provider, MD  hydrALAZINE (APRESOLINE) 100 MG tablet Take 100 mg by mouth 3 (three) times daily.   Yes Historical Provider, MD  insulin detemir (LEVEMIR) 100 UNIT/ML injection Inject 0.2 mLs (20 Units total) into the skin daily. 04/05/15  Yes [name], MD  insulin regular (NOVOLIN R) 100 units/mL injection Inject 0.02 mLs (2 Units total) into the skin 3 (three) times daily before meals. 04/05/15  Yes [name], MD  metoprolol (LOPRESSOR) 50 MG tablet Take 50 mg by mouth 2 (two) times daily.     Yes Historical Provider, MD  QUEtiapine (SEROQUEL) 25 MG tablet Take 1 tablet (25 mg total) by mouth 2 (two) times daily. 04/05/15  Yes [name], MD  simvastatin (ZOCOR) 40 MG tablet Take 40 mg by mouth at bedtime.   Yes Historical Provider, MD  sodium bicarbonate 650 MG tablet Take 650 mg by mouth 2 (two) times daily.   Yes Historical Provider, MD      VITAL SIGNS:  Blood pressure 170/52, pulse 77, temperature 97.5 F (36.4 C), resp. rate 12, height 5\' 5"  (1.651 m), weight 63.504 kg (140 lb), SpO2 98 %.  PHYSICAL EXAMINATION:  GENERAL:  80 y.o.-year-old patient lying in the bed with no acute distress.  EYES: Pupils equal, round,  reactive to light and accommodation. No scleral icterus. Extraocular muscles intact.  HEENT: Head atraumatic, normocephalic. Oropharynx and nasopharynx clear.  NECK:  Supple, no jugular venous distention. No thyroid enlargement, no tenderness.  LUNGS: Normal breath sounds bilaterally, no wheezing, rales,rhonchi or crepitation. No use of accessory muscles of respiration. HD catheter CARDIOVASCULAR: S1, S2 normal. No murmurs, rubs, or gallops.  ABDOMEN: Soft, nontender, nondistended. Bowel sounds present. No organomegaly or mass.  EXTREMITIES: No pedal edema, cyanosis, or clubbing.  NEUROLOGIC: Cranial nerves II through XII are intact. Muscle strength 5/5 in all extremities. Sensation intact. Gait not checked. No neuro deficit PSYCHIATRIC: The patient is alert and oriented x 3.  SKIN: No obvious rash, lesion, or ulcer.   LABORATORY PANEL:   CBC  Recent Labs  Lab 04/13/15 1833  WBC 11.5*  HGB 10.1*  HCT 30.6*  PLT 234   ------------------------------------------------------------------------------------------------------------------  Chemistries   Recent Labs Lab 04/13/15 1833  NA 135  K 3.5  CL 102  CO2 26  GLUCOSE 188*  BUN 10  CREATININE 2.04*  CALCIUM 9.2   ------------------------------------------------------------------------------------------------------------------  Cardiac Enzymes  Recent Labs Lab 04/13/15 1833  TROPONINI <0.03   ------------------------------------------------------------------------------------------------------------------  RADIOLOGY:  Ct Head Wo Contrast  04/13/2015  CLINICAL DATA:  Altered mental status, lethargy after dialysis. Treated for stroke last week. EXAM: CT HEAD WITHOUT CONTRAST TECHNIQUE: Contiguous axial images were obtained from the base of the skull through the vertex without intravenous contrast. COMPARISON:  Head CT dated 03/28/2015 and brain MRI dated 03/30/2015. FINDINGS: Again noted is generalized brain atrophy with  commensurate dilatation of the ventricles and sulci. Confluent areas of low density are again seen throughout the bilateral periventricular and subcortical white matter regions compatible with changes of chronic small vessel ischemia. There is no mass, hemorrhage, edema or other evidence of acute parenchymal abnormality. No extra-axial hemorrhage. Osseous structures are unremarkable. IMPRESSION: No acute findings. No intracranial mass, hemorrhage or edema. Extensive chronic small vessel ischemic change throughout the cerebral white matter, stable compared to earlier head CT. Electronically Signed   By: Franki Cabot M.D.   On: 04/13/2015 19:29    EKG:  Atrial fibrillation with slow ventricular rate  IMPRESSION AND PLAN:   Jason Aguilar  is a 80 y.o. male with a known history of recent right basilar acute lacunar infarct, ESRD on hemodialysis, GERD, hypertension comes to the emergency room brought in by family since patient was not his usual self and was more lethargic and sleepy. Patient had his dialysis today. During my evaluation patient is awake alert and oriented 3 answered all my questions very appropriately.  1. Augmentin status/lethargy/possibility of lack of sleep -Patient appears neurologically intact. -CT head negative. -We'll continue neuro checks every shift given history of recent right basilar infarct -Continue aspirin Plavix -No further radiological studies unless patient shows decline in neuro status  2. Hypertension continue clonidine, hydralazine, amlodipine, metoprolol  3. Type 2 diabetes continue insulin and sliding scale  4. Hyperlipidemia on simvastatin and  5 ESRD on hemodialysis nephrology consultation  6. DVT prophylaxis subcutaneous heparin   PT to see patient. Patient reports he ambulates with a cane at home   All the records are reviewed and case discussed with ED provider. Management plans discussed with the patient,  and they are in agreement.  I was not  able to reach patient's wife or daughter with the phone numbers listed in the computer. Patient was requesting to go home high worse since family was not able to be reached at the admitted for overnight observation.   CODE STATUS: Full   TOTAL TIME TAKING CARE OF THIS PATIENT: 45es.    Jason Aguilar M.D on 04/13/2015 at 10:06 PM  Between 7am to 6pm - Pager - [phone]  After 6pm go to [email or web address] - password [email or web address] [facility]  Office  [phone]  CC: Primary care physician; Valera Castle, MD

## 2015-04-13 NOTE — ED Notes (Signed)
Pt remains lethargic.  Pt arouses to verbal stimuli, but drifts off to sleep during conversation

## 2015-04-13 NOTE — ED Notes (Signed)
[place] sent to lab

## 2015-04-13 NOTE — ED Notes (Signed)
Pt to ER via EMS from home.  Per reports of wife after dialysis today pt was more lethargic than normal.  States treated here for a "stroke" last week.

## 2015-04-13 NOTE — ED Provider Notes (Signed)
[facility] Emergency Department Provider Note   ____________________________________________  Time seen: Approximately 6pm I have reviewed the triage vital signs and the triage nursing note.  HISTORY  Chief Complaint Weakness   Historian Limited, pt poor historian and here for altered mental status Information per paramedic report  HPI Jason Aguilar is a 80 y.o. male with a history of recent basal ganglia stroke, and end-stage renal disease, brought here today by EMS due to family stated the patient is more lethargic than normal after returning from dialysis today back home.  Patient states he doesn't know why he is in the emergency room. He states he is in no pain. He is a very poor historian. He was recently diagnosed with a stroke about a week ago.  He does report that he makes some urine daily.  Per EMS report who does know this gentleman, he reportedly is typically spry and communicative and walks on his own. Today he is pretty lethargic and sleepy. He does wake up to talk but then goes right back to sleep.    Past Medical History  Diagnosis Date  . Glaucoma     both eyes Drs. Arrie Eastern and [place]  . Hematuria syndrome     s/p negative cystoscopy  . GERD (gastroesophageal reflux disease) 2003    EGD  . Allergy   . Hyperlipidemia   . Obstructive sleep apnea of adult     CPAP at 8 cm H20  . Noncompliance with medication regimen   . 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 by [facility]: low grade noninvasive  . Chronic kidney disease (CKD), stage III (moderate)     with proteinuria  . Glaucoma   . Hypertension   . Diabetes mellitus     Type 2  . Cataract due to secondary diabetes mellitus ([place])   . COPD (chronic obstructive pulmonary disease) ([place])     secondary to tobacco abuse  . History of tobacco abuse     60 pack year history; quit 1980    Patient Active Problem List   Diagnosis Date Noted  . Basal ganglia infarction  ([place]) 03/31/2015  . Acute delirium 03/29/2015  . Altered mental state 03/28/2015  . Acute diastolic CHF (congestive heart failure) ([place]) 01/06/2015  . ESRD on dialysis ([place]) 01/06/2015  . Hypertensive urgency 01/02/2015  . Chest pain, localized 09/02/2011  . Adrenal nodule ([place]) 07/04/2011  . Cataract due to secondary diabetes mellitus ([place])   . Glaucoma   . COPD (chronic obstructive pulmonary disease) ([place])   . History of tobacco abuse   . Gout attack 04/12/2011  . Back pain, lumbosacral 04/07/2011  . Diabetes mellitus type II, uncontrolled ([place]) 04/07/2011  . transitional cell, bladder   . Nephropathy due to secondary diabetes mellitus ([place]) 10/21/2010  . Hypertension   . Hyperlipidemia   . Obstructive sleep apnea of adult   . Noncompliance with medication regimen     Past Surgical History  Procedure Laterality Date  . Elbow surgery    . Transurethral resection of bladder tumor  Mar 13 2011    [facility]  . Peripheral vascular catheterization N/A 01/03/2015    Procedure: Dialysis/Perma Catheter Insertion;  Surgeon: Katha Cabal, MD;  Location: [place] [facility];  Service: Cardiovascular;  Laterality: N/A;  . Skin graft    . Cataract extraction      Current Outpatient Rx  Name  Route  Sig  Dispense  Refill  . ALPRAZolam Duanne Moron)  0.25 MG tablet   Oral   Take 0.25 mg by mouth at bedtime as needed for sleep.         Marland Kitchen amLODipine (NORVASC) 5 MG tablet   Oral   Take 5 mg by mouth daily.         Marland Kitchen aspirin 81 MG chewable tablet   Oral   Chew 1 tablet (81 mg total) by mouth daily.   30 tablet   0   . calcium acetate (PHOSLO) 667 MG capsule   Oral   Take 2 capsules (1,334 mg total) by mouth 3 (three) times daily with meals.   180 capsule   2   . carbidopa-levodopa (SINEMET IR) 25-100 MG tablet   Oral   Take 2 tablets by mouth 2 (two) times daily.   30 tablet   0   . cloNIDine (CATAPRES) 0.1 MG tablet   Oral   Take 1 tablet (0.1 mg total) by mouth 2 (two)  times daily.   60 tablet   2   . clopidogrel (PLAVIX) 75 MG tablet   Oral   Take 1 tablet (75 mg total) by mouth daily.   30 tablet   0   . doxazosin (CARDURA) 8 MG tablet   Oral   Take 8 mg by mouth at bedtime.          . hydrALAZINE (APRESOLINE) 100 MG tablet   Oral   Take 100 mg by mouth 3 (three) times daily.         . insulin detemir (LEVEMIR) 100 UNIT/ML injection   Subcutaneous   Inject 0.2 mLs (20 Units total) into the skin daily.   10 mL   3   . insulin regular (NOVOLIN R) 100 units/mL injection   Subcutaneous   Inject 0.02 mLs (2 Units total) into the skin 3 (three) times daily before meals.   10 mL   11   . metoprolol (LOPRESSOR) 50 MG tablet   Oral   Take 50 mg by mouth 2 (two) times daily.           . QUEtiapine (SEROQUEL) 25 MG tablet   Oral   Take 1 tablet (25 mg total) by mouth 2 (two) times daily.   30 tablet   0   . simvastatin (ZOCOR) 40 MG tablet   Oral   Take 40 mg by mouth at bedtime.         . sodium bicarbonate 650 MG tablet   Oral   Take 650 mg by mouth 2 (two) times daily.           Allergies Losartan and Dristan  Family History  Problem Relation Age of Onset  . Diabetes Mother   . Heart disease Father     Social History Social History  Substance Use Topics  . Smoking status: Former Smoker    Quit date: 10/18/1980  . Smokeless tobacco: Never Used  . Alcohol Use: 1.8 oz/week    3 Cans of beer per week    Review of Systems Limited as patient is a very poor historian Constitutional: Denies fever. Eyes: Negative for visual changes. ENT: Negative for sore throat. Cardiovascular: Negative for chest pain. Respiratory: Negative for shortness of breath. Gastrointestinal: Negative for abdominal pain, vomiting and diarrhea. Genitourinary: Negative for dysuria. Musculoskeletal: Negative for back pain. Skin: Negative for rash. Neurological: Negative for headache. 10 point Review of Systems otherwise  negative ____________________________________________   PHYSICAL EXAM:  VITAL SIGNS: ED Triage Vitals  Enc Vitals Group     BP 04/13/15 1724 142/48 mmHg     Pulse Rate 04/13/15 1724 70     Resp 04/13/15 1724 12     Temp 04/13/15 1724 97.5 F (36.4 C)     Temp src --      SpO2 04/13/15 1724 98 %     Weight 04/13/15 1724 140 lb (63.504 kg)     Height 04/13/15 1724 5\' 5"  (1.651 m)     Head Cir --      Peak Flow --      Pain Score 04/13/15 1725 0     Pain Loc --      Pain Edu? --      Excl. in [place]? --      Constitutional: Alert and cooperative, but very poor historian. Sleepy with eyes closed until I ask him to awake to talk to me. In no distress. HEENT   Head: Normocephalic and atraumatic.      Eyes: Conjunctivae are normal. PERRL. Normal extraocular movements.      Ears:         Nose: No congestion/rhinnorhea.   Mouth/Throat: Mucous membranes are moist.   Neck: No stridor. Cardiovascular/Chest: Normal rate, regular rhythm.  No murmurs, rubs, or gallops. Respiratory: Normal respiratory effort without tachypnea nor retractions. Breath sounds are clear and equal bilaterally. No wheezes/rales/rhonchi. Gastrointestinal: Soft. No distention, no guarding, no rebound. Nontender.    Genitourinary/rectal:Deferred Musculoskeletal: Nontender with normal range of motion in all extremities. No joint effusions.  No lower extremity tenderness.  No edema. Neurologic:  No slurred speech. No facial droop. Poor historian, but follows commands.  No focal weakness appreciated. Skin:  Skin is warm, dry and intact. No rash noted.   ____________________________________________   EKG I, Lisa Roca, MD, the attending physician have personally viewed and interpreted all ECGs.  74 bpm normal sinus rhythm narrow QRS normal axis nonspecific T-wave ____________________________________________  LABS (pertinent positives/negatives)  Basic metabolic panel significant for creatinine  2.04 White blood cell count 11.5, hemoglobin 10.1 and platelet count 234 Troponin less than 0.03 Urinalysis pending  ____________________________________________  RADIOLOGY All Xrays were viewed by me. Imaging interpreted by Radiologist.  CT head noncontrast:  IMPRESSION: No acute findings. No intracranial mass, hemorrhage or edema. Extensive chronic small vessel ischemic change throughout the cerebral white matter, stable compared to earlier head CT. __________________________________________  PROCEDURES  Procedure(s) performed: None  Critical Care performed: None  ____________________________________________   ED COURSE / ASSESSMENT AND PLAN  Pertinent labs & imaging results that were available during my care of the patient were reviewed by me and considered in my medical decision making (see chart for details).   Patient was reportedly sent here because of some lethargy after dialysis today. His vital signs are all reassuring today. No hypertension. No fever. No hypoxia. He is a poor historian, but is cooperative and follows commands. Given his recent stroke, I will obtain head CT. I will also send screening laboratory studies as well as urinalysis because he does make urine daily reportedly.   Patient's laboratory studies are reassuring. His urinalysis is still pending.  His head CT shows no acute finding. He does remained extremely sleepy. I'm unclear what this is coming from. However this is reportedly not his baseline. I will discuss having the patient admitted for observation due to altered mental status.   CONSULTATIONS:   Hospitalist for admission   Patient / Family / Caregiver informed of clinical course, medical decision-making process, and  agree with plan.     ___________________________________________   FINAL CLINICAL IMPRESSION(S) / ED DIAGNOSES   Final diagnoses:  Altered mental status, unspecified altered mental status type               Note: This dictation was prepared with Dragon dictation. Any transcriptional errors that result from this process are unintentional   Lisa Roca, MD 04/13/15 2145

## 2015-04-14 LAB — CBC
HCT: 29 % — ABNORMAL LOW (ref 40.0–52.0)
HEMOGLOBIN: 9.8 g/dL — AB (ref 13.0–18.0)
MCH: 30.4 pg (ref 26.0–34.0)
MCHC: 33.9 g/dL (ref 32.0–36.0)
MCV: 89.7 fL (ref 80.0–100.0)
PLATELETS: 227 10*3/uL (ref 150–440)
RBC: 3.24 MIL/uL — ABNORMAL LOW (ref 4.40–5.90)
RDW: 14 % (ref 11.5–14.5)
WBC: 9.3 10*3/uL (ref 3.8–10.6)

## 2015-04-14 LAB — CREATININE, SERUM
CREATININE: 2.97 mg/dL — AB (ref 0.61–1.24)
GFR calc Af Amer: 22 mL/min — ABNORMAL LOW (ref >60)
GFR calc non Af Amer: 19 mL/min — ABNORMAL LOW (ref >60)

## 2015-04-14 LAB — GLUCOSE, CAPILLARY
GLUCOSE-CAPILLARY: 122 mg/dL — AB (ref 65–99)
GLUCOSE-CAPILLARY: 142 mg/dL — AB (ref 65–99)
GLUCOSE-CAPILLARY: 228 mg/dL — AB (ref 65–99)
Glucose-Capillary: 87 mg/dL (ref 65–99)
Glucose-Capillary: 153 mg/dL — ABNORMAL HIGH (ref 65–99)

## 2015-04-14 LAB — VITAMIN B12: VITAMIN B 12: 217 pg/mL (ref 180–914)

## 2015-04-14 MED ORDER — INSULIN DETEMIR 100 UNIT/ML ~~LOC~~ SOLN
20.0000 [IU] | Freq: Every day | SUBCUTANEOUS | Status: DC
Start: 2015-04-14 — End: 2015-04-19
  Administered 2015-04-14 – 2015-04-19 (×6): 20 [IU] via SUBCUTANEOUS
  Filled 2015-04-14 (×9): qty 0.2

## 2015-04-14 MED ORDER — ONDANSETRON HCL 4 MG PO TABS: 4.0000 mg | ORAL_TABLET | Freq: Four times a day (QID) | ORAL | Status: DC | PRN

## 2015-04-14 MED ORDER — ALPRAZOLAM 0.25 MG PO TABS: 0.2500 mg | ORAL_TABLET | Freq: Every evening | ORAL | Status: DC | PRN

## 2015-04-14 MED ORDER — HYDRALAZINE HCL 50 MG PO TABS
100.0000 mg | ORAL_TABLET | Freq: Three times a day (TID) | ORAL | Status: DC
  Administered 2015-04-14 – 2015-04-21 (×15): 100 mg via ORAL
  Filled 2015-04-14 (×18): qty 2

## 2015-04-14 MED ORDER — CLOPIDOGREL BISULFATE 75 MG PO TABS
75.0000 mg | ORAL_TABLET | Freq: Every day | ORAL | Status: DC
Start: 2015-04-14 — End: 2015-04-15
  Administered 2015-04-14: 75 mg via ORAL
  Filled 2015-04-14: qty 1

## 2015-04-14 MED ORDER — CALCIUM ACETATE (PHOS BINDER) 667 MG PO CAPS
1334.0000 mg | ORAL_CAPSULE | Freq: Three times a day (TID) | ORAL | Status: DC
  Administered 2015-04-14 – 2015-04-21 (×19): 1334 mg via ORAL
  Filled 2015-04-14 (×20): qty 2

## 2015-04-14 MED ORDER — CARBIDOPA-LEVODOPA 25-100 MG PO TABS
2.0000 | ORAL_TABLET | Freq: Two times a day (BID) | ORAL | Status: DC
  Administered 2015-04-14 – 2015-04-15 (×3): 2 via ORAL
  Filled 2015-04-14 (×4): qty 2

## 2015-04-14 MED ORDER — ONDANSETRON HCL 4 MG/2ML IJ SOLN: 4.0000 mg | Freq: Four times a day (QID) | INTRAMUSCULAR | Status: DC | PRN

## 2015-04-14 MED ORDER — HEPARIN SODIUM (PORCINE) 5000 UNIT/ML IJ SOLN
5000.0000 [IU] | Freq: Three times a day (TID) | INTRAMUSCULAR | Status: DC
  Administered 2015-04-14 – 2015-04-21 (×19): 5000 [IU] via SUBCUTANEOUS
  Filled 2015-04-14 (×19): qty 1

## 2015-04-14 MED ORDER — SIMVASTATIN 40 MG PO TABS
40.0000 mg | ORAL_TABLET | Freq: Every day | ORAL | Status: DC
  Administered 2015-04-14 – 2015-04-20 (×7): 40 mg via ORAL
  Filled 2015-04-14 (×7): qty 1

## 2015-04-14 MED ORDER — ASPIRIN 81 MG PO CHEW
81.0000 mg | CHEWABLE_TABLET | Freq: Every day | ORAL | Status: DC
  Administered 2015-04-14 – 2015-04-21 (×8): 81 mg via ORAL
  Filled 2015-04-14 (×8): qty 1

## 2015-04-14 MED ORDER — DOXAZOSIN MESYLATE 4 MG PO TABS
8.0000 mg | ORAL_TABLET | Freq: Every day | ORAL | Status: DC
  Administered 2015-04-14 – 2015-04-20 (×7): 8 mg via ORAL
  Filled 2015-04-14 (×7): qty 2

## 2015-04-14 MED ORDER — INSULIN REGULAR HUMAN 100 UNIT/ML IJ SOLN
2.0000 [IU] | Freq: Three times a day (TID) | INTRAMUSCULAR | Status: DC
Start: 2015-04-14 — End: 2015-04-14

## 2015-04-14 MED ORDER — ACETAMINOPHEN 325 MG PO TABS: 650.0000 mg | ORAL_TABLET | Freq: Four times a day (QID) | ORAL | Status: DC | PRN

## 2015-04-14 MED ORDER — SODIUM BICARBONATE 650 MG PO TABS
650.0000 mg | ORAL_TABLET | Freq: Two times a day (BID) | ORAL | Status: DC
  Administered 2015-04-14 – 2015-04-21 (×14): 650 mg via ORAL
  Filled 2015-04-14 (×14): qty 1

## 2015-04-14 MED ORDER — INSULIN ASPART 100 UNIT/ML ~~LOC~~ SOLN
2.0000 [IU] | Freq: Three times a day (TID) | SUBCUTANEOUS | Status: DC
  Administered 2015-04-14 – 2015-04-21 (×15): 2 [IU] via SUBCUTANEOUS
  Filled 2015-04-14 (×15): qty 2

## 2015-04-14 MED ORDER — ACETAMINOPHEN 650 MG RE SUPP: 650.0000 mg | Freq: Four times a day (QID) | RECTAL | Status: DC | PRN

## 2015-04-14 MED ORDER — QUETIAPINE FUMARATE 25 MG PO TABS
25.0000 mg | ORAL_TABLET | Freq: Two times a day (BID) | ORAL | Status: DC
  Administered 2015-04-14 – 2015-04-17 (×6): 25 mg via ORAL
  Filled 2015-04-14 (×6): qty 1

## 2015-04-14 MED ORDER — METOPROLOL TARTRATE 50 MG PO TABS
50.0000 mg | ORAL_TABLET | Freq: Two times a day (BID) | ORAL | Status: DC
  Administered 2015-04-14 – 2015-04-21 (×12): 50 mg via ORAL
  Filled 2015-04-14 (×12): qty 1

## 2015-04-14 MED ORDER — AMLODIPINE BESYLATE 5 MG PO TABS
5.0000 mg | ORAL_TABLET | Freq: Every day | ORAL | Status: DC
  Administered 2015-04-14 – 2015-04-21 (×7): 5 mg via ORAL
  Filled 2015-04-14 (×7): qty 1

## 2015-04-14 MED ORDER — CLONIDINE HCL 0.1 MG PO TABS
0.1000 mg | ORAL_TABLET | Freq: Two times a day (BID) | ORAL | Status: DC
  Administered 2015-04-14 – 2015-04-21 (×13): 0.1 mg via ORAL
  Filled 2015-04-14 (×13): qty 1

## 2015-04-14 NOTE — Progress Notes (Signed)
[place] at [facility]                                                                                                                                                                                            Patient Demographics   Jason Aguilar, is a 80 y.o. male, DOB - October 09, 1935, [id number]  Admit date - 04/13/2015   Admitting Physician Fritzi Mandes, MD  Outpatient Primary MD for the patient is Olmedo, Guy Begin, MD   LOS -   Subjective:Patient admitted with altered mental status currently reports that he is very weak and reports that having difficulty with walking. I try to reach his family with the phone numbers are provided and no but is picking up the phone. Was seen by physical therapy earlier they recommended outpatient PT.     Review of Systems:   CONSTITUTIONAL: No documented fever. Positive fatigue  and weakness. No weight gain, no weight loss.  EYES: No blurry or double vision.  ENT: No tinnitus. No postnasal drip. No redness of the oropharynx.  RESPIRATORY: No cough, no wheeze, no hemoptysis. No dyspnea.  CARDIOVASCULAR: No chest pain. No orthopnea. No palpitations. No syncope.  GASTROINTESTINAL: No nausea, no vomiting or diarrhea. No abdominal pain. No melena or hematochezia.  GENITOURINARY: No dysuria or hematuria.  ENDOCRINE: No polyuria or nocturia. No heat or cold intolerance.  HEMATOLOGY: No anemia. No bruising. No bleeding.  INTEGUMENTARY: No rashes. No lesions.  MUSCULOSKELETAL: No arthritis. No swelling. No gout.  NEUROLOGIC: No numbness, tingling, or ataxia. No seizure-type activity.  PSYCHIATRIC: No anxiety. No insomnia. No ADD.    Vitals:   Filed Vitals:   04/14/15 0012 04/14/15 0123 04/14/15 0640 04/14/15 1228  BP:  160/58 144/49 131/70  Pulse:  73 58 62  Temp: 98.6 F (37 C) 98.3 F (36.8 C) 98.4 F (36.9 C) 97.8 F (36.6 C)  TempSrc:  Oral Oral Oral  Resp:  12 16 15   Height:      Weight:  59.376 kg  (130 lb 14.4 oz)    SpO2:  100% 95% 98%    Wt Readings from Last 3 Encounters:  04/14/15 59.376 kg (130 lb 14.4 oz)  04/04/15 63 kg (138 lb 14.2 oz)  03/28/15 68.04 kg (150 lb)     Intake/Output Summary (Last 24 hours) at 04/14/15 1327 Last data filed at 04/14/15 0900  Gross per 24 hour  Intake    360 ml  Output      0 ml  Net    360 ml    Physical Exam:   GENERAL: Pleasant-appearing in no apparent  distress. Appears weak HEAD, EYES, EARS, NOSE AND THROAT: Atraumatic, normocephalic. Extraocular muscles are intact. Pupils equal and reactive to light. Sclerae anicteric. No conjunctival injection. No oro-pharyngeal erythema.  NECK: Supple. There is no jugular venous distention. No bruits, no lymphadenopathy, no thyromegaly.  HEART: Regular rate and rhythm,. No murmurs, no rubs, no clicks.  LUNGS: Clear to auscultation bilaterally. No rales or rhonchi. No wheezes.  ABDOMEN: Soft, flat, nontender, nondistended. Has good bowel sounds. No hepatosplenomegaly appreciated.  EXTREMITIES: No evidence of any cyanosis, clubbing, or peripheral edema.  +2 pedal and radial pulses bilaterally.  NEUROLOGIC: The patient is alert, awake, and oriented x3 with no focal motor or sensory deficits appreciated bilaterally. Has generalized weakness of both lower extremity SKIN: Moist and warm with no rashes appreciated.  Psych: Not anxious, depressed LN: No inguinal LN enlargement    Antibiotics   Anti-infectives    None      Medications   Scheduled Meds: . amLODipine  5 mg Oral Daily  . aspirin  81 mg Oral Daily  . calcium acetate  1,334 mg Oral TID WC  . carbidopa-levodopa  2 tablet Oral BID  . cloNIDine  0.1 mg Oral BID  . clopidogrel  75 mg Oral Daily  . doxazosin  8 mg Oral QHS  . heparin  5,000 Units Subcutaneous 3 times per day  . hydrALAZINE  100 mg Oral TID  . insulin aspart  0-9 Units Subcutaneous TID WC  . insulin aspart  2 Units Subcutaneous TID AC  . insulin detemir  20 Units  Subcutaneous Daily  . metoprolol  50 mg Oral BID  . QUEtiapine  25 mg Oral BID  . simvastatin  40 mg Oral QHS  . sodium bicarbonate  650 mg Oral BID   Continuous Infusions:  PRN Meds:.acetaminophen **OR** acetaminophen, ALPRAZolam, ondansetron **OR** ondansetron (ZOFRAN) IV   Data Review:   Micro Results No results found for this or any previous visit (from the past 240 hour(s)).  Radiology Reports Dg Chest 2 View  03/30/2015  CLINICAL DATA:  Confusion and disorientation, type 2 diabetic, COPD. Previous smoker. EXAM: CHEST  2 VIEW COMPARISON:  Chest x-ray dated 01/02/2015. FINDINGS: Heart size is upper normal, stable. Overall cardiomediastinal silhouette is stable in size and configuration. Atherosclerotic calcifications again noted at the aortic arch. Right- sided dialysis catheter in place with tip well-positioned over the lower SVC. Mildly coarsened interstitial markings again noted bilaterally suggesting some degree of interstitial fibrosis. Lungs otherwise clear. No evidence of pneumonia. No pleural effusion. No pneumothorax seen. Mild degenerative spurring noted within the slightly scoliotic thoracic spine. No acute osseous abnormality. IMPRESSION: No acute findings. No evidence of pneumonia. Dialysis catheter appears adequately positioned. Chronic/incidental findings detailed above. Electronically Signed   By: Franki Cabot M.D.   On: 03/30/2015 08:17   Ct Head Wo Contrast  04/13/2015  CLINICAL DATA:  Altered mental status, lethargy after dialysis. Treated for stroke last week. EXAM: CT HEAD WITHOUT CONTRAST TECHNIQUE: Contiguous axial images were obtained from the base of the skull through the vertex without intravenous contrast. COMPARISON:  Head CT dated 03/28/2015 and brain MRI dated 03/30/2015. FINDINGS: Again noted is generalized brain atrophy with commensurate dilatation of the ventricles and sulci. Confluent areas of low density are again seen throughout the bilateral  periventricular and subcortical white matter regions compatible with changes of chronic small vessel ischemia. There is no mass, hemorrhage, edema or other evidence of acute parenchymal abnormality. No extra-axial hemorrhage. Osseous structures are unremarkable. IMPRESSION:  No acute findings. No intracranial mass, hemorrhage or edema. Extensive chronic small vessel ischemic change throughout the cerebral white matter, stable compared to earlier head CT. Electronically Signed   By: Franki Cabot M.D.   On: 04/13/2015 19:29   Ct Head Wo Contrast  03/28/2015  CLINICAL DATA:  80 year old male with history of altered mental status and slurred speech. EXAM: CT HEAD WITHOUT CONTRAST TECHNIQUE: Contiguous axial images were obtained from the base of the skull through the vertex without intravenous contrast. COMPARISON:  No priors. FINDINGS: Mild cerebral and cerebellar atrophy. Physiologic calcifications in the basal ganglia bilaterally. Patchy and confluent areas of decreased attenuation are noted throughout the deep and periventricular white matter of the cerebral hemispheres bilaterally, compatible with chronic microvascular ischemic disease. No acute intracranial abnormalities. Specifically, no evidence of acute intracranial hemorrhage, no definite findings of acute/subacute cerebral ischemia, no mass, mass effect, hydrocephalus or abnormal intra or extra-axial fluid collections. Visualized paranasal sinuses and mastoids are well pneumatized. No acute displaced skull fractures are identified. IMPRESSION: 1. No acute intracranial abnormalities. 2. Mild cerebral and cerebellar atrophy with extensive chronic microvascular ischemic changes throughout cerebral white matter, as above. Electronically Signed   By: Vinnie Langton M.D.   On: 03/28/2015 14:29   Mr Jodene Nam Head Wo Contrast  04/03/2015  CLINICAL DATA:  80 year old male with bilateral basal ganglia infarcts. Altered mental status. Subsequent encounter. EXAM: MRA  HEAD WITHOUT CONTRAST TECHNIQUE: Angiographic images of the Circle of Willis were obtained using MRA technique without intravenous contrast. COMPARISON:  03/30/2015 brain MR. FINDINGS: Right vertebral artery occluded at the level of the foramen magnum. Mild narrowing and irregularity left vertebral artery. No significant stenosis of the basilar artery. Mild to moderate narrowing and irregularity superior cerebellar artery greater on the left. Fetal type contribution to the right posterior cerebral artery. Mild to moderate narrowing and irregularity mid to distal posterior cerebral arteries. Nonvisualized posterior inferior cerebellar artery and anterior inferior cerebellar artery bilaterally. Marked tandem stenosis right internal carotid artery cavernous and precavernous segment. Mild to moderate narrowing supraclinoid segment with poststenotic dilation. Marked narrowing proximal right posterior communicating artery. Hypoplastic A1 segment right anterior cerebral artery with superimposed marked narrowing. Mild moderate narrowing distal M1 segment right middle cerebral artery. Moderate to marked narrowing cavernous segment left internal carotid artery with mild dilation supraclinoid aspect. Mild narrowing proximal A1 segment left anterior cerebral artery. Mild narrowing and irregularity A2 segment anterior cerebral artery bilaterally (supplied from the left). Mild to moderate narrowing left middle cerebral artery bifurcation. Moderate to marked narrowing middle cerebral artery branch vessels bilaterally with decrease number of visualized middle cerebral artery branch vessels on the right. Bulge proximal M1 segment left middle cerebral artery felt to be origin of a vessel rather than aneurysm. Exam is slightly motion degraded. IMPRESSION: Significant intracranial atherosclerotic type changes noted on this motion degraded examination as detailed above Electronically Signed   By: Genia Del M.D.   On: 04/03/2015  13:11   Mr Brain Wo Contrast  03/30/2015  CLINICAL DATA:  Intermittent alteration of mental status. Involuntary movements of arms and legs. EXAM: MRI HEAD WITHOUT CONTRAST TECHNIQUE: Multiplanar, multiecho pulse sequences of the brain and surrounding structures were obtained without intravenous contrast. COMPARISON:  CT head 03/28/2015. FINDINGS: BILATERAL punctate areas of basal ganglia restricted diffusion, greater on the RIGHT, representing acute lacunar infarction. Hypoperfusion event or shower of emboli could be responsible. No cerebellar or brainstem lesions. No visible hemorrhage, mass lesion, or extra-axial fluid. Generalized atrophy. Hydrocephalus ex vacuo.  Extensive white matter disease. Cannot assess for chronic hemorrhage due to motion. No flow void in the RIGHT vertebral, uncertain duration. No midline abnormality. Extracranial soft tissues grossly unremarkable. Compared with prior CT, no abnormality is detectable. IMPRESSION: BILATERAL acute basal ganglia lacunar infarctions, greater on the RIGHT. Atrophy and small vessel disease. No flow in the RIGHT vertebral, uncertain duration. Significance doubtful to the acute event, as there is no cerebellar ischemia. Electronically Signed   By: Staci Righter M.D.   On: 03/30/2015 14:47   US Carotid Bilateral  04/01/2015  CLINICAL DATA:  Stroke EXAM: BILATERAL CAROTID DUPLEX ULTRASOUND TECHNIQUE: Pearline Cables scale imaging, color Doppler and duplex ultrasound were performed of bilateral carotid and vertebral arteries in the neck. COMPARISON:  None. FINDINGS: Criteria: Quantification of carotid stenosis is based on velocity parameters that correlate the residual internal carotid diameter with NASCET-based stenosis levels, using the diameter of the distal internal carotid lumen as the denominator for stenosis measurement. The following velocity measurements were obtained: RIGHT ICA:  93 cm/sec CCA:  123456 cm/sec SYSTOLIC ICA/CCA RATIO:  0.8 DIASTOLIC ICA/CCA RATIO:  1.2  ECA:  281 cm/sec LEFT ICA:  100 cm/sec CCA:  [id number] cm/sec SYSTOLIC ICA/CCA RATIO:  0.7 DIASTOLIC ICA/CCA RATIO:  1.4 ECA:  147 cm/sec RIGHT CAROTID ARTERY: Moderate amount of calcified and noncalcified plaque throughout the right common carotid and internal carotid arteries. RIGHT VERTEBRAL ARTERY:  Patent with appropriate antegrade flow. LEFT CAROTID ARTERY: Moderate amount of calcified and noncalcified plaque throughout the left common carotid and internal carotid arteries. LEFT VERTEBRAL ARTERY:  Patent with appropriate antegrade flow. IMPRESSION: Moderate amount of calcified and noncalcified plaque throughout the bilateral common carotid and internal carotid arteries. Velocity measurements throughout the bilateral carotid arteries are within normal limits with no evidence of hemodynamically significant stenosis. Electronically Signed   By: Franki Cabot M.D.   On: 04/01/2015 17:02     CBC  Recent Labs Lab 04/13/15 1833 04/14/15 0346  WBC 11.5* 9.3  HGB 10.1* 9.8*  HCT 30.6* 29.0*  PLT 234 227  MCV 91.4 89.7  MCH 30.2 30.4  MCHC 33.1 33.9  RDW 14.0 14.0  LYMPHSABS 1.1  --   MONOABS 0.4  --   EOSABS 0.0  --   BASOSABS 0.0  --     Chemistries   Recent Labs Lab 04/13/15 1833 04/14/15 0346  NA 135  --   K 3.5  --   CL 102  --   CO2 26  --   GLUCOSE 188*  --   BUN 10  --   CREATININE 2.04* 2.97*  CALCIUM 9.2  --    ------------------------------------------------------------------------------------------------------------------ estimated creatinine clearance is 17.5 mL/min (by C-G formula based on Cr of 2.97). ------------------------------------------------------------------------------------------------------------------ No results for input(s): HGBA1C in the last 72 hours. ------------------------------------------------------------------------------------------------------------------ No results for input(s): CHOL, HDL, LDLCALC, TRIG, CHOLHDL, LDLDIRECT in the last 72  hours. ------------------------------------------------------------------------------------------------------------------ No results for input(s): TSH, T4TOTAL, T3FREE, THYROIDAB in the last 72 hours.  Invalid input(s): FREET3 ------------------------------------------------------------------------------------------------------------------ No results for input(s): VITAMINB12, FOLATE, FERRITIN, TIBC, IRON, RETICCTPCT in the last 72 hours.  Coagulation profile No results for input(s): INR, PROTIME in the last 168 hours.  No results for input(s): DDIMER in the last 72 hours.  Cardiac Enzymes  Recent Labs Lab 04/13/15 1833  TROPONINI <0.03   ------------------------------------------------------------------------------------------------------------------ Invalid input(s): POCBNP    Assessment & Plan   Jason Aguilar is a 80 y.o. male with a known history of recent right basilar acute lacunar infarct, ESRD on  hemodialysis, GERD, hypertension comes to the emergency room brought in by family since patient was not his usual self and was more lethargic and sleepy.   1. Altered mental status worsening weakness-Patient appears neurologically intact. Due to his recent CVA we will obtain MRI of the brain. Continue aspirin and Plavix Try to reach family again  2. Hypertension continue clonidine, hydralazine, amlodipine, metoprolol blood pressures under control  3. Type 2 diabetes continue insulin and sliding scale, adjust his Levemir based on his blood sugar currently stable 4. Hyperlipidemia continue simvastatin  5 ESRD on hemodialysis nephrology consultation for HD  6. DVT prophylaxis subcutaneous heparin      Code Status Orders        Start     Ordered   04/14/15 0118  Full code   Continuous     04/14/15 0117    Code Status History    Date Active Date Inactive Code Status Order ID Comments User Context   03/29/2015 12:42 AM 04/05/2015  8:06 PM Full Code [id number]  Lance Coon,  MD Inpatient   01/02/2015  9:42 PM 01/06/2015  6:51 PM Full Code [id number]  Lytle Butte, MD ED           Consults  None DVT Prophylaxis  heparin  Lab Results  Component Value Date   PLT 227 04/14/2015     Time Spent in minutes   35 minutes  Greater than 50% of time spent in care coordination and counseling patient regarding the condition and plan of care.   Dustin Flock M.D on 04/14/2015 at 1:27 PM  Between 7am to 6pm - Pager - [phone]  After 6pm go to [email or web address] - password [email or web address] [facility]   Office  [phone]

## 2015-04-14 NOTE — Evaluation (Signed)
Physical Therapy Evaluation Patient Details Name: TAJ BENARD MRN: [id number] DOB: 1935/03/23 Today's Date: 04/14/2015   History of Present Illness  presented to ER and admitted under observation for AMS, lethargy; CT head negative for acute change.  Of note, patient with recent hospitalziation (approx 2-3 weeks prior) with R lacunar infarct, AMS; discharged home with outpatient PT.  Clinical Impression  Upon evaluation, patient alert and oriented to basic information; generally confused to higher-level information and displays limited insight/safety awareness.  Bilat UE/LE strength and ROM grossly WFL and symmetrical; no focal weakness or sensory deficit noted.  Able to complete bed mobility with mod indep; sit/stand, basic transfers and gait (250') with RW, cga/close sup.  Mild instability with head turns, but patient able to self-correct with LE step strategy.  Per BERG balance assesment (22/56), patient with noted deficits in higher-level balance activities (esp narrowed BOS, SLS activities) and at high risk for falls with functional mobility; recommend continued use of SPC with all mobility upon discharge. Would benefit from skilled PT to address above deficits and promote optimal return to PLOF; recommend transition to home with 24 hour sup/assist and outpatient PT follow up as appropriate.     Follow Up Recommendations Outpatient PT    Equipment Recommendations  Rolling walker with 5" wheels    Recommendations for Other Services       Precautions / Restrictions Precautions Precautions: Fall Precaution Comments: R chest perm-cath Restrictions Weight Bearing Restrictions: No      Mobility  Bed Mobility Overal bed mobility: Modified Independent                Transfers Overall transfer level: Needs assistance Equipment used: Rolling walker (2 wheeled) Transfers: Sit to/from Stand Sit to Stand: Min guard         General transfer comment: cuing for hand placement  to maximize safety  Ambulation/Gait Ambulation/Gait assistance: Min guard Ambulation Distance (Feet): 250 Feet Assistive device: Rolling walker (2 wheeled)   Gait velocity: Decreased but adequate for limited community mobility   General Gait Details: broad BOS, but fair step height/length; maintains RW arms length anterior to patient with limited response to cuing for correction.  Veers laterally slightly with head turns (occasional scissoring), but self-corrects with LE step strategy.  Recommend continued use of RW at this time.  Stairs            Wheelchair Mobility    Modified Rankin (Stroke Patients Only)       Balance Overall balance assessment: Needs assistance Sitting-balance support: No upper extremity supported;Feet supported Sitting balance-Leahy Scale: Good     Standing balance support: No upper extremity supported Standing balance-Leahy Scale: Fair                   Standardized Balance Assessment Standardized Balance Assessment : Berg Balance Test Berg Balance Test Sit to Stand: Able to stand  independently using hands Standing Unsupported: Able to stand 2 minutes with supervision Sitting with Back Unsupported but Feet Supported on Floor or Stool: Able to sit safely and securely 2 minutes Stand to Sit: Controls descent by using hands Transfers: Able to transfer with verbal cueing and /or supervision Standing Unsupported with Eyes Closed: Able to stand 10 seconds with supervision Standing Ubsupported with Feet Together: Needs help to attain position and unable to hold for 15 seconds From Standing, Reach Forward with Outstretched Arm: Reaches forward but needs supervision From Standing Position, Pick up Object from Floor: Unable to try/needs assist to keep  balance From Standing Position, Turn to Look Behind Over each Shoulder: Needs supervision when turning Turn 360 Degrees: Needs close supervision or verbal cueing Standing Unsupported, Alternately  Place Feet on Step/Stool: Able to complete >2 steps/needs minimal assist Standing Unsupported, One Foot in Front: Loses balance while stepping or standing Standing on One Leg: Unable to try or needs assist to prevent fall Total Score: 22         Pertinent Vitals/Pain Pain Assessment: No/denies pain    Home Living Family/patient expects to be discharged to:: Private residence Living Arrangements: Spouse/significant other;Children Available Help at Discharge: Family;Available 24 hours/day Type of Home: Mobile home Home Access: Ramped entrance     Home Layout: One level Home Equipment: [place] - single point;Walker - 2 wheels      Prior Function Level of Independence: Independent with assistive device(s)               Hand Dominance        Extremity/Trunk Assessment   Upper Extremity Assessment: Overall WFL for tasks assessed           Lower Extremity Assessment: Overall WFL for tasks assessed (grossly at least 4/5 throughout; denies sensory deficit)         Communication   Communication: No difficulties  Cognition Arousal/Alertness: Awake/alert Behavior During Therapy: WFL for tasks assessed/performed Overall Cognitive Status: Within Functional Limits for tasks assessed                 General Comments: limited insight/awareness, limited higher-level comprehension and problem-solving    General Comments      Exercises        Assessment/Plan    PT Assessment Patient needs continued PT services  PT Diagnosis Difficulty walking;Generalized weakness   PT Problem List Decreased strength;Decreased range of motion;Decreased activity tolerance;Decreased balance;Decreased mobility;Decreased knowledge of use of DME;Decreased safety awareness;Decreased knowledge of precautions;Decreased cognition  PT Treatment Interventions DME instruction;Gait training;Stair training;Balance training;Therapeutic activities;Therapeutic exercise;Cognitive  remediation;Neuromuscular re-education;Patient/family education   PT Goals (Current goals can be found in the Care Plan section) Acute Rehab PT Goals Patient Stated Goal: to return home PT Goal Formulation: With patient Time For Goal Achievement: 04/28/15 Potential to Achieve Goals: Good Additional Goals Additional Goal #1: BERG >45/56 for decreased fall risk with functional mobility    Frequency Min 2X/week   Barriers to discharge        Co-evaluation               End of Session Equipment Utilized During Treatment: Gait belt Activity Tolerance: Patient tolerated treatment well Patient left: in chair;with call bell/phone within reach;with chair alarm set Nurse Communication: Mobility status    Functional Assessment Tool Used: clinical judgement, BERG balance assessment Functional Limitation: Mobility: Walking and moving around Mobility: Walking and Moving Around Current Status [phone]): At least 20 percent but less than 40 percent impaired, limited or restricted Mobility: Walking and Moving Around Goal Status [phone]): At least 1 percent but less than 20 percent impaired, limited or restricted    Time: 0913-0935 PT Time Calculation (min) (ACUTE ONLY): 22 min   Charges:   PT Evaluation $PT Eval Low Complexity: 1 Procedure PT Treatments $Therapeutic Activity: 8-22 mins   PT G Codes:   PT G-Codes **NOT FOR INPATIENT CLASS** Functional Assessment Tool Used: clinical judgement, BERG balance assessment Functional Limitation: Mobility: Walking and moving around Mobility: Walking and Moving Around Current Status [id number]): At least 20 percent but less than 40 percent impaired, limited or restricted Mobility:  Walking and Moving Around Goal Status [phone]): At least 1 percent but less than 20 percent impaired, limited or restricted    Edythe Riches H. Owens Shark, PT, DPT, NCS 04/14/2015, 10:43 AM [phone]

## 2015-04-14 NOTE — Care Management (Signed)
Patient recently discharged from hospital. Patient presents with AMS. Patient previous admission was diagnoses with right basilar acute infarct.  Patient lives at home with his wife.  Adult daughter is available for support.  Unable to reach either of them at this time to review medicare observation information or obtain further history.  PT is recommending Outpatient PT which was the previous recommendations today.

## 2015-04-14 NOTE — Progress Notes (Signed)
MRI tech called to see if MRI could be pushed until tomorrow due to large volumes of ED patients. Called Dr. Posey Pronto and it would be okay to have MRI done tomorrow. Attempted to call MRI back, but no one answered. Message left.

## 2015-04-14 NOTE — Progress Notes (Signed)
Spoke to his daughter she states he still has slurred speech not him self, obtain mri of brain

## 2015-04-14 NOTE — Care Management Obs Status (Signed)
[facility] NOTIFICATION   Patient Details  Name: Jason Aguilar MRN: [id number] Date of Birth: [birth year]   Medicare Observation Status Notification Given:  No Patient confused.  Unable to reach [facility], Illene Silver, RN 04/14/2015, 4:05 PM

## 2015-04-15 ENCOUNTER — Observation Stay: Payer: Medicare Other

## 2015-04-15 DIAGNOSIS — Z9889 Other specified postprocedural states: Secondary | ICD-10-CM | POA: Diagnosis not present

## 2015-04-15 DIAGNOSIS — G20 Parkinson's disease: Secondary | ICD-10-CM | POA: Diagnosis present

## 2015-04-15 DIAGNOSIS — G255 Other chorea: Secondary | ICD-10-CM | POA: Diagnosis present

## 2015-04-15 DIAGNOSIS — Z888 Allergy status to other drugs, medicaments and biological substances status: Secondary | ICD-10-CM | POA: Diagnosis not present

## 2015-04-15 DIAGNOSIS — Z833 Family history of diabetes mellitus: Secondary | ICD-10-CM | POA: Diagnosis not present

## 2015-04-15 DIAGNOSIS — H409 Unspecified glaucoma: Secondary | ICD-10-CM | POA: Diagnosis present

## 2015-04-15 DIAGNOSIS — N186 End stage renal disease: Secondary | ICD-10-CM | POA: Diagnosis present

## 2015-04-15 DIAGNOSIS — Z794 Long term (current) use of insulin: Secondary | ICD-10-CM | POA: Diagnosis not present

## 2015-04-15 DIAGNOSIS — R4182 Altered mental status, unspecified: Secondary | ICD-10-CM | POA: Diagnosis present

## 2015-04-15 DIAGNOSIS — I639 Cerebral infarction, unspecified: Secondary | ICD-10-CM | POA: Diagnosis not present

## 2015-04-15 DIAGNOSIS — K219 Gastro-esophageal reflux disease without esophagitis: Secondary | ICD-10-CM | POA: Diagnosis present

## 2015-04-15 DIAGNOSIS — I4891 Unspecified atrial fibrillation: Secondary | ICD-10-CM | POA: Diagnosis present

## 2015-04-15 DIAGNOSIS — E1122 Type 2 diabetes mellitus with diabetic chronic kidney disease: Secondary | ICD-10-CM | POA: Diagnosis present

## 2015-04-15 DIAGNOSIS — Z79899 Other long term (current) drug therapy: Secondary | ICD-10-CM | POA: Diagnosis not present

## 2015-04-15 DIAGNOSIS — Z9849 Cataract extraction status, unspecified eye: Secondary | ICD-10-CM | POA: Diagnosis not present

## 2015-04-15 DIAGNOSIS — D631 Anemia in chronic kidney disease: Secondary | ICD-10-CM | POA: Diagnosis present

## 2015-04-15 DIAGNOSIS — G9341 Metabolic encephalopathy: Secondary | ICD-10-CM | POA: Diagnosis present

## 2015-04-15 DIAGNOSIS — G4733 Obstructive sleep apnea (adult) (pediatric): Secondary | ICD-10-CM | POA: Diagnosis present

## 2015-04-15 DIAGNOSIS — Z8551 Personal history of malignant neoplasm of bladder: Secondary | ICD-10-CM | POA: Diagnosis not present

## 2015-04-15 DIAGNOSIS — Z8673 Personal history of transient ischemic attack (TIA), and cerebral infarction without residual deficits: Secondary | ICD-10-CM | POA: Diagnosis not present

## 2015-04-15 DIAGNOSIS — I120 Hypertensive chronic kidney disease with stage 5 chronic kidney disease or end stage renal disease: Secondary | ICD-10-CM | POA: Diagnosis present

## 2015-04-15 DIAGNOSIS — Z8249 Family history of ischemic heart disease and other diseases of the circulatory system: Secondary | ICD-10-CM | POA: Diagnosis not present

## 2015-04-15 DIAGNOSIS — Z992 Dependence on renal dialysis: Secondary | ICD-10-CM | POA: Diagnosis not present

## 2015-04-15 DIAGNOSIS — J449 Chronic obstructive pulmonary disease, unspecified: Secondary | ICD-10-CM | POA: Diagnosis present

## 2015-04-15 DIAGNOSIS — Z87891 Personal history of nicotine dependence: Secondary | ICD-10-CM | POA: Diagnosis not present

## 2015-04-15 DIAGNOSIS — N2581 Secondary hyperparathyroidism of renal origin: Secondary | ICD-10-CM | POA: Diagnosis present

## 2015-04-15 LAB — GLUCOSE, CAPILLARY
GLUCOSE-CAPILLARY: 130 mg/dL — AB (ref 65–99)
GLUCOSE-CAPILLARY: 160 mg/dL — AB (ref 65–99)
Glucose-Capillary: 136 mg/dL — ABNORMAL HIGH (ref 65–99)
Glucose-Capillary: 174 mg/dL — ABNORMAL HIGH (ref 65–99)

## 2015-04-15 LAB — PHOSPHORUS: Phosphorus: 3.8 mg/dL (ref 2.5–4.6)

## 2015-04-15 MED ORDER — LIDOCAINE-PRILOCAINE 2.5-2.5 % EX CREA
1.0000 "application " | TOPICAL_CREAM | CUTANEOUS | Status: DC | PRN
  Filled 2015-04-15: qty 5

## 2015-04-15 MED ORDER — HEPARIN SODIUM (PORCINE) 1000 UNIT/ML DIALYSIS
1000.0000 [IU] | INTRAMUSCULAR | Status: DC | PRN
  Filled 2015-04-15: qty 1

## 2015-04-15 MED ORDER — PENTAFLUOROPROP-TETRAFLUOROETH EX AERO
1.0000 | INHALATION_SPRAY | CUTANEOUS | Status: DC | PRN
Start: 2015-04-15 — End: 2015-04-15
  Filled 2015-04-15: qty 30

## 2015-04-15 MED ORDER — ALTEPLASE 2 MG IJ SOLR
2.0000 mg | Freq: Once | INTRAMUSCULAR | Status: DC | PRN
  Filled 2015-04-15: qty 2

## 2015-04-15 MED ORDER — SODIUM CHLORIDE 0.9 % IV SOLN: 100.0000 mL | INTRAVENOUS | Status: DC | PRN

## 2015-04-15 MED ORDER — LIDOCAINE HCL (PF) 1 % IJ SOLN
5.0000 mL | INTRAMUSCULAR | Status: DC | PRN
  Filled 2015-04-15: qty 5

## 2015-04-15 NOTE — Progress Notes (Signed)
Pre-hd tx 

## 2015-04-15 NOTE — Evaluation (Signed)
Clinical/Bedside Swallow Evaluation Patient Details  Name: Jason Aguilar MRN: [id number] Date of Birth: [birth year]  Today's Date: 04/15/2015 Time: SLP Start Time (ACUTE ONLY): 44 SLP Stop Time (ACUTE ONLY): 1515 SLP Time Calculation (min) (ACUTE ONLY): 60 min  Past Medical History:  Past Medical History  Diagnosis Date  . Glaucoma     both eyes Drs. Arrie [place] and [place]  . Hematuria syndrome     s/p negative cystoscopy  . GERD (gastroesophageal reflux disease) 2003    EGD  . Allergy   . Hyperlipidemia   . Obstructive sleep apnea of adult     CPAP at 8 cm H20  . Noncompliance with medication regimen   . 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 by [facility]: low grade noninvasive  . Chronic kidney disease (CKD), stage III (moderate)     with proteinuria  . Glaucoma   . Hypertension   . Diabetes mellitus     Type 2  . Cataract due to secondary diabetes mellitus ([place])   . COPD (chronic obstructive pulmonary disease) ([place])     secondary to tobacco abuse  . History of tobacco abuse     60 pack year history; quit 1980   Past Surgical History:  Past Surgical History  Procedure Laterality Date  . Elbow surgery    . Transurethral resection of bladder tumor  Mar 13 2011    [facility]  . Peripheral vascular catheterization N/A 01/03/2015    Procedure: Dialysis/Perma Catheter Insertion;  Surgeon: Katha Cabal, MD;  Location: [place] [facility];  Service: Cardiovascular;  Laterality: N/A;  . Skin graft    . Cataract extraction     HPI:  Pt is a 80 y.o. male with a known history of recent right basilar acute lacunar infarct, ESRD on hemodialysis, GERD, hypertension, DM, and other medical dxs comes to the emergency room brought in by family since patient was not his usual self and was more lethargic and sleepy. Patient had his dialysis today. During my evaluation patient is awake alert and oriented 3 answered all my questions very appropriately. Pt was awake, verbally  conversive and oriented x3(person, place, year). He stated he was brought to the hospital because he was not feeling well. Noted mild dysarthria of speech - unsure of pt's baseline articulation sec. to missing several teeth. Pt followed instruction w/ general verbal cues. Noted slight weakness in coordination of RUE as he fed self but was able to perform task. MRI of brain revealed small hemorrhagic strokes.   Assessment / Plan / Recommendation Clinical Impression  Pt appeared to adequately tolerate trials of thin liquids and puree/soft solids w/ no overt s/s of aspiration noted; no decline in respiratory status and no change in vocal quality noted. Pt exhibited adequate oral control of bolus and timely A-P transfer for swallowing; appropriate oral clearing noted. Slight lingual deviation to the R noted at rest but this did not appear to impact swallowing. Noted pt's speech was mildly dysarthric w/ certain speech sounds; intelligibility was appropriate during responses. Pt was A/O x3 making wants and needs known. Pt appeared HOH. Pt appears at reduced risk for aspiration following general aspiration and Reflux precautions; he has been swallowing pills w/ water appropriately per NSG report. Rec. ST services at discharge if dysarthria continues to be present in order to fully assess/tx as pt returns to his ADLs.    Aspiration Risk   (reduced following general aspiration precautions)    Diet Recommendation  Dys. 3/regular diet, thin liquids; aspiration and Reflux precautions; tray setup as needed   Medication Administration: Whole meds with liquid    Other  Recommendations Recommended Consults:  (TBD) Oral Care Recommendations: Oral care BID;Staff/trained caregiver to provide oral care   Follow up Recommendations  Home health SLP;Skilled Nursing facility (TBD; address dysarthria as indicated at d/c)    Frequency and Duration min 1 x/week  1 week       Prognosis Prognosis for Safe Diet  Advancement: Good      Swallow Study   General Date of Onset: 04/13/15 HPI: Pt is a 80 y.o. male with a known history of recent right basilar acute lacunar infarct, ESRD on hemodialysis, GERD, hypertension, DM, and other medical dxs comes to the emergency room brought in by family since patient was not his usual self and was more lethargic and sleepy. Patient had his dialysis today. During my evaluation patient is awake alert and oriented 3 answered all my questions very appropriately. Pt was awake, verbally conversive and oriented x3(person, place, year). He stated he was brought to the hospital because he was not feeling well. Noted mild dysarthria of speech - unsure of pt's baseline articulation sec. to missing several teeth. Pt followed instruction w/ general verbal cues. Noted slight weakness in coordination of RUE as he fed self but was able to perform task. MRI of brain revealed small hemorrhagic strokes. Type of Study: Bedside Swallow Evaluation Previous Swallow Assessment: none Diet Prior to this Study: Regular;Thin liquids Temperature Spikes Noted: No (wbc 9.3) Respiratory Status: Room air History of Recent Intubation: No Behavior/Cognition: Alert;Cooperative;Pleasant mood Oral Cavity Assessment: Within Functional Limits Oral Care Completed by SLP: Recent completion by staff Oral Cavity - Dentition: Missing dentition;Poor condition Vision: Functional for self-feeding Self-Feeding Abilities: Able to feed self;Needs set up Patient Positioning: Upright in bed Baseline Vocal Quality: Normal;Low vocal intensity Volitional Cough: Strong Volitional Swallow: Able to elicit    Oral/Motor/Sensory Function Overall Oral Motor/Sensory Function: Mild impairment Facial ROM: Within Functional Limits (grossly) Facial Symmetry: Within Functional Limits (grossly) Lingual ROM: Within Functional Limits Lingual Symmetry: Abnormal symmetry right (min. R deviation at rest) Lingual Strength: Within  Functional Limits Velum: Within Functional Limits Mandible: Within Functional Limits   Ice Chips Ice chips: Within functional limits Presentation: Spoon (fed; 3 trials)   Thin Liquid Thin Liquid: Within functional limits Presentation: Self Fed;Cup;Straw (~6 ozs total)    Nectar Thick Nectar Thick Liquid: Not tested   Honey Thick Honey Thick Liquid: Not tested   Puree Puree: Within functional limits Presentation: Self Fed;Spoon (5-6 trials)   Solid   GO   Solid: Within functional limits Presentation: Self Fed (5-6 trials)    Functional Assessment Tool Used: clinical judgement Functional Limitations: Swallowing Swallow Current Status [id number]): At least 1 percent but less than 20 percent impaired, limited or restricted Swallow Goal Status [phone]): At least 1 percent but less than 20 percent impaired, limited or restricted Swallow Discharge Status [phone]): At least 1 percent but less than 20 percent impaired, limited or restricted    Orinda Kenner, MS, CCC-SLP  Samel Bruna 04/15/2015,4:00 PM

## 2015-04-15 NOTE — Progress Notes (Signed)
Post hd tx 

## 2015-04-15 NOTE — Progress Notes (Signed)
Hemodialysis complete 

## 2015-04-15 NOTE — Plan of Care (Signed)
Problem: SLP Dysphagia Goals Goal: Misc Dysphagia Goal Pt will safely tolerate po diet of least restrictive consistency w/ no overt s/s of aspiration noted by Staff/pt/family x3 sessions.    

## 2015-04-15 NOTE — Progress Notes (Addendum)
Physical Therapy Treatment Patient Details Name: Jason Aguilar MRN: [id number] DOB: 09-11-1935 Today's Date: 04/15/2015    History of Present Illness Pt c AMS, lethargy; head CT (-), MRI (3/18) (+) subacute infarct after PT evaluation.  Of note, patient with recent hospitalziation (approx 2-3 weeks prior) with R lacunar infarct, AMS; discharged home with outpatient PT. At baseline, pt reports household distance AMB with QC for >1 year. Pt on HD T/R/Sa.     PT Comments    *Of note: since PT evaluation on 3/17, pt with new subacute CVA findings from MRI on 3/18. Given the timeline of symptoms, imaging, and PT services, these new CVA findings are thought to have been accounted for at initial evaluation, hence no reevaluation necessary at this time. Pt continues to demonstrate improved function overall since evaluation. POC has been updated to reflect these new findings.   Quick PT session today, as RN reports pt is soon leaving for HD. Pt tolerating treatment session well, motivated and able to complete entire PT sesssion as planned. Pt continues to present with flat affect, hypoactivity, and reportedly feels very weak and lethargic.  Pt continues to make progress toward goals as evidenced by increase in AMB distance with less AD. Pt's greatest limitation continues to be R knee buckling and generalized weakness, which continue to limit ability to perform ADL and household mobility at baseline function. Patient presenting with impairment of strength, pain, range of motion, balance, and activity tolerance, limiting ability to perform ADL and mobility tasks at  baseline level of function. Patient will benefit from skilled intervention to address the above impairments and limitations, in order to restore to prior level of function, improve patient safety upon discharge, and to decrease caregiver burden.    Follow Up Recommendations  Outpatient PT     Equipment Recommendations  Rolling walker with 5"  wheels    Recommendations for Other Services       Precautions / Restrictions Precautions Precautions: None Precaution Comments: R chest perm-cath    Mobility  Bed Mobility Overal bed mobility: Modified Independent                Transfers Overall transfer level: Modified independent Equipment used: None Transfers: Sit to/from Stand              Ambulation/Gait Ambulation/Gait assistance: Min guard Ambulation Distance (Feet): 350 Feet Assistive device: Straight cane   Gait velocity: 0.52m/s; performs household distances only at baseline.    General Gait Details: Fluent 2-point gait with SPC in RUE. pt report fatigue adn generalized weakness worsenign with distance, single episode of R knee buckling that requires minA from PT to avoid collapse to floor.    Stairs            Wheelchair Mobility    Modified Rankin (Stroke Patients Only)       Balance Overall balance assessment: Needs assistance         Standing balance support: During functional activity Standing balance-Leahy Scale: Fair Standing balance comment: knee buckling during prolonged gait.                     Cognition Arousal/Alertness: Lethargic Behavior During Therapy: Flat affect Overall Cognitive Status: Within Functional Limits for tasks assessed                      Exercises      General Comments        Pertinent Vitals/Pain Pain Assessment:  No/denies pain    Home Living                      Prior Function            PT Goals (current goals can now be found in the care plan section) Acute Rehab PT Goals Patient Stated Goal: to return home PT Goal Formulation: With patient Time For Goal Achievement: 04/28/15 Potential to Achieve Goals: Good Additional Goals Additional Goal #1: BERG >45/56 for decreased fall risk with functional mobility Progress towards PT goals: Progressing toward goals    Frequency  7X/week    PT Plan  Frequency needs to be updated;Current plan remains appropriate;Equipment recommendations need to be updated    Co-evaluation             End of Session Equipment Utilized During Treatment: Gait belt Activity Tolerance: Patient tolerated treatment well;Patient limited by fatigue;Patient limited by lethargy Patient left: with chair alarm set;in bed;with bed alarm set     Time: [id number] PT Time Calculation (min) (ACUTE ONLY): 13 min  Charges:  $Therapeutic Activity: 8-22 mins                    G Codes:     4:05 PM, 05/08/2015 Etta Grandchild, PT, DPT PRN Physical Therapist - [place] License # [id number] [phone] (mobile)

## 2015-04-15 NOTE — Progress Notes (Signed)
Patient ID: Jason Aguilar, male   DOB: 1935-09-14, 80 y.o.   MRN: [id number] [facility] PROGRESS NOTE  Jason Aguilar [id number] DOB: 1935/11/29 DOA: 04/13/2015 PCP: Jason Castle, MD  HPI/Subjective: Patient asked me when he can go home.  MRI brain came back with small hemorrhagic strokes. Patient does feel weak on the right upper extremity. He did not realize that he has some slurred speech.  Objective: Filed Vitals:   04/15/15 0446 04/15/15 1256  BP: 138/54 142/45  Pulse: 51 54  Temp: 97.8 F (36.6 C) 97.8 F (36.6 C)  Resp: 20 16    Filed Weights   04/13/15 1724 04/14/15 0123 04/15/15 0508  Weight: 63.504 kg (140 lb) 59.376 kg (130 lb 14.4 oz) 60.51 kg (133 lb 6.4 oz)    ROS: Review of Systems  Constitutional: Negative for fever and chills.  Eyes: Negative for blurred vision.  Respiratory: Negative for cough and shortness of breath.   Cardiovascular: Negative for chest pain.  Gastrointestinal: Negative for nausea, vomiting, abdominal pain, diarrhea and constipation.  Genitourinary: Negative for dysuria.  Musculoskeletal: Negative for joint pain.  Neurological: Positive for focal weakness and weakness. Negative for dizziness and headaches.   Exam: Physical Exam  Constitutional: He is oriented to person, place, and time.  HENT:  Nose: No mucosal edema.  Mouth/Throat: No oropharyngeal exudate or posterior oropharyngeal edema.  Eyes: Conjunctivae, EOM and lids are normal. Pupils are equal, round, and reactive to light.  Neck: No JVD present. Carotid bruit is not present. No edema present. No thyroid mass and no thyromegaly present.  Cardiovascular: S1 normal and S2 normal.  Exam reveals no gallop.   No murmur heard. Pulses:      Dorsalis pedis pulses are 2+ on the right side, and 2+ on the left side.  Respiratory: No respiratory distress. He has no wheezes. He has no rhonchi. He has no rales.  GI: Soft. Bowel sounds are normal. There is no  tenderness.  Musculoskeletal:       Right ankle: He exhibits no swelling.       Left ankle: He exhibits no swelling.  Lymphadenopathy:    He has no cervical adenopathy.  Neurological: He is alert and oriented to person, place, and time.  Patient with slurred speech and left facial droop. 4 out of 5 weakness on the right upper extremity. Other extremities 5 out of 5 power. Sensation intact to light touch on face bilaterally  Skin: Skin is warm. No rash noted. Nails show no clubbing.  Psychiatric: He has a normal mood and affect.      Data Reviewed: Basic Metabolic Panel:  Recent Labs Lab 04/13/15 1833 04/14/15 0346  NA 135  --   K 3.5  --   CL 102  --   CO2 26  --   GLUCOSE 188*  --   BUN 10  --   CREATININE 2.04* 2.97*  CALCIUM 9.2  --    CBC:  Recent Labs Lab 04/13/15 1833 04/14/15 0346  WBC 11.5* 9.3  NEUTROABS 9.9*  --   HGB 10.1* 9.8*  HCT 30.6* 29.0*  MCV 91.4 89.7  PLT 234 227   Cardiac Enzymes:  Recent Labs Lab 04/13/15 1833  TROPONINI <0.03   BNP (last 3 results)  Recent Labs  01/02/15 1924  BNP 1454.0*   CBG:  Recent Labs Lab 04/14/15 1147 04/14/15 1634 04/14/15 2204 04/15/15 0734 04/15/15 1122  GLUCAP 228* 87 153* 130* 136*  Studies: Ct Head Wo Contrast  04/13/2015  CLINICAL DATA:  Altered mental status, lethargy after dialysis. Treated for stroke last week. EXAM: CT HEAD WITHOUT CONTRAST TECHNIQUE: Contiguous axial images were obtained from the base of the skull through the vertex without intravenous contrast. COMPARISON:  Head CT dated 03/28/2015 and brain MRI dated 03/30/2015. FINDINGS: Again noted is generalized brain atrophy with commensurate dilatation of the ventricles and sulci. Confluent areas of low density are again seen throughout the bilateral periventricular and subcortical white matter regions compatible with changes of chronic small vessel ischemia. There is no mass, hemorrhage, edema or other evidence of acute  parenchymal abnormality. No extra-axial hemorrhage. Osseous structures are unremarkable. IMPRESSION: No acute findings. No intracranial mass, hemorrhage or edema. Extensive chronic small vessel ischemic change throughout the cerebral white matter, stable compared to earlier head CT. Electronically Signed   By: Franki Cabot M.D.   On: 04/13/2015 19:29   Mr Brain Wo Contrast  04/15/2015  CLINICAL DATA:  Altered mental status and lethargy. Diabetes. Dialysis patient. Recent stroke. EXAM: MRI HEAD WITHOUT CONTRAST TECHNIQUE: Multiplanar, multiecho pulse sequences of the brain and surrounding structures were obtained without intravenous contrast. COMPARISON:  CT head 04/13/2015.  MRI  03/30/2015 FINDINGS: Resolving areas of acute infarct in the putamen and internal capsule bilaterally. These areas show low-level restricted diffusion with interval improvement. Small area of restricted diffusion left thalamus is new. This shows low level restriction and may represent acute or subacute infarction. No other areas of acute infarct. Susceptibility is present in the putamen bilaterally. This shows T1 shortening compatible with methemoglobin. Findings compatible with hemorrhagic infarction which is relatively symmetric. Given location and symmetry, hypoglycemia and hypoperfusion are likely candidates. Methemoglobin has progressed since the prior study. Prior study was significantly degraded by motion. Generalized atrophy. Moderate chronic microvascular ischemic change in the white matter bilaterally. Negative for mass lesion. No shift of the midline structure. Right vertebral artery occluded distally as noted on prior MRA. Mucosal edema in the paranasal sinuses.  No air-fluid level. IMPRESSION: Subacute hemorrhagic infarct in the putamen bilaterally. Possible etiologies include hypoperfusion and hypoglycemia. Low-level restricted diffusion in the left thalamus has developed since MRI of 03/30/2015 compatible with subacute  infarct. Atrophy and chronic ischemia throughout the white matter. Mucosal edema throughout the paranasal sinuses. Electronically Signed   By: Franchot Gallo M.D.   On: 04/15/2015 11:39    Scheduled Meds: . amLODipine  5 mg Oral Daily  . aspirin  81 mg Oral Daily  . calcium acetate  1,334 mg Oral TID WC  . carbidopa-levodopa  2 tablet Oral BID  . cloNIDine  0.1 mg Oral BID  . doxazosin  8 mg Oral QHS  . heparin  5,000 Units Subcutaneous 3 times per day  . hydrALAZINE  100 mg Oral TID  . insulin aspart  0-9 Units Subcutaneous TID WC  . insulin aspart  2 Units Subcutaneous TID AC  . insulin detemir  20 Units Subcutaneous Daily  . metoprolol  50 mg Oral BID  . QUEtiapine  25 mg Oral BID  . simvastatin  40 mg Oral QHS  . sodium bicarbonate  650 mg Oral BID    Assessment/Plan:  1. Acute hemorrhagic strokes, subacute stroke. Case discussed with neurology Dr. Doy Mince to see the patient in consultation. She recommended an MRA of the brain to evaluate posterior circulation. Stop Plavix for right now. Continue aspirin and simvastatin. We'll get PT OT and speech therapy consultations also. Put on telemetry. Patient had  previous echocardiogram and carotid ultrasound. 2. End-stage renal disease continue dialysis as per nephrology. 3. Essential hypertension-patient on numerous blood pressure medications. Rather her blood pressure a little high than too low. 4. Type 2 diabetes continue Levemir insulin and sliding scale 5. Parkinson's disease on Sinemet 6. Weakness- we'll get physical therapy evaluation  Code Status:     Code Status Orders        Start     Ordered   04/14/15 0118  Full code   Continuous     04/14/15 0117    Code Status History    Date Active Date Inactive Code Status Order ID Comments User Context   03/29/2015 12:42 AM 04/05/2015  8:06 PM Full Code [id number]  Lance Coon, MD Inpatient   01/02/2015  9:42 PM 01/06/2015  6:51 PM Full Code [id number]  Lytle Butte, MD ED      Family Communication: Spoke with the wife on the phone Disposition Plan: To be determined based on how he does with physical therapy  Consultants:  Neurology  Time spent: 30 minutes  [place], [place] [facility]

## 2015-04-15 NOTE — Progress Notes (Signed)
Hemodialysis start 
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
## 2015-04-16 ENCOUNTER — Inpatient Hospital Stay: Payer: Medicare Other

## 2015-04-16 LAB — GLUCOSE, CAPILLARY
GLUCOSE-CAPILLARY: 172 mg/dL — AB (ref 65–99)
Glucose-Capillary: 156 mg/dL — ABNORMAL HIGH (ref 65–99)
Glucose-Capillary: 129 mg/dL — ABNORMAL HIGH (ref 65–99)
Glucose-Capillary: 106 mg/dL — ABNORMAL HIGH (ref 65–99)

## 2015-04-16 LAB — PARATHYROID HORMONE, INTACT (NO CA): PTH: 198 pg/mL — ABNORMAL HIGH (ref 15–65)

## 2015-04-16 NOTE — Progress Notes (Signed)
Physical Therapy Treatment Patient Details Name: PRINTES LUTSKY MRN: [id number] DOB: 1935-12-01 Today's Date: 04/16/2015    History of Present Illness Pt c AMS, lethargy; head CT (-), MRI (3/18) (+) subacute infarct after PT evaluation per MRI note, and neuro note from later yesterday identifies an acute lesion as well.  Of note, patient with recent hospitalziation (approx 2-3 weeks prior) with R lacunar infarct, AMS; discharged home with outpatient PT. At baseline, pt reports household distance AMB with QC for >1 year. Pt on HD T/R/Sa. MRA pending..    PT Comments    Pt appears to have declines since yesterday. RN reports he was more alert prior to lunch, but since has been quite somnolent. Pt remain oriented x4, and follows commands consistently, but has trouble maintaining eyes open. Dysarthria is 25-50% worse today as well. Pt reports he simply feels as though he is unable to wake up. Ambulation is decreased in distance by nearly 50% since yesterday, gait quality has decreased 25%, and AD sequencing is completely compromised. R knee pain and activity tolerance continues to be a limiting factor after sustained activity. Pt remains motivated and agreeable to PT activity but loses confidence after episode of knee buckling. Will continue to monitor remotely and treat as appropriate. DC recommendations have been updated to STR/SNF.   Follow Up Recommendations  SNF     Equipment Recommendations  Rolling walker with 5" wheels    Recommendations for Other Services       Precautions / Restrictions Precautions Precautions: Fall Precaution Comments: R chest perm-cath    Mobility  Bed Mobility Overal bed mobility: Modified Independent                Transfers Overall transfer level: Modified independent   Transfers: Sit to/from Stand           General transfer comment: Also performed 10x STS with HHA for motor control training. Good demonstration of approrpiate weight shift,  midl assymetry noted in latter half of set.   Ambulation/Gait Ambulation/Gait assistance: Min assist Ambulation Distance (Feet): 180 Feet Assistive device: Quad cane Gait Pattern/deviations: Narrow base of support;Staggering right;Staggering left     General Gait Details: Gait is more 'drunken' today with mildly poor velocity control, more lateral sway and scissoring, and asynchronous use of QC, which is carried more than is utilized for UE support. When asked how he is feeling, the pt reports 'I don't know' and later 'I need to sit down.' Second attempt is made, but patient  immediately sustains R knee buckling upon standing falling back onto bed. A second transfers is successful, but the patient is now limping , painful, and has lost confidence. Session is halted,   Stairs            Wheelchair Mobility    Modified Rankin (Stroke Patients Only)       Balance Overall balance assessment: Needs assistance Sitting-balance support: No upper extremity supported;Feet supported Sitting balance-Leahy Scale: Good     Standing balance support: During functional activity Standing balance-Leahy Scale: Poor Standing balance comment: Loss of AD sequencing since yesterday, increased postural sway.                     Cognition Arousal/Alertness: Lethargic Behavior During Therapy: Flat affect Overall Cognitive Status: Within Functional Limits for tasks assessed Area of Impairment: Safety/judgement;Awareness Orientation Level: Person;Place;Time;Situation     Following Commands: Follows multi-step commands consistently       General Comments: More somnolent today,  gait more impaired.     Exercises      General Comments        Pertinent Vitals/Pain Pain Assessment: No/denies pain    Home Living                      Prior Function            PT Goals (current goals can now be found in the care plan section) Acute Rehab PT Goals Patient Stated Goal: to  return home PT Goal Formulation: With patient Potential to Achieve Goals: Fair Additional Goals Additional Goal #1: BERG >45/56 for decreased fall risk with functional mobility Progress towards PT goals: Not progressing toward goals - comment    Frequency  7X/week    PT Plan Current plan remains appropriate;Discharge plan needs to be updated    Co-evaluation             End of Session Equipment Utilized During Treatment: Gait belt Activity Tolerance: Patient tolerated treatment well;Patient limited by lethargy;Patient limited by pain Patient left: in bed;with bed alarm set;with call bell/phone within reach     Time: 1511-1526 PT Time Calculation (min) (ACUTE ONLY): 15 min  Charges:  $Therapeutic Activity: 8-22 mins                    G Codes:      3:48 PM, 04-21-2015 Etta Grandchild, PT, DPT PRN Physical Therapist - [place] License # [id number] [phone] (mobile)

## 2015-04-16 NOTE — Care Management Important Message (Signed)
Important Message  Patient Details  Name: Jason Aguilar MRN: [id number] Date of Birth: May 14, 1935   Medicare Important Message Given:  Yes    Jaceon Heiberger A, RN 04/16/2015, 2:28 PM

## 2015-04-16 NOTE — Progress Notes (Signed)
Patient ID: Jason Aguilar, male   DOB: 18-May-1935, 80 y.o.   MRN: [id number] [facility] PROGRESS NOTE  Jason Aguilar [id number] DOB: 01-02-1936 DOA: 04/13/2015 PCP: Valera Castle, MD  HPI/Subjective: Patient more lethargic today. I was able to wake him up and he did answer some yes or no questions. He just stated that he start.  Objective: Filed Vitals:   04/16/15 1300 04/16/15 1336  BP: 101/37 132/98  Pulse:  58  Temp:  98.1 F (36.7 C)  Resp:  18    Filed Weights   04/15/15 0508 04/15/15 1636 04/15/15 1949  Weight: 60.51 kg (133 lb 6.4 oz) 60.328 kg (133 lb) 58.4 kg (128 lb 12 oz)    ROS: Review of Systems  Constitutional: Positive for malaise/fatigue. Negative for fever and chills.  Eyes: Negative for blurred vision.  Respiratory: Negative for cough and shortness of breath.   Cardiovascular: Negative for chest pain.  Gastrointestinal: Negative for nausea, vomiting, abdominal pain, diarrhea and constipation.  Genitourinary: Negative for dysuria.  Musculoskeletal: Negative for joint pain.  Neurological: Positive for weakness. Negative for dizziness and headaches.   Exam: Physical Exam  Constitutional: He is oriented to person, place, and time.  HENT:  Nose: No mucosal edema.  Mouth/Throat: No oropharyngeal exudate or posterior oropharyngeal edema.  Eyes: Conjunctivae, EOM and lids are normal. Pupils are equal, round, and reactive to light.  Neck: No JVD present. Carotid bruit is not present. No edema present. No thyroid mass and no thyromegaly present.  Cardiovascular: S1 normal and S2 normal.  Exam reveals no gallop.   No murmur heard. Pulses:      Dorsalis pedis pulses are 2+ on the right side, and 2+ on the left side.  Respiratory: No respiratory distress. He has no wheezes. He has no rhonchi. He has no rales.  GI: Soft. Bowel sounds are normal. There is no tenderness.  Musculoskeletal:       Right ankle: He exhibits no swelling.        Left ankle: He exhibits no swelling.  Lymphadenopathy:    He has no cervical adenopathy.  Neurological: He is alert and oriented to person, place, and time.  Patient with slurred speech and left facial droop. 4 out of 5 weakness on the right upper extremity. Other extremities 5 out of 5 power. Sensation intact to light touch on face bilaterally  Skin: Skin is warm. No rash noted. Nails show no clubbing.  Psychiatric: He has a normal mood and affect.      Data Reviewed: Basic Metabolic Panel:  Recent Labs Lab 04/13/15 1833 04/14/15 0346 04/15/15 1614  NA 135  --   --   K 3.5  --   --   CL 102  --   --   CO2 26  --   --   GLUCOSE 188*  --   --   BUN 10  --   --   CREATININE 2.04* 2.97*  --   CALCIUM 9.2  --   --   PHOS  --   --  3.8   CBC:  Recent Labs Lab 04/13/15 1833 04/14/15 0346  WBC 11.5* 9.3  NEUTROABS 9.9*  --   HGB 10.1* 9.8*  HCT 30.6* 29.0*  MCV 91.4 89.7  PLT 234 227   Cardiac Enzymes:  Recent Labs Lab 04/13/15 1833  TROPONINI <0.03   BNP (last 3 results)  Recent Labs  01/02/15 1924  BNP 1454.0*   CBG:  Recent  Labs Lab 04/15/15 1122 04/15/15 1626 04/15/15 2103 04/16/15 0742 04/16/15 1141  GLUCAP 136* 174* 160* 156* 172*    Studies: Mr Brain Wo Contrast  04/15/2015  CLINICAL DATA:  Altered mental status and lethargy. Diabetes. Dialysis patient. Recent stroke. EXAM: MRI HEAD WITHOUT CONTRAST TECHNIQUE: Multiplanar, multiecho pulse sequences of the brain and surrounding structures were obtained without intravenous contrast. COMPARISON:  CT head 04/13/2015.  MRI  03/30/2015 FINDINGS: Resolving areas of acute infarct in the putamen and internal capsule bilaterally. These areas show low-level restricted diffusion with interval improvement. Small area of restricted diffusion left thalamus is new. This shows low level restriction and may represent acute or subacute infarction. No other areas of acute infarct. Susceptibility is present in the  putamen bilaterally. This shows T1 shortening compatible with methemoglobin. Findings compatible with hemorrhagic infarction which is relatively symmetric. Given location and symmetry, hypoglycemia and hypoperfusion are likely candidates. Methemoglobin has progressed since the prior study. Prior study was significantly degraded by motion. Generalized atrophy. Moderate chronic microvascular ischemic change in the white matter bilaterally. Negative for mass lesion. No shift of the midline structure. Right vertebral artery occluded distally as noted on prior MRA. Mucosal edema in the paranasal sinuses.  No air-fluid level. IMPRESSION: Subacute hemorrhagic infarct in the putamen bilaterally. Possible etiologies include hypoperfusion and hypoglycemia. Low-level restricted diffusion in the left thalamus has developed since MRI of 03/30/2015 compatible with subacute infarct. Atrophy and chronic ischemia throughout the white matter. Mucosal edema throughout the paranasal sinuses. Electronically Signed   By: Franchot Gallo M.D.   On: 04/15/2015 11:39    Scheduled Meds: . amLODipine  5 mg Oral Daily  . aspirin  81 mg Oral Daily  . calcium acetate  1,334 mg Oral TID WC  . cloNIDine  0.1 mg Oral BID  . doxazosin  8 mg Oral QHS  . heparin  5,000 Units Subcutaneous 3 times per day  . hydrALAZINE  100 mg Oral TID  . insulin aspart  0-9 Units Subcutaneous TID WC  . insulin aspart  2 Units Subcutaneous TID AC  . insulin detemir  20 Units Subcutaneous Daily  . metoprolol  50 mg Oral BID  . QUEtiapine  25 mg Oral BID  . simvastatin  40 mg Oral QHS  . sodium bicarbonate  650 mg Oral BID    Assessment/Plan:  1. Acute hemorrhagic strokes, subacute stroke.  She recommended an MRA of the brain to evaluate posterior circulation. Stop Plavix for right now. Continue aspirin and simvastatin. We'll get PT OT and speech therapy consultations also. Put on telemetry. Patient had previous echocardiogram and carotid  ultrasound. 2. End-stage renal disease continue dialysis as per nephrology. 3. Essential hypertension-patient on numerous blood pressure medications. Rather her blood pressure a little high than too low. 4. Type 2 diabetes continue Levemir insulin and sliding scale 5. Parkinson's disease on Sinemet 6. Weakness- physical therapy evaluation  Code Status:     Code Status Orders        Start     Ordered   04/14/15 0118  Full code   Continuous     04/14/15 0117    Code Status History    Date Active Date Inactive Code Status Order ID Comments User Context   03/29/2015 12:42 AM 04/05/2015  8:06 PM Full Code [id number]  Lance Coon, MD Inpatient   01/02/2015  9:42 PM 01/06/2015  6:51 PM Full Code [id number]  Lytle Butte, MD ED     Family Communication: Spoke with  the Daughter on phone Disposition Plan: To be determined based on how he does with physical therapy  Consultants:  Neurology  Time spent: 23 minutes  [place], [place] [facility]

## 2015-04-16 NOTE — Care Management Note (Addendum)
Case Management Note  Patient Details  Name: Jason Aguilar MRN: [id number] Date of Birth: 1935-10-28  Subjective/Objective:   [facility]-PT was recommending HHPT yesterday prior to dialysiis therapy. Dr Writing saw [name] and he was significantly lethargic and less responsive..Now [facility]-PT is recommending SNF.                  Action/Plan:   Expected Discharge Date:                  Expected Discharge Plan:     In-House Referral:     Discharge planning Services     Post Acute Care Choice:    Choice offered to:     DME Arranged:    DME Agency:     HH Arranged:    [place] Agency:     Status of Service:     Medicare Important Message Given:  Yes Date Medicare IM Given:    Medicare IM give by:    Date Additional Medicare IM Given:    Additional Medicare Important Message give by:     If discussed at [place] of Stay Meetings, dates discussed:    Additional Comments:  Gokul Waybright A, RN 04/16/2015, 4:16 PM

## 2015-04-16 NOTE — Progress Notes (Signed)
[facility]  ROUNDING NOTE   Subjective:  Pt completed HD yesterday. Resting comfortably in bed. Oriented to self and place.   Objective:  Vital signs in last 24 hours:  Temp:  [97.8 F (36.6 C)-98.1 F (36.7 C)] 98.1 F (36.7 C) (03/19 1336) Pulse Rate:  [56-78] 64 (03/19 1534) Resp:  [10-20] 18 (03/19 1336) BP: (101-172)/(34-98) 132/98 mmHg (03/19 1336) SpO2:  [90 %-99 %] 99 % (03/19 1336) Weight:  [58.4 kg (128 lb 12 oz)-60.328 kg (133 lb)] 58.4 kg (128 lb 12 oz) (03/18 1949)  Weight change: -0.182 kg (-6.4 oz) Filed Weights   04/15/15 0508 04/15/15 1636 04/15/15 1949  Weight: 60.51 kg (133 lb 6.4 oz) 60.328 kg (133 lb) 58.4 kg (128 lb 12 oz)    Intake/Output: I/O last 3 completed shifts: In: 240 [P.O.:240] Out: 2125 [Urine:625; Other:1500]   Intake/Output this shift:  Total I/O In: 240 [P.O.:240] Out: -   Physical Exam: General: NAD, resting in bed  Head:  Moist oral mucosal membranes  Eyes: Anicteric  Neck: Supple, trachea midline  Lungs:  Clear to auscultation, normal effort  Heart: Regular rate and rhythm no rubs  Abdomen:  Soft, nontender, BS present  Extremities: No peripheral edema.  Neurologic: Awake, alert, following simple commands  Skin: No lesions  Access: R IJ permcath    Basic Metabolic Panel:  Recent Labs Lab 04/13/15 1833 04/14/15 0346 04/15/15 1614  NA 135  --   --   K 3.5  --   --   CL 102  --   --   CO2 26  --   --   GLUCOSE 188*  --   --   BUN 10  --   --   CREATININE 2.04* 2.97*  --   CALCIUM 9.2  --   --   PHOS  --   --  3.8    Liver Function Tests: No results for input(s): AST, ALT, ALKPHOS, BILITOT, PROT, ALBUMIN in the last 168 hours. No results for input(s): LIPASE, AMYLASE in the last 168 hours. No results for input(s): AMMONIA in the last 168 hours.  CBC:  Recent Labs Lab 04/13/15 1833 04/14/15 0346  WBC 11.5* 9.3  NEUTROABS 9.9*  --   HGB 10.1* 9.8*  HCT 30.6* 29.0*  MCV 91.4 89.7  PLT  234 227    Cardiac Enzymes:  Recent Labs Lab 04/13/15 1833  TROPONINI <0.03    BNP: Invalid input(s): POCBNP  CBG:  Recent Labs Lab 04/15/15 1122 04/15/15 1626 04/15/15 2103 04/16/15 0742 04/16/15 1141  GLUCAP 136* 174* 160* 156* 172*    Microbiology: Results for orders placed or performed during the hospital encounter of 03/03/15  Surgical pcr screen     Status: Abnormal   Collection Time: 03/03/15  2:39 PM  Result Value Ref Range Status   MRSA, PCR NEGATIVE NEGATIVE Final   Staphylococcus aureus POSITIVE (A) NEGATIVE Final    Coagulation Studies: No results for input(s): LABPROT, INR in the last 72 hours.  Urinalysis:  Recent Labs  04/13/15 2145  COLORURINE YELLOW*  LABSPEC 1.012  PHURINE 6.0  GLUCOSEU >500*  HGBUR NEGATIVE  BILIRUBINUR NEGATIVE  KETONESUR TRACE*  PROTEINUR >500*  NITRITE NEGATIVE  LEUKOCYTESUR TRACE*      Imaging: Mr Virgel Paling Wo Contrast  04/16/2015  CLINICAL DATA:  Stroke. EXAM: MRA HEAD WITHOUT CONTRAST TECHNIQUE: Angiographic images of the Circle of Willis were obtained using MRA technique without intravenous contrast. COMPARISON:  MRA 04/03/2015.  MRI  04/15/2015 FINDINGS: Distal right vertebral artery not visualized and may be hypoplastic or end in PICA. This is unchanged from the prior study. Left vertebral artery patent to the basilar. PICA patent on the left but not visualized on the right. Basilar widely patent. Superior cerebellar and posterior cerebral arteries patent without significant stenosis. Patent right posterior communicating artery. Severe tandem stenosis right cavernous carotid unchanged. Post stenotic dilatation supraclinoid internal carotid artery on the right unchanged. Hypoplastic right A1 segment unchanged. Moderate stenosis right M1 segment with scattered atherosclerotic disease right middle cerebral artery branches Moderate stenosis left cavernous carotid. Mild poststenotic dilatation left supraclinoid internal  carotid artery. Severe stenosis left MCA bifurcation unchanged from the prior study. Multiple areas of branch stenosis left middle cerebral artery. Both anterior cerebral arteries supplied from the left and appear patent. Negative for cerebral aneurysm. IMPRESSION: Distal right vertebral and not visualized probably hypoplastic Severe stenosis right cavernous carotid and moderate stenosis left cavernous carotid. Bilateral MCA disease. No change from the prior study. Electronically Signed   By: Franchot Gallo M.D.   On: 04/16/2015 15:36   Mr Brain Wo Contrast  04/15/2015  CLINICAL DATA:  Altered mental status and lethargy. Diabetes. Dialysis patient. Recent stroke. EXAM: MRI HEAD WITHOUT CONTRAST TECHNIQUE: Multiplanar, multiecho pulse sequences of the brain and surrounding structures were obtained without intravenous contrast. COMPARISON:  CT head 04/13/2015.  MRI  03/30/2015 FINDINGS: Resolving areas of acute infarct in the putamen and internal capsule bilaterally. These areas show low-level restricted diffusion with interval improvement. Small area of restricted diffusion left thalamus is new. This shows low level restriction and may represent acute or subacute infarction. No other areas of acute infarct. Susceptibility is present in the putamen bilaterally. This shows T1 shortening compatible with methemoglobin. Findings compatible with hemorrhagic infarction which is relatively symmetric. Given location and symmetry, hypoglycemia and hypoperfusion are likely candidates. Methemoglobin has progressed since the prior study. Prior study was significantly degraded by motion. Generalized atrophy. Moderate chronic microvascular ischemic change in the white matter bilaterally. Negative for mass lesion. No shift of the midline structure. Right vertebral artery occluded distally as noted on prior MRA. Mucosal edema in the paranasal sinuses.  No air-fluid level. IMPRESSION: Subacute hemorrhagic infarct in the putamen  bilaterally. Possible etiologies include hypoperfusion and hypoglycemia. Low-level restricted diffusion in the left thalamus has developed since MRI of 03/30/2015 compatible with subacute infarct. Atrophy and chronic ischemia throughout the white matter. Mucosal edema throughout the paranasal sinuses. Electronically Signed   By: Franchot Gallo M.D.   On: 04/15/2015 11:39     Medications:     . amLODipine  5 mg Oral Daily  . aspirin  81 mg Oral Daily  . calcium acetate  1,334 mg Oral TID WC  . cloNIDine  0.1 mg Oral BID  . doxazosin  8 mg Oral QHS  . heparin  5,000 Units Subcutaneous 3 times per day  . hydrALAZINE  100 mg Oral TID  . insulin aspart  0-9 Units Subcutaneous TID WC  . insulin aspart  2 Units Subcutaneous TID AC  . insulin detemir  20 Units Subcutaneous Daily  . metoprolol  50 mg Oral BID  . QUEtiapine  25 mg Oral BID  . simvastatin  40 mg Oral QHS  . sodium bicarbonate  650 mg Oral BID   acetaminophen **OR** acetaminophen, ALPRAZolam, ondansetron **OR** ondansetron (ZOFRAN) IV  Assessment/ Plan:  80 y.o. 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 dialysis center by [facility] nephrology, hypertension, diabetes mellitus type 2, COPD who presented with altered mental status after trying to jump out of a moving car.  [facility]  1.  ESRD on HD TTHS:  Pt completed HD yesterday, no acute indication for HD today, will plan for HD again on Tuesday.  2.  Anemia of CKD: Hold epogen given recent CVA, hgb 9.8 at the moment.   3.  Secondary Hyperparathyroidism:  Phos 3.8 and acceptable, continue to monitor.  4. Diabetes Mellitus type II with chronic kidney disease: Most recent A1c was quite high at 11.2. Management per hospitalist.  5. Hypertension: Blood pressure  currently 132/98 -  Continue Amlodipine, clonidine, doxazosin, hydralazine, metoprolol   LOS:  Antonin Meininger 3/19/20173:46 PM

## 2015-04-16 NOTE — Consult Note (Addendum)
Referring Physician: Leslye Peer    Chief Complaint: Lethargy  HPI: Jason Aguilar is an 80 y.o. male with recent hospitalization for acute infarcts and resultant chorea who now presents after being brought in by family for being more lethargic and sleepy.  Patient unable to give any clear history about what has been going on.  Chorea controlled with medications.  Family unavailable for further history.    Date last known well: Unable to determine Time last known well: Unable to determine tPA Given: No: Unable to determine LKW  Past Medical History  Diagnosis Date  . Glaucoma     both eyes Drs. Arrie [place] and [place]  . Hematuria syndrome     s/p negative cystoscopy  . GERD (gastroesophageal reflux disease) 2003    EGD  . Allergy   . Hyperlipidemia   . Obstructive sleep apnea of adult     CPAP at 8 cm H20  . Noncompliance with medication regimen   . 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 by [facility]: low grade noninvasive  . Chronic kidney disease (CKD), stage III (moderate)     with proteinuria  . Glaucoma   . Hypertension   . Diabetes mellitus     Type 2  . Cataract due to secondary diabetes mellitus ([place])   . COPD (chronic obstructive pulmonary disease) ([place])     secondary to tobacco abuse  . History of tobacco abuse     60 pack year history; quit 1980    Past Surgical History  Procedure Laterality Date  . Elbow surgery    . Transurethral resection of bladder tumor  Mar 13 2011    [facility]  . Peripheral vascular catheterization N/A 01/03/2015    Procedure: Dialysis/Perma Catheter Insertion;  Surgeon: Katha Cabal, MD;  Location: [facility];  Service: Cardiovascular;  Laterality: N/A;  . Skin graft    . Cataract extraction      Family History  Problem Relation Age of Onset  . Diabetes Mother   . Heart disease Father    Social History:  reports that he quit smoking about 34 years ago. He has never used smokeless tobacco. He reports that he  drinks about 1.8 oz of alcohol per week. He reports that he does not use illicit drugs.  Allergies:  Allergies  Allergen Reactions  . Losartan Swelling  . Dristan Rash    Medications:  I have reviewed the patient's current medications. Prior to Admission:  Prescriptions prior to admission  Medication Sig Dispense Refill Last Dose  . ALPRAZolam (XANAX) 0.25 MG tablet Take 0.25 mg by mouth at bedtime as needed for sleep.   04/13/2015 at Unknown time  . amLODipine (NORVASC) 5 MG tablet Take 5 mg by mouth daily.   04/13/2015 at AM  . aspirin 81 MG chewable tablet Chew 1 tablet (81 mg total) by mouth daily. 30 tablet 0 04/13/2015 at AM  . calcium acetate (PHOSLO) 667 MG capsule Take 2 capsules (1,334 mg total) by mouth 3 (three) times daily with meals. 180 capsule 2 04/13/2015 at Unknown time  . carbidopa-levodopa (SINEMET IR) 25-100 MG tablet Take 2 tablets by mouth 2 (two) times daily. 30 tablet 0 04/13/2015 at Unknown time  . cloNIDine (CATAPRES) 0.1 MG tablet Take 1 tablet (0.1 mg total) by mouth 2 (two) times daily. 60 tablet 2 04/13/2015 at Unknown time  . clopidogrel (PLAVIX) 75 MG tablet Take 1 tablet (75 mg total) by mouth daily. 30 tablet 0  04/13/2015 at Unknown time  . doxazosin (CARDURA) 8 MG tablet Take 8 mg by mouth at bedtime.    04/13/2015 at Unknown time  . hydrALAZINE (APRESOLINE) 100 MG tablet Take 100 mg by mouth 3 (three) times daily.   04/13/2015 at Unknown time  . insulin detemir (LEVEMIR) 100 UNIT/ML injection Inject 0.2 mLs (20 Units total) into the skin daily. 10 mL 3 04/13/2015 at Unknown time  . insulin regular (NOVOLIN R) 100 units/mL injection Inject 0.02 mLs (2 Units total) into the skin 3 (three) times daily before meals. 10 mL 11 04/13/2015 at Unknown time  . metoprolol (LOPRESSOR) 50 MG tablet Take 50 mg by mouth 2 (two) times daily.     04/13/2015 at Unknown time  . QUEtiapine (SEROQUEL) 25 MG tablet Take 1 tablet (25 mg total) by mouth 2 (two) times daily. 30 tablet 0  04/13/2015 at Unknown time  . simvastatin (ZOCOR) 40 MG tablet Take 40 mg by mouth at bedtime.   04/13/2015 at Unknown time  . sodium bicarbonate 650 MG tablet Take 650 mg by mouth 2 (two) times daily.   04/13/2015 at Unknown time   Scheduled: . amLODipine  5 mg Oral Daily  . aspirin  81 mg Oral Daily  . calcium acetate  1,334 mg Oral TID WC  . carbidopa-levodopa  2 tablet Oral BID  . cloNIDine  0.1 mg Oral BID  . doxazosin  8 mg Oral QHS  . heparin  5,000 Units Subcutaneous 3 times per day  . hydrALAZINE  100 mg Oral TID  . insulin aspart  0-9 Units Subcutaneous TID WC  . insulin aspart  2 Units Subcutaneous TID AC  . insulin detemir  20 Units Subcutaneous Daily  . metoprolol  50 mg Oral BID  . QUEtiapine  25 mg Oral BID  . simvastatin  40 mg Oral QHS  . sodium bicarbonate  650 mg Oral BID    ROS: History obtained from the patient  General ROS: negative for - chills, fatigue, fever, night sweats, weight gain or weight loss Psychological ROS: negative for - behavioral disorder, hallucinations, memory difficulties, mood swings or suicidal ideation Ophthalmic ROS: negative for - blurry vision, double vision, eye pain or loss of vision ENT ROS: negative for - epistaxis, nasal discharge, oral lesions, sore throat, tinnitus or vertigo Allergy and Immunology ROS: negative for - hives or itchy/watery eyes Hematological and Lymphatic ROS: negative for - bleeding problems, bruising or swollen lymph nodes Endocrine ROS: negative for - galactorrhea, hair pattern changes, polydipsia/polyuria or temperature intolerance Respiratory ROS: negative for - cough, hemoptysis, shortness of breath or wheezing Cardiovascular ROS: negative for - chest pain, dyspnea on exertion, edema or irregular heartbeat Gastrointestinal ROS: negative for - abdominal pain, diarrhea, hematemesis, nausea/vomiting or stool incontinence Genito-Urinary ROS: negative for - dysuria, hematuria, incontinence or urinary  frequency/urgency Musculoskeletal ROS: negative for - joint swelling or muscular weakness Neurological ROS: as noted in HPI Dermatological ROS: negative for rash and skin lesion changes  Physical Examination: Blood pressure 136/40, pulse 56, temperature 97.8 F (36.6 C), temperature source Oral, resp. rate 18, height 5\' 5"  (1.651 m), weight 58.4 kg (128 lb 12 oz), SpO2 95 %.  HEENT-  Normocephalic, no lesions, without obvious abnormality.  Normal external eye and conjunctiva.  Normal TM's bilaterally.  Normal auditory canals and external ears. Normal external nose, mucus membranes and septum.  Normal pharynx. Cardiovascular- S1, S2 normal, pulses palpable throughout   Lungs- chest clear, no wheezing, rales, normal symmetric  air entry Abdomen- soft, non-tender; bowel sounds normal; no masses,  no organomegaly Extremities- no edema Lymph-no adenopathy palpable Musculoskeletal-no joint tenderness, deformity or swelling Skin-warm and dry, no hyperpigmentation, vitiligo, or suspicious lesions  Neurological Examination Mental Status: Alert, oriented, thought content appropriate.  Speech fluent without evidence of aphasia but dysarthric.  Needs hand over hand assistance for 3 step commands.  Able to follow simple commands without difficulty. Cranial Nerves: II: Discs flat bilaterally; Visual fields grossly normal, pupils equal, round, reactive to light and accommodation III,IV, VI: ptosis not present, extra-ocular motions intact bilaterally V,VII: right facial droop, facial light touch sensation normal bilaterally VIII: hearing normal bilaterally IX,X: gag reflex present XI: bilateral shoulder shrug XII: tongue deviation to the right Motor: Right : Upper extremity   5/5    Left:     Upper extremity   5/5  Lower extremity   5/5     Lower extremity   5/5 Tone and bulk:normal tone throughout; no atrophy noted Sensory: Pinprick and light touch intact throughout, bilaterally Deep Tendon  Reflexes: 2+ in the upper extremities, trace at the knees and absent at the ankles.   Plantars: Right: upgoing   Left: upgoing Cerebellar: Normal finger-to-nose and normal heel-to-shin testing bilaterally   Laboratory Studies:  Basic Metabolic Panel:  Recent Labs Lab 04/13/15 1833 04/14/15 0346 04/15/15 1614  NA 135  --   --   K 3.5  --   --   CL 102  --   --   CO2 26  --   --   GLUCOSE 188*  --   --   BUN 10  --   --   CREATININE 2.04* 2.97*  --   CALCIUM 9.2  --   --   PHOS  --   --  3.8    Liver Function Tests: No results for input(s): AST, ALT, ALKPHOS, BILITOT, PROT, ALBUMIN in the last 168 hours. No results for input(s): LIPASE, AMYLASE in the last 168 hours. No results for input(s): AMMONIA in the last 168 hours.  CBC:  Recent Labs Lab 04/13/15 1833 04/14/15 0346  WBC 11.5* 9.3  NEUTROABS 9.9*  --   HGB 10.1* 9.8*  HCT 30.6* 29.0*  MCV 91.4 89.7  PLT 234 227    Cardiac Enzymes:  Recent Labs Lab 04/13/15 1833  TROPONINI <0.03    BNP: Invalid input(s): POCBNP  CBG:  Recent Labs Lab 04/15/15 0734 04/15/15 1122 04/15/15 1626 04/15/15 2103 04/16/15 0742  GLUCAP 130* 136* 174* 160* 156*    Microbiology: Results for orders placed or performed during the hospital encounter of 03/03/15  Surgical pcr screen     Status: Abnormal   Collection Time: 03/03/15  2:39 PM  Result Value Ref Range Status   MRSA, PCR NEGATIVE NEGATIVE Final   Staphylococcus aureus POSITIVE (A) NEGATIVE Final    Coagulation Studies: No results for input(s): LABPROT, INR in the last 72 hours.  Urinalysis:  Recent Labs Lab 04/13/15 2145  COLORURINE YELLOW*  LABSPEC 1.012  PHURINE 6.0  GLUCOSEU >500*  HGBUR NEGATIVE  BILIRUBINUR NEGATIVE  KETONESUR TRACE*  PROTEINUR >500*  NITRITE NEGATIVE  LEUKOCYTESUR TRACE*    Lipid Panel:    Component Value Date/Time   CHOL 116 03/31/2015 0609   TRIG 112 03/31/2015 0609   HDL 34* 03/31/2015 0609   CHOLHDL 3.4  03/31/2015 0609   VLDL 22 03/31/2015 0609   LDLCALC 60 03/31/2015 0609    HgbA1C:  Lab Results  Component Value Date  HGBA1C 11.2* 03/29/2015    Urine Drug Screen:  No results found for: LABOPIA, COCAINSCRNUR, LABBENZ, AMPHETMU, THCU, LABBARB  Alcohol Level: No results for input(s): ETH in the last 168 hours.  Other results: EKG: atrial fibrillation at 74 bpm.  Imaging: Mr Brain Wo Contrast  04/15/2015  CLINICAL DATA:  Altered mental status and lethargy. Diabetes. Dialysis patient. Recent stroke. EXAM: MRI HEAD WITHOUT CONTRAST TECHNIQUE: Multiplanar, multiecho pulse sequences of the brain and surrounding structures were obtained without intravenous contrast. COMPARISON:  CT head 04/13/2015.  MRI  03/30/2015 FINDINGS: Resolving areas of acute infarct in the putamen and internal capsule bilaterally. These areas show low-level restricted diffusion with interval improvement. Small area of restricted diffusion left thalamus is new. This shows low level restriction and may represent acute or subacute infarction. No other areas of acute infarct. Susceptibility is present in the putamen bilaterally. This shows T1 shortening compatible with methemoglobin. Findings compatible with hemorrhagic infarction which is relatively symmetric. Given location and symmetry, hypoglycemia and hypoperfusion are likely candidates. Methemoglobin has progressed since the prior study. Prior study was significantly degraded by motion. Generalized atrophy. Moderate chronic microvascular ischemic change in the white matter bilaterally. Negative for mass lesion. No shift of the midline structure. Right vertebral artery occluded distally as noted on prior MRA. Mucosal edema in the paranasal sinuses.  No air-fluid level. IMPRESSION: Subacute hemorrhagic infarct in the putamen bilaterally. Possible etiologies include hypoperfusion and hypoglycemia. Low-level restricted diffusion in the left thalamus has developed since MRI of  03/30/2015 compatible with subacute infarct. Atrophy and chronic ischemia throughout the white matter. Mucosal edema throughout the paranasal sinuses. Electronically Signed   By: Franchot Gallo M.D.   On: 04/15/2015 11:39    Assessment: 80 y.o. male presenting after being brought by family for lethargy.  Initial head CT personally reviewed and shows no acute changes.  MRI of the brain reviewed as well and shows evidence of hemorrhagic transformation of his recent putaminal infarcts and a subacute but new left thalamic infarct.  Patient on ASA and Plavix.  Recent work up was unremarkable but initial reading on EKG suggests atrial fibrillation.    Stroke Risk Factors - atrial fibrillation, diabetes mellitus, hyperlipidemia, hypertension and sleep apnea and recent infarct  Plan: 1. HgbA1c, fasting lipid panel 2. MRA  of the brain without contrast to be repeated.  There is concern that all infarcts have been in the vascular distribution of the posterior circulation.   3. PT consult, OT consult, Speech consult 4. D/C Plavix.  Would restart in 7-10 days 5. Continue ASA 6. Would have EKG reviewed.  If truly atrial fibrillation would continue ASA for now with anticoagulation to be initiated in 7-10 days.   7. NPO until RN stroke swallow screen 8. Telemetry monitoring 9. Frequent neuro checks 10. D/C Sinemet     Alexis Goodell, MD Neurology [phone] 04/15/2015,11:09 PM

## 2015-04-17 DIAGNOSIS — R4182 Altered mental status, unspecified: Secondary | ICD-10-CM

## 2015-04-17 LAB — GLUCOSE, CAPILLARY
GLUCOSE-CAPILLARY: 126 mg/dL — AB (ref 65–99)
GLUCOSE-CAPILLARY: 120 mg/dL — AB (ref 65–99)
Glucose-Capillary: 182 mg/dL — ABNORMAL HIGH (ref 65–99)
Glucose-Capillary: 137 mg/dL — ABNORMAL HIGH (ref 65–99)

## 2015-04-17 LAB — CALCITRIOL (1,25 DI-OH VIT D): Vit D, 1,25-Dihydroxy: 30.9 pg/mL (ref 19.9–79.3)

## 2015-04-17 MED ORDER — QUETIAPINE FUMARATE 25 MG PO TABS
12.5000 mg | ORAL_TABLET | Freq: Two times a day (BID) | ORAL | Status: DC
  Administered 2015-04-17 – 2015-04-18 (×2): 12.5 mg via ORAL
  Filled 2015-04-17 (×2): qty 1

## 2015-04-17 NOTE — Clinical Social Work Note (Signed)
CSW received consult for SNF recommendation by PT. Patient was able to ambulate 150 feet. If left alone in bed, patient becomes sleepy and stated to CSW this afternoon he would like for me to call his daughter regarding discharge planning. CSW has left a message for patient's daughter. Shela Leff MSW, [place]

## 2015-04-17 NOTE — Plan of Care (Signed)
Problem: Education: Goal: Knowledge of disease or condition will improve Outcome: Not Progressing Attempted education but pt is lethargic and drowsy; will need reinforcement

## 2015-04-17 NOTE — Progress Notes (Signed)
[facility]  ROUNDING NOTE   Subjective:  Patient resting in bed this AM. He had hemodialysis last on Saturday.   Objective:  Vital signs in last 24 hours:  Temp:  [97.9 F (36.6 C)-98.7 F (37.1 C)] 97.9 F (36.6 C) (03/20 0530) Pulse Rate:  [58-64] 58 (03/20 0530) Resp:  [18-20] 20 (03/20 0530) BP: (101-168)/(37-98) 168/57 mmHg (03/20 0530) SpO2:  [93 %-99 %] 96 % (03/20 0530)  Weight change:  Filed Weights   04/15/15 0508 04/15/15 1636 04/15/15 1949  Weight: 60.51 kg (133 lb 6.4 oz) 60.328 kg (133 lb) 58.4 kg (128 lb 12 oz)    Intake/Output: I/O last 3 completed shifts: In: 290 [P.O.:290] Out: 1800 [Urine:300; Other:1500]   Intake/Output this shift:  Total I/O In: 240 [P.O.:240] Out: -   Physical Exam: General: NAD, resting in bed  Head:  Moist oral mucosal membranes  Eyes: Anicteric  Neck: Supple, trachea midline  Lungs:  Clear to auscultation, normal effort  Heart: Regular rate and rhythm no rubs  Abdomen:  Soft, nontender, BS present  Extremities: No peripheral edema.  Neurologic: Awake, alert, following simple commands  Skin: No lesions  Access: R IJ permcath    Basic Metabolic Panel:  Recent Labs Lab 04/13/15 1833 04/14/15 0346 04/15/15 1614  NA 135  --   --   K 3.5  --   --   CL 102  --   --   CO2 26  --   --   GLUCOSE 188*  --   --   BUN 10  --   --   CREATININE 2.04* 2.97*  --   CALCIUM 9.2  --   --   PHOS  --   --  3.8    Liver Function Tests: No results for input(s): AST, ALT, ALKPHOS, BILITOT, PROT, ALBUMIN in the last 168 hours. No results for input(s): LIPASE, AMYLASE in the last 168 hours. No results for input(s): AMMONIA in the last 168 hours.  CBC:  Recent Labs Lab 04/13/15 1833 04/14/15 0346  WBC 11.5* 9.3  NEUTROABS 9.9*  --   HGB 10.1* 9.8*  HCT 30.6* 29.0*  MCV 91.4 89.7  PLT 234 227    Cardiac Enzymes:  Recent Labs Lab 04/13/15 1833  TROPONINI <0.03    BNP: Invalid input(s):  POCBNP  CBG:  Recent Labs Lab 04/16/15 0742 04/16/15 1141 04/16/15 1645 04/16/15 2117 04/17/15 0807  GLUCAP 156* 172* 129* 106* 126*    Microbiology: Results for orders placed or performed during the hospital encounter of 03/03/15  Surgical pcr screen     Status: Abnormal   Collection Time: 03/03/15  2:39 PM  Result Value Ref Range Status   MRSA, PCR NEGATIVE NEGATIVE Final   Staphylococcus aureus POSITIVE (A) NEGATIVE Final    Coagulation Studies: No results for input(s): LABPROT, INR in the last 72 hours.  Urinalysis: No results for input(s): COLORURINE, LABSPEC, PHURINE, GLUCOSEU, HGBUR, BILIRUBINUR, KETONESUR, PROTEINUR, UROBILINOGEN, NITRITE, LEUKOCYTESUR in the last 72 hours.  Invalid input(s): APPERANCEUR    Imaging: Mr Virgel Paling [id number] Contrast  04/16/2015  CLINICAL DATA:  Stroke. EXAM: MRA HEAD WITHOUT CONTRAST TECHNIQUE: Angiographic images of the Circle of Willis were obtained using MRA technique without intravenous contrast. COMPARISON:  MRA 04/03/2015.  MRI 04/15/2015 FINDINGS: Distal right vertebral artery not visualized and may be hypoplastic or end in PICA. This is unchanged from the prior study. Left vertebral artery patent to the basilar. PICA patent on the left but not  visualized on the right. Basilar widely patent. Superior cerebellar and posterior cerebral arteries patent without significant stenosis. Patent right posterior communicating artery. Severe tandem stenosis right cavernous carotid unchanged. Post stenotic dilatation supraclinoid internal carotid artery on the right unchanged. Hypoplastic right A1 segment unchanged. Moderate stenosis right M1 segment with scattered atherosclerotic disease right middle cerebral artery branches Moderate stenosis left cavernous carotid. Mild poststenotic dilatation left supraclinoid internal carotid artery. Severe stenosis left MCA bifurcation unchanged from the prior study. Multiple areas of branch stenosis left middle  cerebral artery. Both anterior cerebral arteries supplied from the left and appear patent. Negative for cerebral aneurysm. IMPRESSION: Distal right vertebral and not visualized probably hypoplastic Severe stenosis right cavernous carotid and moderate stenosis left cavernous carotid. Bilateral MCA disease. No change from the prior study. Electronically Signed   By: Franchot Gallo M.D.   On: 04/16/2015 15:36     Medications:     . amLODipine  5 mg Oral Daily  . aspirin  81 mg Oral Daily  . calcium acetate  1,334 mg Oral TID WC  . cloNIDine  0.1 mg Oral BID  . doxazosin  8 mg Oral QHS  . heparin  5,000 Units Subcutaneous 3 times per day  . hydrALAZINE  100 mg Oral TID  . insulin aspart  0-9 Units Subcutaneous TID WC  . insulin aspart  2 Units Subcutaneous TID AC  . insulin detemir  20 Units Subcutaneous Daily  . metoprolol  50 mg Oral BID  . QUEtiapine  12.5 mg Oral BID  . simvastatin  40 mg Oral QHS  . sodium bicarbonate  650 mg Oral BID   acetaminophen **OR** acetaminophen, ondansetron **OR** ondansetron (ZOFRAN) IV  Assessment/ Plan:  80 y.o. 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 dialysis center by [facility] nephrology, hypertension, diabetes mellitus type 2, COPD, CVA with hemorrhagic infarct in the putamen [facility]phrology [facility]  1.  ESRD on HD TTHS:  Last dialysis was on Saturday. No acute indication for dialysis today. We will plan for dialysis again tomorrow if still here. 2.  Anemia of CKD: Hold epogen given recent CVA, hgb 9.8 at the moment.   3.  Secondary Hyperparathyroidism:  Continue to monitor phosphorus and the course of hospitalization.  Continue PhosLo 2 tablets by mouth 3 times a day with meals.  4. Diabetes Mellitus type II with chronic kidney  disease: Most recent A1c was quite high at 11.2. Management per hospitalist.  5. Hypertension: Blood pressure higher this AM at 168/57 however there are periods of good control. -  Continue Amlodipine, clonidine, doxazosin, hydralazine, metoprolol   LOS:  Sondra Blixt 3/20/201711:26 AM

## 2015-04-17 NOTE — Progress Notes (Signed)
PT Cancellation Note  Patient Details Name: Jason Aguilar MRN: [id number] DOB: 1935/06/08   Cancelled Treatment:    Reason Eval/Treat Not Completed: Fatigue/lethargy limiting ability to participate (Patient sleeping upon arrival.  Opens eyes briefly to cuing from therapist, but unable to maintain alertness for adequate participation with session.  Will re-attempt at later time/date as medically appropriate.)   Sandara Tyree H. Owens Shark, PT, DPT, NCS 04/17/2015, 3:12 PM [phone]

## 2015-04-17 NOTE — Progress Notes (Signed)
Patient ID: Jason Aguilar, male   DOB: 1935-02-12, 80 y.o.   MRN: [name] DOB: Apr 23, 1935 DOA: 04/13/2015 PCP: Jason Castle, MD  HPI/Subjective: Remains lethargic intermittently, wakes up but falls back to sleep very easily while talking.  Objective: Filed Vitals:   04/17/15 0530 04/17/15 0530  BP: 168/57   Pulse: 58 58  Temp: 97.9 F (36.6 C)   Resp: 20     Filed Weights   04/15/15 0508 04/15/15 1636 04/15/15 1949  Weight: 60.51 kg (133 lb 6.4 oz) 60.328 kg (133 lb) 58.4 kg (128 lb 12 oz)    ROS: Review of Systems  Constitutional: Positive for malaise/fatigue. Negative for fever and chills.  Eyes: Negative for blurred vision.  Respiratory: Negative for cough and shortness of breath.   Cardiovascular: Negative for chest pain.  Gastrointestinal: Negative for nausea, vomiting, abdominal pain, diarrhea and constipation.  Genitourinary: Negative for dysuria.  Musculoskeletal: Negative for joint pain.  Neurological: Positive for weakness. Negative for dizziness and headaches.   Exam: Physical Exam  Constitutional: He is oriented to person, place, and time.  HENT:  Nose: No mucosal edema.  Mouth/Throat: No oropharyngeal exudate or posterior oropharyngeal edema.  Eyes: Conjunctivae, EOM and lids are normal. Pupils are equal, round, and reactive to light.  Neck: No JVD present. Carotid bruit is not present. No edema present. No thyroid mass and no thyromegaly present.  Cardiovascular: S1 normal and S2 normal.  Exam reveals no gallop.   No murmur heard. Pulses:      Dorsalis pedis pulses are 2+ on the right side, and 2+ on the left side.  Respiratory: No respiratory distress. He has no wheezes. He has no rhonchi. He has no rales.  GI: Soft. Bowel sounds are normal. There is no tenderness.  Musculoskeletal:       Right ankle: He exhibits no swelling.       Left ankle: He exhibits no swelling.   Lymphadenopathy:    He has no cervical adenopathy.  Neurological: He is alert and oriented to person, place, and time.  Patient with slurred speech and left facial droop. 4 out of 5 weakness on the right upper extremity. Other extremities 5 out of 5 power. Sensation intact to light touch on face bilaterally  Skin: Skin is warm. No rash noted. Nails show no clubbing.  Psychiatric: He has a normal mood and affect.      Data Reviewed: Basic Metabolic Panel:  Recent Labs Lab 04/13/15 1833 04/14/15 0346 04/15/15 1614  NA 135  --   --   K 3.5  --   --   CL 102  --   --   CO2 26  --   --   GLUCOSE 188*  --   --   BUN 10  --   --   CREATININE 2.04* 2.97*  --   CALCIUM 9.2  --   --   PHOS  --   --  3.8   CBC:  Recent Labs Lab 04/13/15 1833 04/14/15 0346  WBC 11.5* 9.3  NEUTROABS 9.9*  --   HGB 10.1* 9.8*  HCT 30.6* 29.0*  MCV 91.4 89.7  PLT 234 227   Cardiac Enzymes:  Recent Labs Lab 04/13/15 1833  TROPONINI <0.03   BNP (last 3 results)  Recent Labs  01/02/15 1924  BNP 1454.0*   CBG:  Recent Labs Lab 04/16/15 1141 04/16/15 1645 04/16/15 2117 04/17/15 0807 04/17/15 1131  GLUCAP 172* 129* 106* 126* 182*    Studies: Mr Jason Aguilar Wo Contrast  04/16/2015  CLINICAL DATA:  Stroke. EXAM: MRA HEAD WITHOUT CONTRAST TECHNIQUE: Angiographic images of the Circle of Willis were obtained using MRA technique without intravenous contrast. COMPARISON:  MRA 04/03/2015.  MRI 04/15/2015 FINDINGS: Distal right vertebral artery not visualized and may be hypoplastic or end in PICA. This is unchanged from the prior study. Left vertebral artery patent to the basilar. PICA patent on the left but not visualized on the right. Basilar widely patent. Superior cerebellar and posterior cerebral arteries patent without significant stenosis. Patent right posterior communicating artery. Severe tandem stenosis right cavernous carotid unchanged. Post stenotic dilatation supraclinoid internal  carotid artery on the right unchanged. Hypoplastic right A1 segment unchanged. Moderate stenosis right M1 segment with scattered atherosclerotic disease right middle cerebral artery branches Moderate stenosis left cavernous carotid. Mild poststenotic dilatation left supraclinoid internal carotid artery. Severe stenosis left MCA bifurcation unchanged from the prior study. Multiple areas of branch stenosis left middle cerebral artery. Both anterior cerebral arteries supplied from the left and appear patent. Negative for cerebral aneurysm. IMPRESSION: Distal right vertebral and not visualized probably hypoplastic Severe stenosis right cavernous carotid and moderate stenosis left cavernous carotid. Bilateral MCA disease. No change from the prior study. Electronically Signed   By: Jason Aguilar M.D.   On: 04/16/2015 15:36    Scheduled Meds: . amLODipine  5 mg Oral Daily  . aspirin  81 mg Oral Daily  . calcium acetate  1,334 mg Oral TID WC  . cloNIDine  0.1 mg Oral BID  . doxazosin  8 mg Oral QHS  . heparin  5,000 Units Subcutaneous 3 times per day  . hydrALAZINE  100 mg Oral TID  . insulin aspart  0-9 Units Subcutaneous TID WC  . insulin aspart  2 Units Subcutaneous TID AC  . insulin detemir  20 Units Subcutaneous Daily  . metoprolol  50 mg Oral BID  . QUEtiapine  12.5 mg Oral BID  . simvastatin  40 mg Oral QHS  . sodium bicarbonate  650 mg Oral BID    Assessment/Plan:  1. Acute hemorrhagic strokes, subacute stroke.  She recommended an MRA of the brain to evaluate posterior circulation. Stop Plavix for right now. Continue aspirin and simvastatin. OT recommends STR/SNF, await PT eval, speech therapy consultations also. Continue on telemetry. Patient had previous echocardiogram and carotid ultrasound. Based on Neuro recommendations - cutting back on seroquel due to lethargy, may need to be stopped completely 2. End-stage renal disease continue dialysis as per nephrology. Plan for HD tomorrow per  their records. 3. Essential hypertension-patient on numerous blood pressure medications. Rather her blood pressure a little high than too low. May be due to lack of HD it's running high, it may drop post HD 4. Type 2 diabetes continue Levemir insulin and sliding scale 5. Parkinson's disease on Sinemet 6. Weakness- physical therapy evaluation  Code Status: Full Code Family Communication: Spoke with the Daughter on phone Disposition Plan: STR/SNF  Consultants:  Neurology  Time spent: 23 minutes  [facility], [place] Hospitalists

## 2015-04-17 NOTE — Progress Notes (Addendum)
Subjective: Patient lethargic this morning and on review of the previous notes is intermittently lethargic.    Objective: Current vital signs: BP 168/57 mmHg  Pulse 58  Temp(Src) 97.9 F (36.6 C) (Oral)  Resp 20  Ht 5\' 5"  (1.651 m)  Wt 58.4 kg (128 lb 12 oz)  BMI 21.42 kg/m2  SpO2 96% Vital signs in last 24 hours: Temp:  [97.9 F (36.6 C)-98.7 F (37.1 C)] 97.9 F (36.6 C) (03/20 0530) Pulse Rate:  [58-64] 58 (03/20 0530) Resp:  [18-20] 20 (03/20 0530) BP: (101-168)/(37-98) 168/57 mmHg (03/20 0530) SpO2:  [93 %-99 %] 96 % (03/20 0530)  Intake/Output from previous day: 03/19 0701 - 03/20 0700 In: 290 [P.O.:290] Out: 100 [Urine:100] Intake/Output this shift: Total I/O In: 240 [P.O.:240] Out: -  Nutritional status: Diet renal/carb modified with fluid restriction Diet-HS Snack?: Nothing; Room service appropriate?: Yes; Fluid consistency:: Thin  Neurologic Exam: Mental Status: Lethargic but arousable. Speech fluent without evidence of aphasia but dysarthric. Cranial Nerves: II: Discs flat bilaterally; Visual fields grossly normal, pupils equal, round, reactive to light and accommodation III,IV, VI: ptosis not present, extra-ocular motions intact bilaterally V,VII: right facial droop, facial light touch sensation normal bilaterally VIII: hearing normal bilaterally IX,X: gag reflex present XI: bilateral shoulder shrug XII: tongue deviation to the right Motor: Right :Upper extremity 5/5Left: Upper extremity 5/5 Lower extremity 5/5Lower extremity 5/5   Lab Results: Basic Metabolic Panel:  Recent Labs Lab 04/13/15 1833 04/14/15 0346 04/15/15 1614  NA 135  --   --   K 3.5  --   --   CL 102  --   --   CO2 26  --   --   GLUCOSE 188*  --   --   BUN 10  --   --   CREATININE 2.04* 2.97*  --   CALCIUM 9.2  --   --   PHOS  --   --  3.8    Liver  Function Tests: No results for input(s): AST, ALT, ALKPHOS, BILITOT, PROT, ALBUMIN in the last 168 hours. No results for input(s): LIPASE, AMYLASE in the last 168 hours. No results for input(s): AMMONIA in the last 168 hours.  CBC:  Recent Labs Lab 04/13/15 1833 04/14/15 0346  WBC 11.5* 9.3  NEUTROABS 9.9*  --   HGB 10.1* 9.8*  HCT 30.6* 29.0*  MCV 91.4 89.7  PLT 234 227    Cardiac Enzymes:  Recent Labs Lab 04/13/15 1833  TROPONINI <0.03    Lipid Panel: No results for input(s): CHOL, TRIG, HDL, CHOLHDL, VLDL, LDLCALC in the last 168 hours.  CBG:  Recent Labs Lab 04/16/15 0742 04/16/15 1141 04/16/15 1645 04/16/15 2117 04/17/15 0807  GLUCAP 156* 172* 129* 106* 126*    Microbiology: Results for orders placed or performed during the hospital encounter of 03/03/15  Surgical pcr screen     Status: Abnormal   Collection Time: 03/03/15  2:39 PM  Result Value Ref Range Status   MRSA, PCR NEGATIVE NEGATIVE Final   Staphylococcus aureus POSITIVE (A) NEGATIVE Final    Coagulation Studies: No results for input(s): LABPROT, INR in the last 72 hours.  Imaging: Mr Virgel Paling [id number] Contrast  04/16/2015  CLINICAL DATA:  Stroke. EXAM: MRA HEAD WITHOUT CONTRAST TECHNIQUE: Angiographic images of the Circle of Willis were obtained using MRA technique without intravenous contrast. COMPARISON:  MRA 04/03/2015.  MRI 04/15/2015 FINDINGS: Distal right vertebral artery not visualized and may be hypoplastic or end in PICA. This is unchanged  from the prior study. Left vertebral artery patent to the basilar. PICA patent on the left but not visualized on the right. Basilar widely patent. Superior cerebellar and posterior cerebral arteries patent without significant stenosis. Patent right posterior communicating artery. Severe tandem stenosis right cavernous carotid unchanged. Post stenotic dilatation supraclinoid internal carotid artery on the right unchanged. Hypoplastic right A1 segment  unchanged. Moderate stenosis right M1 segment with scattered atherosclerotic disease right middle cerebral artery branches Moderate stenosis left cavernous carotid. Mild poststenotic dilatation left supraclinoid internal carotid artery. Severe stenosis left MCA bifurcation unchanged from the prior study. Multiple areas of branch stenosis left middle cerebral artery. Both anterior cerebral arteries supplied from the left and appear patent. Negative for cerebral aneurysm. IMPRESSION: Distal right vertebral and not visualized probably hypoplastic Severe stenosis right cavernous carotid and moderate stenosis left cavernous carotid. Bilateral MCA disease. No change from the prior study. Electronically Signed   By: Franchot Gallo M.D.   On: 04/16/2015 15:36    Medications:  I have reviewed the patient's current medications. Scheduled: . amLODipine  5 mg Oral Daily  . aspirin  81 mg Oral Daily  . calcium acetate  1,334 mg Oral TID WC  . cloNIDine  0.1 mg Oral BID  . doxazosin  8 mg Oral QHS  . heparin  5,000 Units Subcutaneous 3 times per day  . hydrALAZINE  100 mg Oral TID  . insulin aspart  0-9 Units Subcutaneous TID WC  . insulin aspart  2 Units Subcutaneous TID AC  . insulin detemir  20 Units Subcutaneous Daily  . metoprolol  50 mg Oral BID  . QUEtiapine  12.5 mg Oral BID  . simvastatin  40 mg Oral QHS  . sodium bicarbonate  650 mg Oral BID    Assessment/Plan: Patient remains intermittently lethargic.  Sinemet discontinued.  Seroquel started on last visit for chorea.  Possible cause of lethargy as well.  Currently chorea is not an issue. MRA shows no change since last admission.  Recommendations: 1.  Decrease Seroquel to 12.5mg  BID.  If tolerated will discontinue.      LOS: 2 days   Alexis Goodell, MD Neurology [phone] 04/17/2015  11:20 AM

## 2015-04-17 NOTE — Progress Notes (Signed)
Initial Nutrition Assessment     INTERVENTION:  Meals and snacks: Cater to pt preferences Medical Nutrition Supplement Therapy: If unable to meet nutritional needs will need supplement   NUTRITION DIAGNOSIS:   Inadequate oral intake related to lethargy/confusion as evidenced by per patient/family report.    GOAL:   Patient will meet greater than or equal to 90% of their needs    MONITOR:   PO intake  REASON FOR ASSESSMENT:    (dialysis pt)    ASSESSMENT:      Pt admitted with stroke  Past Medical History  Diagnosis Date  . Glaucoma     both eyes Drs. Arrie [place] and [place]  . Hematuria syndrome     s/p negative cystoscopy  . GERD (gastroesophageal reflux disease) 2003    EGD  . Allergy   . Hyperlipidemia   . Obstructive sleep apnea of adult     CPAP at 8 cm H20  . Noncompliance with medication regimen   . 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 by [facility]: low grade noninvasive  . Chronic kidney disease (CKD), stage III (moderate)     with proteinuria  . Glaucoma   . Hypertension   . Diabetes mellitus     Type 2  . Cataract due to secondary diabetes mellitus ([place])   . COPD (chronic obstructive pulmonary disease) ([place])     secondary to tobacco abuse  . History of tobacco abuse     60 pack year history; quit 1980    Current Nutrition: pt lethargic this pm during visit.  Wife reports decreased intake today but yesterday ate well because wife fed him.    Food/Nutrition-Related History: Wife reports that pt has been eating well prior to admission   Scheduled Medications:  . amLODipine  5 mg Oral Daily  . aspirin  81 mg Oral Daily  . calcium acetate  1,334 mg Oral TID WC  . cloNIDine  0.1 mg Oral BID  . doxazosin  8 mg Oral QHS  . heparin  5,000 Units Subcutaneous 3 times per day  . hydrALAZINE  100 mg Oral TID  . insulin aspart  0-9 Units Subcutaneous TID WC  . insulin aspart  2 Units Subcutaneous TID AC  . insulin detemir  20 Units  Subcutaneous Daily  . metoprolol  50 mg Oral BID  . QUEtiapine  12.5 mg Oral BID  . simvastatin  40 mg Oral QHS  . sodium bicarbonate  650 mg Oral BID       Electrolyte/Renal Profile and Glucose Profile:   Recent Labs Lab 04/13/15 1833 04/14/15 0346 04/15/15 1614  NA 135  --   --   K 3.5  --   --   CL 102  --   --   CO2 26  --   --   BUN 10  --   --   CREATININE 2.04* 2.97*  --   CALCIUM 9.2  --   --   PHOS  --   --  3.8  GLUCOSE 188*  --   --     Gastrointestinal Profile: Last BM: 3/17   Nutrition-Focused Physical Exam Findings: deferred at this time   Weight Change: noted 10% wt loss in the last month per wt encounters    Diet Order:  Diet renal/carb modified with fluid restriction Diet-HS Snack?: Nothing; Room service appropriate?: Yes; Fluid consistency:: Thin  Skin:   reviewed     Height:   Ht Readings from  Last 1 Encounters:  04/13/15 5\' 5"  (1.651 m)    Weight:   Wt Readings from Last 1 Encounters:  04/15/15 128 lb 12 oz (58.4 kg)    Ideal Body Weight:     BMI:  Body mass index is 21.42 kg/(m^2).  Estimated Nutritional Needs:   Kcal:  BEE 1231 kcals (IF 1.0-1.2, AF 1.3) 1600-1920 kcals/d.   Protein:  (1.2-1.5 g/kg) 70-87 g/d  Fluid:  (1000 + UOP)  EDUCATION NEEDS:   No education needs identified at this time  [place]. Zenia Resides, [place], [place] (pager) Weekend/On-Call pager [phone])

## 2015-04-17 NOTE — Evaluation (Signed)
Occupational Therapy Evaluation Patient Details Name: Jason Aguilar MRN: [id number] DOB: [birth year] Today's Date: 04/17/2015    History of Present Illness Pt c AMS, lethargy; head CT (-), MRI (3/18) (+) subacute infarct. MRI note, and neuro note identifies an acute lesion as well.  Of note, patient with recent hospitalziation (approx 2-3 weeks prior) with R lacunar infarct, AMS; discharged home with outpatient PT. At baseline, pt reports household distance AMB with QC for >1 year. Pt on HD T/R/Sa. MRA pending..   Clinical Impression   Pt. Is a 80y.o. Male who was admitted to [facility] with AMS, and a subacute infarct. Pt. Presents with limited ADL functioning requiring modA LE dressing, and minA with set-up for grooming tasks.  Pt. could benefit from skilled OT services to improve ADL tasks, improve safety, and assist with returning home to PLOF.    Follow Up Recommendations  SNF    Equipment Recommendations       Recommendations for Other Services       Precautions / Restrictions Precautions Precautions: Fall Restrictions Weight Bearing Restrictions: No      Mobility Bed Mobility Overal bed mobility: Independent with supine to sit to EOB, minA scooting to [facility]                Transfers                      Balance       Sitting balance - Comments: good static unsupported sitting balance, Fair dynamic unsupported sitting balance.                                    ADL Overall ADL's : Needs assistance/impaired                                       General ADL Comments: Pt. requires minA self-grooming tasks, modA LE dressing, minA gown management.      Vision Additional Comments: Pt. with limited tracking to left with the left eye.   Perception     Praxis      Pertinent Vitals/Pain Pain Assessment: 0-10 Pain Score: 3  Pain Location: Right sided rib pain     Hand Dominance Right   Extremity/Trunk Assessment Upper  Extremity Assessment Upper Extremity Assessment: Overall WFL for tasks assessed           Communication Communication Communication: No difficulties   Cognition Arousal/Alertness: Lethargic   Overall Cognitive Status: Within Functional Limits for tasks assessed Area of Impairment: Safety/judgement Orientation Level: Person;Place;Time;Situation                 General Comments       Exercises       Shoulder Instructions      Home Living Family/patient expects to be discharged to:: Private residence Living Arrangements: Spouse/significant other;Children Available Help at Discharge: Family;Available 24 hours/day Type of Home: Mobile home Home Access: Ramped entrance     Home Layout: One level     Bathroom Shower/Tub: Teacher, early years/pre: Standard     Home Equipment: Cane - single point;Walker - 2 wheels          Prior Functioning/Environment Level of Independence: Independent with assistive device(s)        Comments: Intermittent use of spc. Pt drives. Has  not been driving since confusion started 2 weeks ago. Limited community ambulator due to limited cardiopulmonary status    OT Diagnosis: Generalized weakness   OT Problem List: Decreased strength;Decreased activity tolerance;Pain   OT Treatment/Interventions: Self-care/ADL training;Therapeutic exercise;Neuromuscular education;Energy conservation;Patient/family education;Therapeutic activities    OT Goals(Current goals can be found in the care plan section)    OT Frequency: Min 1X/week   Barriers to D/C:            Co-evaluation              End of Session    Activity Tolerance: Patient tolerated treatment well Patient left:     Time:  -    Charges:  OT General Charges $OT Visit: 1 Procedure OT Evaluation $OT Eval Moderate Complexity: 1 Procedure G-Codes:    Harrel Carina, MS, OTR/L   Harrel Carina 04/17/2015, 11:40 AM

## 2015-04-18 LAB — GLUCOSE, CAPILLARY
GLUCOSE-CAPILLARY: 114 mg/dL — AB (ref 65–99)
GLUCOSE-CAPILLARY: 58 mg/dL — AB (ref 65–99)
GLUCOSE-CAPILLARY: 67 mg/dL (ref 65–99)
GLUCOSE-CAPILLARY: 90 mg/dL (ref 65–99)
Glucose-Capillary: 86 mg/dL (ref 65–99)
Glucose-Capillary: 189 mg/dL — ABNORMAL HIGH (ref 65–99)

## 2015-04-18 LAB — CBC
HEMATOCRIT: 29.2 % — AB (ref 40.0–52.0)
HEMOGLOBIN: 9.9 g/dL — AB (ref 13.0–18.0)
MCH: 30.2 pg (ref 26.0–34.0)
MCHC: 33.7 g/dL (ref 32.0–36.0)
MCV: 89.5 fL (ref 80.0–100.0)
Platelets: 194 10*3/uL (ref 150–440)
RBC: 3.27 MIL/uL — ABNORMAL LOW (ref 4.40–5.90)
RDW: 14 % (ref 11.5–14.5)
WBC: 9.5 10*3/uL (ref 3.8–10.6)

## 2015-04-18 NOTE — Care Management Important Message (Signed)
Important Message  Patient Details  Name: Jason Aguilar MRN: [id number] Date of Birth: Feb 25, 1935   Medicare Important Message Given:  Yes    [name] 04/18/2015, 11:20 AM

## 2015-04-18 NOTE — Progress Notes (Signed)
Pt fs 60. Following hypoglycemic protocol and pt given half cup of soda will recheck and cont to monitor.

## 2015-04-18 NOTE — Progress Notes (Signed)
Post HD Tx Assessment  

## 2015-04-18 NOTE — Progress Notes (Signed)
PT Cancellation Note  Patient Details Name: Jason Aguilar MRN: [id number] DOB: [birth year]   Cancelled Treatment:    Reason Eval/Treat Not Completed: Patient at procedure or test/unavailable. Treatment attempted this afternoon; pt has just left for hemodialysis. Re attempt treatment tomorrow.    [place], [place] 04/18/2015, 1:03 PM

## 2015-04-18 NOTE — Progress Notes (Signed)
Patient A/O, no noted distress. Noted during initial assessment facial asymmetrical left. Patient able to grip with both hands. Left hand noted some weakness. Tolerated meds well. Writer had 2nd nurse to re-evaluate patient facial both are in agreement. Do no think its something new. Patient voice clear at time, but note slur. Patient needs preparation for meals, he can feed himself; needs sitting up and hard to open packages open. Patient skin has healed scars and some discoloration(BLE) generalized, some noted redness on bottom. Staff will continue to monitor and meet needs.

## 2015-04-18 NOTE — Clinical Social Work Note (Signed)
Clinical Social Work Assessment  Patient Details  Name: Jason Aguilar MRN: [id number] Date of Birth: [birth year]  Date of referral:  04/18/15               Reason for consult:  Facility Placement                Permission sought to share information with:  Family Supports Permission granted to share information::  Yes, Verbal Permission Granted  Name::        Agency::     Relationship::     Contact Information:     Housing/Transportation Living arrangements for the past 2 months:  Single Family Home Source of Information:  Spouse, Adult Children Patient Interpreter Needed:  None Criminal Activity/Legal Involvement Pertinent to Current Situation/Hospitalization:  No - Comment as needed Significant Relationships:  Adult Children, Spouse Lives with:  Spouse Do you feel safe going back to the place where you live?    Need for family participation in patient care:     Care giving concerns:  Patient resides at home with his wife who is disabled.   Social Worker assessment / plan:  CSW reviewed recommendation by PT for STR. Patient ambulated 150 feet. Patient is known to this CSW from previous admission. Patient has a very unsteady gait but is ambulatory. CSW met with patient briefly yesterday afternoon and he was lethargic and requested I speak with his wife and daughter about any discharge planning. CSW met with patient's wife and daughter when they arrived and they expressed concern that he has been so lethargic and stated that this is not his norm. Patient's wife and daughter prefer not to do bedsearch yet due to they feel as though he can return home. CSW will continue to follow.  Employment status:  Retired Forensic scientist:  Medicare PT Recommendations:  [place] / Referral to community resources:     Patient/Family's Response to care:  Patient's wife and daughter expressed appreciation for CSW visit.  Patient/Family's Understanding of and  Emotional Response to Diagnosis, Current Treatment, and Prognosis:  Patient's wife and daughter are concerned regarding patient's lethargy and are aware that the physician is tweaking some of his medication to see if this improves.  Emotional Assessment Appearance:  Appears stated age Attitude/Demeanor/Rapport:   (lethargic ) Affect (typically observed):  Calm Orientation:  Oriented to Self, Oriented to Place, Oriented to Situation Alcohol / Substance use:  Not Applicable Psych involvement (Current and /or in the community):  No (Comment)  Discharge Needs  Concerns to be addressed:  Care Coordination Readmission within the last 30 days:  Yes Current discharge risk:  None Barriers to Discharge:  No Barriers Identified   Shela Leff, LCSW 04/18/2015, 8:39 AM

## 2015-04-18 NOTE — Progress Notes (Signed)
Hemodialysis completed. 

## 2015-04-18 NOTE — Progress Notes (Signed)
Post HD Tx Assesment

## 2015-04-18 NOTE — Progress Notes (Signed)
[facility]  ROUNDING NOTE   Subjective:  Patient seen and evaluated during hemodialysis. Appears to be tolerating well.    Objective:  Vital signs in last 24 hours:  Temp:  [97.5 F (36.4 C)-98.1 F (36.7 C)] 98 F (36.7 C) (03/21 1317) Pulse Rate:  [55-61] 61 (03/21 1400) Resp:  [10-20] 10 (03/21 1330) BP: (135-160)/(42-67) 146/63 mmHg (03/21 1400) SpO2:  [95 %-99 %] 98 % (03/21 1317) Weight:  [58.4 kg (128 lb 12 oz)] 58.4 kg (128 lb 12 oz) (03/21 1310)  Weight change:  Filed Weights   04/15/15 1636 04/15/15 1949 04/18/15 1310  Weight: 60.328 kg (133 lb) 58.4 kg (128 lb 12 oz) 58.4 kg (128 lb 12 oz)    Intake/Output: I/O last 3 completed shifts: In: 770 [P.O.:770] Out: 450 [Urine:450]   Intake/Output this shift:  Total I/O In: 240 [P.O.:240] Out: 200 [Urine:200]  Physical Exam: General: NAD, resting in bed  Head:  Moist oral mucosal membranes  Eyes: Anicteric  Neck: Supple, trachea midline  Lungs:  Clear to auscultation, normal effort  Heart: Regular rate and rhythm no rubs  Abdomen:  Soft, nontender, BS present  Extremities: No peripheral edema.  Neurologic: Awake, alert, following simple commands  Skin: No lesions  Access: R IJ permcath    Basic Metabolic Panel:  Recent Labs Lab 04/13/15 1833 04/14/15 0346 04/15/15 1614  NA 135  --   --   K 3.5  --   --   CL 102  --   --   CO2 26  --   --   GLUCOSE 188*  --   --   BUN 10  --   --   CREATININE 2.04* 2.97*  --   CALCIUM 9.2  --   --   PHOS  --   --  3.8    Liver Function Tests: No results for input(s): AST, ALT, ALKPHOS, BILITOT, PROT, ALBUMIN in the last 168 hours. No results for input(s): LIPASE, AMYLASE in the last 168 hours. No results for input(s): AMMONIA in the last 168 hours.  CBC:  Recent Labs Lab 04/13/15 1833 04/14/15 0346 04/18/15 0616  WBC 11.5* 9.3 9.5  NEUTROABS 9.9*  --   --   HGB 10.1* 9.8* 9.9*  HCT 30.6* 29.0* 29.2*  MCV 91.4 89.7 89.5  PLT 234  227 194    Cardiac Enzymes:  Recent Labs Lab 04/13/15 1833  TROPONINI <0.03    BNP: Invalid input(s): POCBNP  CBG:  Recent Labs Lab 04/17/15 1131 04/17/15 1639 04/17/15 2130 04/18/15 0719 04/18/15 1125  GLUCAP 182* 137* 120* 101 189*    Microbiology: Results for orders placed or performed during the hospital encounter of 03/03/15  Surgical pcr screen     Status: Abnormal   Collection Time: 03/03/15  2:39 PM  Result Value Ref Range Status   MRSA, PCR NEGATIVE NEGATIVE Final   Staphylococcus aureus POSITIVE (A) NEGATIVE Final    Coagulation Studies: No results for input(s): LABPROT, INR in the last 72 hours.  Urinalysis: No results for input(s): COLORURINE, LABSPEC, PHURINE, GLUCOSEU, HGBUR, BILIRUBINUR, KETONESUR, PROTEINUR, UROBILINOGEN, NITRITE, LEUKOCYTESUR in the last 72 hours.  Invalid input(s): APPERANCEUR    Imaging: Mr Virgel Paling [id number] Contrast  04/16/2015  CLINICAL DATA:  Stroke. EXAM: MRA HEAD WITHOUT CONTRAST TECHNIQUE: Angiographic images of the Circle of Willis were obtained using MRA technique without intravenous contrast. COMPARISON:  MRA 04/03/2015.  MRI 04/15/2015 FINDINGS: Distal right vertebral artery not visualized and may be hypoplastic  or end in PICA. This is unchanged from the prior study. Left vertebral artery patent to the basilar. PICA patent on the left but not visualized on the right. Basilar widely patent. Superior cerebellar and posterior cerebral arteries patent without significant stenosis. Patent right posterior communicating artery. Severe tandem stenosis right cavernous carotid unchanged. Post stenotic dilatation supraclinoid internal carotid artery on the right unchanged. Hypoplastic right A1 segment unchanged. Moderate stenosis right M1 segment with scattered atherosclerotic disease right middle cerebral artery branches Moderate stenosis left cavernous carotid. Mild poststenotic dilatation left supraclinoid internal carotid artery. Severe  stenosis left MCA bifurcation unchanged from the prior study. Multiple areas of branch stenosis left middle cerebral artery. Both anterior cerebral arteries supplied from the left and appear patent. Negative for cerebral aneurysm. IMPRESSION: Distal right vertebral and not visualized probably hypoplastic Severe stenosis right cavernous carotid and moderate stenosis left cavernous carotid. Bilateral MCA disease. No change from the prior study. Electronically Signed   By: Franchot Gallo M.D.   On: 04/16/2015 15:36     Medications:     . amLODipine  5 mg Oral Daily  . aspirin  81 mg Oral Daily  . calcium acetate  1,334 mg Oral TID WC  . cloNIDine  0.1 mg Oral BID  . doxazosin  8 mg Oral QHS  . heparin  5,000 Units Subcutaneous 3 times per day  . hydrALAZINE  100 mg Oral TID  . insulin aspart  0-9 Units Subcutaneous TID WC  . insulin aspart  2 Units Subcutaneous TID AC  . insulin detemir  20 Units Subcutaneous Daily  . metoprolol  50 mg Oral BID  . simvastatin  40 mg Oral QHS  . sodium bicarbonate  650 mg Oral BID   acetaminophen **OR** acetaminophen, ondansetron **OR** ondansetron (ZOFRAN) IV  Assessment/ Plan:  80 y.o. 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 dialysis center by [facility] nephrology, hypertension, diabetes mellitus type 2, COPD, CVA with hemorrhagic infarct in the putamen [facility]  1.  ESRD on HD TTHS:  Patient seen and evaluated during hemodialysis today. Appears to be tolerating well. Complete dialysis today.  2.  Anemia of CKD: Hold epogen given recent CVA, hgb 9.8 at the moment.   3.  Secondary Hyperparathyroidism:  Continue PhosLo 2 tablets by mouth 3 times a day with meals.  4. Diabetes Mellitus type II with chronic kidney disease: Most  recent A1c was quite high at 11.2. Management per hospitalist.  5. Hypertension: Blood pressure 146/63 at last check. -  Continue Amlodipine, clonidine, doxazosin, hydralazine, metoprolol   LOS:  Jason Aguilar 3/21/20172:28 PM

## 2015-04-18 NOTE — Progress Notes (Signed)
Speech Language Pathology Treatment: Dysphagia  Patient Details Name: Jason Aguilar MRN: [id number] DOB: [birth year] Today's Date: 04/18/2015 Time: [id number] SLP Time Calculation (min) (ACUTE ONLY): 40 min  Assessment / Plan / Recommendation Clinical Impression  Jason Aguilar appears to be tolerating his current po diet(regular); exhibited no s/s of aspiration or difficulty w/ mastication of broken down, soft pieces of food representing a Dys. 3 diet consistency during tx session but acknowledged that tougher meats were difficult to chew sec. to missing dentition. Jason Aguilar required tray setup assistance but fed self. Jason Aguilar continues to have a min. congested cough at baseline but not during po trials; Rec. modification oof diet to a Dys. 3 (moistened) w/ thin liquids w/ aspiration precautions and strategy of chew foods well b/f swallowing; continue meds in liquids or in puree - if nec. ST will f/u w/ toleration of diet next 1-3 days. NSG updated and agreed   HPI HPI: Jason Aguilar is a 80 y.o. male with a known history of recent right basilar acute lacunar infarct, ESRD on hemodialysis, GERD, hypertension, DM, and other medical dxs comes to the emergency room brought in by family since patient was not his usual self and was more lethargic and sleepy. Patient had his dialysis today. During my evaluation patient is awake alert and oriented 3 answered all my questions very appropriately. Jason Aguilar was awake, verbally conversive and oriented x3(person, place, year). He stated he was brought to the hospital because he was not feeling well. Noted mild dysarthria of speech - unsure of Jason Aguilar's baseline articulation sec. to missing several teeth. Jason Aguilar followed instruction w/ general verbal cues. Noted slight weakness in coordination of RUE as he fed self but was able to perform task. MRI of brain revealed small hemorrhagic strokes. Jason Aguilar is tolerating his current oral diet w/ no overt s/s of aspiration w/ meals nor when swallowing meds w/ NSG per report. Jason Aguilar  continues to have a congested cough noted when moving about in bed.       SLP Plan  Continue with current plan of care     Recommendations  Diet recommendations: Dysphagia 3 (mechanical soft);Thin liquid Liquids provided via: Cup;Straw Medication Administration: Whole meds with liquid (or w/ puree as needed for easier swallowing) Supervision: Patient able to self feed (setup assist) Compensations: Minimize environmental distractions;Slow rate;Small sips/bites;Follow solids with liquid (chew foods well) Postural Changes and/or Swallow Maneuvers: Seated upright 90 degrees;Upright 30-60 min after meal             General recommendations:  (Dietician TBD) Oral Care Recommendations: Oral care BID;Staff/trained caregiver to provide oral care Follow up Recommendations: Home health SLP;Skilled Nursing facility Plan: Continue with current plan of care     [place], [place], CCC-SLP  Watson,Katherine 04/18/2015, 11:12 AM

## 2015-04-18 NOTE — Progress Notes (Signed)
Patient ID: Jason Aguilar, male   DOB: [birth year], 80 y.o.   MRN: [id number] [place] [id number] DOB: [birth year] DOA: 04/13/2015 PCP: Valera Castle, MD  HPI/Subjective:  Much more alert, asking to go home, due for HD today  Objective: Filed Vitals:   04/17/15 2035 04/18/15 0527  BP: 160/52 135/48  Pulse: 58 55  Temp: 98.1 F (36.7 C) 97.5 F (36.4 C)  Resp: 16 16    Filed Weights   04/15/15 0508 04/15/15 1636 04/15/15 1949  Weight: 60.51 kg (133 lb 6.4 oz) 60.328 kg (133 lb) 58.4 kg (128 lb 12 oz)    ROS: Review of Systems  Constitutional: Positive for malaise/fatigue. Negative for fever and chills.  Eyes: Negative for blurred vision.  Respiratory: Negative for cough and shortness of breath.   Cardiovascular: Negative for chest pain.  Gastrointestinal: Negative for nausea, vomiting, abdominal pain, diarrhea and constipation.  Genitourinary: Negative for dysuria.  Musculoskeletal: Negative for joint pain.  Neurological: Positive for weakness. Negative for dizziness and headaches.   Exam: Physical Exam  Constitutional: He is oriented to person, place, and time.  HENT:  Nose: No mucosal edema.  Mouth/Throat: No oropharyngeal exudate or posterior oropharyngeal edema.  Eyes: Conjunctivae, EOM and lids are normal. Pupils are equal, round, and reactive to light.  Neck: No JVD present. Carotid bruit is not present. No edema present. No thyroid mass and no thyromegaly present.  Cardiovascular: S1 normal and S2 normal.  Exam reveals no gallop.   No murmur heard. Pulses:      Dorsalis pedis pulses are 2+ on the right side, and 2+ on the left side.  Respiratory: No respiratory distress. He has no wheezes. He has no rhonchi. He has no rales.  GI: Soft. Bowel sounds are normal. There is no tenderness.  Musculoskeletal:       Right ankle: He exhibits no swelling.       Left ankle: He exhibits no swelling.  Lymphadenopathy:     He has no cervical adenopathy.  Neurological: He is alert and oriented to person, place, and time.  Patient with slurred speech and left facial droop. 4 out of 5 weakness on the right upper extremity. Other extremities 5 out of 5 power. Sensation intact to light touch on face bilaterally  Skin: Skin is warm. No rash noted. Nails show no clubbing.  Psychiatric: He has a normal mood and affect.      Data Reviewed: Basic Metabolic Panel:  Recent Labs Lab 04/13/15 1833 04/14/15 0346 04/15/15 1614  NA 135  --   --   K 3.5  --   --   CL 102  --   --   CO2 26  --   --   GLUCOSE 188*  --   --   BUN 10  --   --   CREATININE 2.04* 2.97*  --   CALCIUM 9.2  --   --   PHOS  --   --  3.8   CBC:  Recent Labs Lab 04/13/15 1833 04/14/15 0346 04/18/15 0616  WBC 11.5* 9.3 9.5  NEUTROABS 9.9*  --   --   HGB 10.1* 9.8* 9.9*  HCT 30.6* 29.0* 29.2*  MCV 91.4 89.7 89.5  PLT 234 227 194   Cardiac Enzymes:  Recent Labs Lab 04/13/15 1833  TROPONINI <0.03   BNP (last 3 results)  Recent Labs  01/02/15 1924  BNP 1454.0*   CBG:  Recent Labs  Lab 04/17/15 0807 04/17/15 1131 04/17/15 1639 04/17/15 2130 04/18/15 0719  GLUCAP 126* 182* 137* 120* 86    Studies: Mr Jodene Nam Head Wo Contrast  04/16/2015  CLINICAL DATA:  Stroke. EXAM: MRA HEAD WITHOUT CONTRAST TECHNIQUE: Angiographic images of the Circle of Willis were obtained using MRA technique without intravenous contrast. COMPARISON:  MRA 04/03/2015.  MRI 04/15/2015 FINDINGS: Distal right vertebral artery not visualized and may be hypoplastic or end in PICA. This is unchanged from the prior study. Left vertebral artery patent to the basilar. PICA patent on the left but not visualized on the right. Basilar widely patent. Superior cerebellar and posterior cerebral arteries patent without significant stenosis. Patent right posterior communicating artery. Severe tandem stenosis right cavernous carotid unchanged. Post stenotic dilatation  supraclinoid internal carotid artery on the right unchanged. Hypoplastic right A1 segment unchanged. Moderate stenosis right M1 segment with scattered atherosclerotic disease right middle cerebral artery branches Moderate stenosis left cavernous carotid. Mild poststenotic dilatation left supraclinoid internal carotid artery. Severe stenosis left MCA bifurcation unchanged from the prior study. Multiple areas of branch stenosis left middle cerebral artery. Both anterior cerebral arteries supplied from the left and appear patent. Negative for cerebral aneurysm. IMPRESSION: Distal right vertebral and not visualized probably hypoplastic Severe stenosis right cavernous carotid and moderate stenosis left cavernous carotid. Bilateral MCA disease. No change from the prior study. Electronically Signed   By: Franchot Gallo M.D.   On: 04/16/2015 15:36    Scheduled Meds: . amLODipine  5 mg Oral Daily  . aspirin  81 mg Oral Daily  . calcium acetate  1,334 mg Oral TID WC  . cloNIDine  0.1 mg Oral BID  . doxazosin  8 mg Oral QHS  . heparin  5,000 Units Subcutaneous 3 times per day  . hydrALAZINE  100 mg Oral TID  . insulin aspart  0-9 Units Subcutaneous TID WC  . insulin aspart  2 Units Subcutaneous TID AC  . insulin detemir  20 Units Subcutaneous Daily  . metoprolol  50 mg Oral BID  . simvastatin  40 mg Oral QHS  . sodium bicarbonate  650 mg Oral BID    Assessment/Plan:  1. Acute hemorrhagic strokes, subacute stroke: Continue aspirin and simvastatin. OT recommends STR/SNF, await PT reeval, speech therapy consultations also. discontinue telemetry. Patient had previous echocardiogram and carotid ultrasound. Based on Neuro recommendations - stopping seroquel due to lethargy, it seem to help his mental status, he is much more awake today 2. End-stage renal disease continue dialysis as per nephrology. Plan for HD today 3. Essential hypertension-patient on numerous blood pressure medications. Rather her blood  pressure a little high than too low. May be due to lack of HD it's running high, it may drop post HD 4. Type 2 diabetes continue Levemir insulin and sliding scale 5. Parkinson's disease on Sinemet 6. Weakness- physical therapy REevaluation  Code Status: Full Code Family Communication: Spoke with the wife Jason Aguilar on phone [phone]) Disposition Plan: STR/SNF  Consultants:  Neurology  Time spent: 25 minutes  [facility], [place] [facility]

## 2015-04-19 LAB — GLUCOSE, CAPILLARY
GLUCOSE-CAPILLARY: 246 mg/dL — AB (ref 65–99)
GLUCOSE-CAPILLARY: 111 mg/dL — AB (ref 65–99)
Glucose-Capillary: 147 mg/dL — ABNORMAL HIGH (ref 65–99)
Glucose-Capillary: 201 mg/dL — ABNORMAL HIGH (ref 65–99)
Glucose-Capillary: 267 mg/dL — ABNORMAL HIGH (ref 65–99)

## 2015-04-19 MED ORDER — INSULIN DETEMIR 100 UNIT/ML ~~LOC~~ SOLN
15.0000 [IU] | Freq: Every day | SUBCUTANEOUS | Status: DC
  Administered 2015-04-20 – 2015-04-21 (×2): 15 [IU] via SUBCUTANEOUS
  Filled 2015-04-19 (×2): qty 0.15

## 2015-04-19 NOTE — Progress Notes (Signed)
Pt was able to successfully ambulate to the bathroom this afternoon with the NT.  Pt continues to be weak, however, family was in room and offered encouragement.    Will continue to monitor and notify MD of any changes.

## 2015-04-19 NOTE — NC FL2 (Signed)
[place] LEVEL OF CARE SCREENING TOOL     IDENTIFICATION  Patient Name: Jason Aguilar Birthdate: 21-Oct-1935 Sex: male Admission Date (Current Location): 04/13/2015  [facility] and [place] Number:  Engineering geologist and Address:  [facility], [address], [place], [place] [address]      Provider Number: [id number]  Attending Physician Name and Address:  Max Sane, MD  Relative Name and Phone Number:       Current Level of Care: Hospital Recommended Level of Care: [place] Prior Approval Number:    Date Approved/Denied:   PASRR Number:    Discharge Plan: SNF    Current Diagnoses: Patient Active Problem List   Diagnosis Date Noted  . CVA (cerebral infarction) 04/15/2015  . Altered mental status 04/13/2015  . Basal ganglia infarction ([place]) 03/31/2015  . Acute delirium 03/29/2015  . Altered mental state 03/28/2015  . Acute diastolic CHF (congestive heart failure) ([place]) 01/06/2015  . ESRD on dialysis ([place]) 01/06/2015  . Hypertensive urgency 01/02/2015  . Chest pain, localized 09/02/2011  . Adrenal nodule ([place]) 07/04/2011  . Cataract due to secondary diabetes mellitus ([place])   . Glaucoma   . COPD (chronic obstructive pulmonary disease) ([place])   . History of tobacco abuse   . Gout attack 04/12/2011  . Back pain, lumbosacral 04/07/2011  . Diabetes mellitus type II, uncontrolled ([place]) 04/07/2011  . transitional cell, bladder   . Nephropathy due to secondary diabetes mellitus ([place]) 10/21/2010  . Hypertension   . Hyperlipidemia   . Obstructive sleep apnea of adult   . Noncompliance with medication regimen     Orientation RESPIRATION BLADDER Height & Weight     Self, Situation, Place  Normal Continent Weight: 131 lb 12.8 oz (59.784 kg) Height:  5\' 5"  (165.1 cm)  BEHAVIORAL SYMPTOMS/MOOD NEUROLOGICAL BOWEL NUTRITION STATUS   (none)  (tremors) Continent Diet (renal)  AMBULATORY STATUS COMMUNICATION OF NEEDS  Skin   Supervision Verbally Normal                       Personal Care Assistance Level of Assistance  Bathing, Dressing Bathing Assistance: Limited assistance   Dressing Assistance: Limited assistance     Functional Limitations Info             SPECIAL CARE FACTORS FREQUENCY  PT (By licensed PT), OT (By licensed OT) (dialysis)                    Contractures Contractures Info: Not present    Additional Factors Info                  Current Medications (04/19/2015):  This is the current hospital active medication list Current Facility-Administered Medications  Medication Dose Route Frequency Provider Last Rate Last Dose  . acetaminophen (TYLENOL) tablet 650 mg  650 mg Oral Q6H PRN Fritzi Mandes, MD       Or  . acetaminophen (TYLENOL) suppository 650 mg  650 mg Rectal Q6H PRN Fritzi Mandes, MD      . amLODipine (NORVASC) tablet 5 mg  5 mg Oral Daily Fritzi Mandes, MD   5 mg at 04/19/15 1140  . aspirin chewable tablet 81 mg  81 mg Oral Daily Fritzi Mandes, MD   81 mg at 04/19/15 1141  . calcium acetate (PHOSLO) capsule 1,334 mg  1,334 mg Oral TID WC Fritzi Mandes, MD   1,334 mg at 04/19/15 1154  . cloNIDine (  CATAPRES) tablet 0.1 mg  0.1 mg Oral BID Fritzi Mandes, MD   0.1 mg at 04/19/15 1153  . doxazosin (CARDURA) tablet 8 mg  8 mg Oral QHS Fritzi Mandes, MD   8 mg at 04/18/15 2208  . heparin injection 5,000 Units  5,000 Units Subcutaneous 3 times per day Fritzi Mandes, MD   5,000 Units at 04/19/15 1315  . hydrALAZINE (APRESOLINE) tablet 100 mg  100 mg Oral TID Fritzi Mandes, MD   100 mg at 04/19/15 1140  . insulin aspart (novoLOG) injection 0-9 Units  0-9 Units Subcutaneous TID WC Fritzi Mandes, MD   3 Units at 04/19/15 1140  . insulin aspart (novoLOG) injection 2 Units  2 Units Subcutaneous TID [facility] Darylene Price [place], [facility]   2 Units at 04/19/15 1154  . insulin detemir (LEVEMIR) injection 20 Units  20 Units Subcutaneous Daily Fritzi Mandes, MD   20 Units at 04/19/15 1223  . metoprolol (LOPRESSOR)  tablet 50 mg  50 mg Oral BID Fritzi Mandes, MD   50 mg at 04/19/15 1140  . ondansetron (ZOFRAN) tablet 4 mg  4 mg Oral Q6H PRN Fritzi Mandes, MD       Or  . ondansetron (ZOFRAN) injection 4 mg  4 mg Intravenous Q6H PRN Fritzi Mandes, MD      . simvastatin (ZOCOR) tablet 40 mg  40 mg Oral QHS Fritzi Mandes, MD   40 mg at 04/18/15 2208  . sodium bicarbonate tablet 650 mg  650 mg Oral BID Fritzi Mandes, MD   650 mg at 04/19/15 1140     Discharge Medications: Please see discharge summary for a list of discharge medications.  Relevant Imaging Results:  Relevant Lab Results:   Additional Information SS: [id number]  Shela Leff, LCSW

## 2015-04-19 NOTE — Clinical Social Work Note (Signed)
CSW spoke with patient's wife and she wishes for patient to do STR. Bedsearch initiated. Patient ready today. Will await bed offers. Shela Leff MSW,LCSW [phone]

## 2015-04-19 NOTE — Progress Notes (Signed)
[facility]  ROUNDING NOTE   Subjective:  Patient completed dialysis yesterday. Resting comfortably in bed at the moment. Patient was hypoglycemic yesterday evening.    Objective:  Vital signs in last 24 hours:  Temp:  [97.7 F (36.5 C)-98.4 F (36.9 C)] 98.4 F (36.9 C) (03/22 0441) Pulse Rate:  [57-78] 61 (03/22 0441) Resp:  [10-18] 17 (03/22 0441) BP: (137-170)/(40-67) 137/60 mmHg (03/22 0441) SpO2:  [91 %-98 %] 97 % (03/22 0441) Weight:  [57.8 kg (127 lb 6.8 oz)-59.784 kg (131 lb 12.8 oz)] 59.784 kg (131 lb 12.8 oz) (03/21 1718)  Weight change:  Filed Weights   04/18/15 1310 04/18/15 1700 04/18/15 1718  Weight: 58.4 kg (128 lb 12 oz) 57.8 kg (127 lb 6.8 oz) 59.784 kg (131 lb 12.8 oz)    Intake/Output: I/O last 3 completed shifts: In: 1080 [P.O.:1080] Out: 2500 [Urine:1000; Other:1500]   Intake/Output this shift:  Total I/O In: 290 [P.O.:290] Out: -   Physical Exam: General: NAD, resting in bed  Head:  Moist oral mucosal membranes  Eyes: Anicteric  Neck: Supple, trachea midline  Lungs:  Clear to auscultation, normal effort  Heart: Regular rate and rhythm no rubs  Abdomen:  Soft, nontender, BS present  Extremities: No peripheral edema.  Neurologic: Awake, alert, following simple commands  Skin: No lesions  Access: R IJ permcath    Basic Metabolic Panel:  Recent Labs Lab 04/13/15 1833 04/14/15 0346 04/15/15 1614  NA 135  --   --   K 3.5  --   --   CL 102  --   --   CO2 26  --   --   GLUCOSE 188*  --   --   BUN 10  --   --   CREATININE 2.04* 2.97*  --   CALCIUM 9.2  --   --   PHOS  --   --  3.8    Liver Function Tests: No results for input(s): AST, ALT, ALKPHOS, BILITOT, PROT, ALBUMIN in the last 168 hours. No results for input(s): LIPASE, AMYLASE in the last 168 hours. No results for input(s): AMMONIA in the last 168 hours.  CBC:  Recent Labs Lab 04/13/15 1833 04/14/15 0346 04/18/15 0616  WBC 11.5* 9.3 9.5  NEUTROABS  9.9*  --   --   HGB 10.1* 9.8* 9.9*  HCT 30.6* 29.0* 29.2*  MCV 91.4 89.7 89.5  PLT 234 227 194    Cardiac Enzymes:  Recent Labs Lab 04/13/15 1833  TROPONINI <0.03    BNP: Invalid input(s): POCBNP  CBG:  Recent Labs Lab 04/18/15 1751 04/18/15 2146 04/18/15 2216 04/18/15 2353 04/19/15 0720  GLUCAP 90 55* 29 114* 147*    Microbiology: Results for orders placed or performed during the hospital encounter of 03/03/15  Surgical pcr screen     Status: Abnormal   Collection Time: 03/03/15  2:39 PM  Result Value Ref Range Status   MRSA, PCR NEGATIVE NEGATIVE Final   Staphylococcus aureus POSITIVE (A) NEGATIVE Final    Coagulation Studies: No results for input(s): LABPROT, INR in the last 72 hours.  Urinalysis: No results for input(s): COLORURINE, LABSPEC, PHURINE, GLUCOSEU, HGBUR, BILIRUBINUR, KETONESUR, PROTEINUR, UROBILINOGEN, NITRITE, LEUKOCYTESUR in the last 72 hours.  Invalid input(s): APPERANCEUR    Imaging: No results found.   Medications:     . amLODipine  5 mg Oral Daily  . aspirin  81 mg Oral Daily  . calcium acetate  1,334 mg Oral TID WC  . cloNIDine  0.1 mg Oral BID  . doxazosin  8 mg Oral QHS  . heparin  5,000 Units Subcutaneous 3 times per day  . hydrALAZINE  100 mg Oral TID  . insulin aspart  0-9 Units Subcutaneous TID WC  . insulin aspart  2 Units Subcutaneous TID AC  . insulin detemir  20 Units Subcutaneous Daily  . metoprolol  50 mg Oral BID  . simvastatin  40 mg Oral QHS  . sodium bicarbonate  650 mg Oral BID   acetaminophen **OR** acetaminophen, ondansetron **OR** ondansetron (ZOFRAN) IV  Assessment/ Plan:  80 y.o. 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 dialysis center by [facility] nephrology, hypertension, diabetes mellitus type 2, COPD, CVA  with hemorrhagic infarct in the putamen [facility]  1.  ESRD on HD TTHS:  Patient completed dialysis yesterday. No acute indication for dialysis today. We will plan for dialysis again tomorrow if patient still here.  2.  Anemia of CKD: We are holding Epogen for now given recent CVA. Hemoglobin currently 9.9 and acceptable.   3.  Secondary Hyperparathyroidism: Continue PhosLo 2 tablets by mouth 3 times a day with meals.  4. Diabetes Mellitus type II with chronic kidney disease: Most recent A1c was quite high at 11.2. Management per hospitalist.  5. Hypertension: Blood pressure normal this a.m. at 137/60.. -  Continue Amlodipine, clonidine, doxazosin, hydralazine, metoprolol   LOS:  Arvil Utz 3/22/201710:41 AM

## 2015-04-19 NOTE — Progress Notes (Signed)
Inpatient Diabetes Program Recommendations  AACE/ADA: New Consensus Statement on Inpatient Glycemic Control (2015)  Target Ranges:  Prepandial:   less than 140 mg/dL      Peak postprandial:   less than 180 mg/dL (1-2 hours)      Critically ill patients:  140 - 180 mg/dL  Results for STONEWALL, CATTANI (MRN [id number]) as of 04/19/2015 11:19  Ref. Range 04/18/2015 17:51 04/18/2015 21:46 04/18/2015 22:16 04/18/2015 23:53 04/19/2015 07:20  Glucose-Capillary Latest Ref Range: 65-99 mg/dL 90 58 (L) 67 114 (H) 147 (H)   Diabetes history: DM2 Outpatient Diabetes medications: Levemir 20 units daily + Novolog 2 units tid ac Current orders for Inpatient glycemic control: Levemir 20 units q d + Novolog correction sensitive 0-9 qid  Inpatient Diabetes Program Recommendations:  Please consider decrease of Levemir 15 units daily while in the hospital. Thank you,  Nani Gasser. Ilyse Tremain, RN, MSN, CDE Inpatient Glycemic Control Team Team Pager [phone] (8am-5pm) 04/19/2015 11:23 AM

## 2015-04-19 NOTE — Progress Notes (Signed)
Occupational Therapy Treatment Patient Details Name: Jason Aguilar MRN: [id number] DOB: [birth year] Today's Date: 04/19/2015    History of present illness Pt c AMS, lethargy; head CT (-), MRI (3/18) (+) subacute infarct after PT evaluation per MRI note, and neuro note from later yesterday identifies an acute lesion as well.  Of note, patient with recent hospitalziation (approx 2-3 weeks prior) with R lacunar infarct, AMS; discharged home with outpatient PT. At baseline, pt reports household distance AMB with QC for >1 year. Pt on HD T/R/Sa. MRA pending..   OT comments  Pt. Is more awake, and more attentive to session today. Pt. Is improving with BADL tasks, however continues to have difficulty performing LE dressing. Pt. conitnues to benefit from skilled OT services to improve ADL and IADL skills and assist in returning to his PLOF.  Follow Up Recommendations  SNF    Equipment Recommendations       Recommendations for Other Services      Precautions / Restrictions Precautions Precautions: Fall Precaution Comments: R chest perm-cath       Mobility Bed Mobility  Pt. Requires CGA supine to sit to the EOB. MinA scooting up to the [facility].                Transfers                      Balance                                   ADL                                         General ADL Comments: Pt. requires ModA LE dressing skills, minA gown management, minA for set-up of self-grooming tasks.      Vision                     Perception     Praxis      Cognition   Behavior During Therapy: Flat affect Overall Cognitive Status: Within Functional Limits for tasks assessed Area of Impairment: Safety/judgement                     Extremity/Trunk Assessment               Exercises     Shoulder Instructions       General Comments      Pertinent Vitals/ Pain       Pain Assessment: 0-10 Pain Score: 3   Pain Location: Right sided rib pain Pain Descriptors / Indicators: Aching  Home Living                                          Prior Functioning/Environment              Frequency Min 1X/week     Progress Toward Goals  OT Goals(current goals can now be found in the care plan section)        Plan Frequency remains appropriate    Co-evaluation                 End of Session     Activity Tolerance  Pt. tolerated treatment session well.   Patient Left  Pt. left in bed, with alarm set, and call bell in place.   Nurse Communication          Time: [id number] OT Time Calculation (min): 23 min  Charges: OT General Charges $OT Visit: 1 Procedure OT Treatments $Self Care/Home Management : 23-37 mins  Harrel Carina, MS, OTR/L  Harrel Carina 04/19/2015, 11:10 AM

## 2015-04-19 NOTE — Clinical Social Work Note (Signed)
MD has decided that patient is not ready for discharge. Bed offers are from [facility], [facility], [facility], and [facility]. CSW met with patient's wife and daughter and extended the bed offers. Patient's wife and daughter would like to take him home as of this afternoon but will look at the facilities and give me a decision in the morning. Shela Leff MSW,LCSW [phone]

## 2015-04-19 NOTE — Progress Notes (Signed)
PT Cancellation Note  Patient Details Name: KOLLEN LOU MRN: [id number] DOB: March 11, 1935   Cancelled Treatment:    Reason Eval/Treat Not Completed: Fatigue/lethargy limiting ability to participate. Treatment attempted; pt unable to remain awake to participate/maintain a conversation to effectively participate with PT. Pt does perform a few ankle pumps, but unable to continue and falls sound asleep despite several attempts at awakening.    Charlaine Dalton, [place] 04/19/2015, 11:44 AM

## 2015-04-19 NOTE — Progress Notes (Signed)
Orders to hold metoprolol and hydralazine this evening pr dr.willis when informed of  B/p and hr

## 2015-04-19 NOTE — Progress Notes (Addendum)
Dr. Manuella Ghazi notified of drop in Blood Pressure.   At 1140 Manual BP was 156/58 and Pt was administered scheduled BP Meds (Norvasc, Metoprolol, Hydralazine SEE MAR).  At 1307 Manual BP was 100/38 HR 60 Oxygen 94% on RA and afebrile.  Pt lethargic and arouses to voice.  Pt able to answer orientation questions correctly.    BG 267.  Pt had dialysis yesterday. Of note, BP trend shows a drop the day after dialysis (See VS Flowsheet).     Will re-check BP in 30 minutes and continue to monitor.  Will notify MD of any changes.

## 2015-04-19 NOTE — Progress Notes (Signed)
Patient ID: Jason Aguilar, male   DOB: 07/07/1935, 80 y.o.   MRN: [id number] [facility] PROGRESS NOTE  OMARI KINNARD [id number] DOB: Jan 19, 1936 DOA: 04/13/2015 PCP: Valera Castle, MD  HPI/Subjective: Was alert earlier when I saw and talking but later called by nursing as he got lethargic and BP dropped.  Objective: Filed Vitals:   04/19/15 1313 04/19/15 1403  BP: 100/38 122/50  Pulse: 60   Temp:    Resp:      Filed Weights   04/18/15 1310 04/18/15 1700 04/18/15 1718  Weight: 58.4 kg (128 lb 12 oz) 57.8 kg (127 lb 6.8 oz) 59.784 kg (131 lb 12.8 oz)    ROS: Review of Systems  Constitutional: Positive for malaise/fatigue. Negative for fever and chills.  Eyes: Negative for blurred vision.  Respiratory: Negative for cough and shortness of breath.   Cardiovascular: Negative for chest pain.  Gastrointestinal: Negative for nausea, vomiting, abdominal pain, diarrhea and constipation.  Genitourinary: Negative for dysuria.  Musculoskeletal: Negative for joint pain.  Neurological: Positive for weakness. Negative for dizziness and headaches.   Exam: Physical Exam  HENT:  Nose: No mucosal edema.  Mouth/Throat: No oropharyngeal exudate or posterior oropharyngeal edema.  Eyes: Conjunctivae, EOM and lids are normal. Pupils are equal, round, and reactive to light.  Neck: No JVD present. Carotid bruit is not present. No edema present. No thyroid mass and no thyromegaly present.  Cardiovascular: S1 normal and S2 normal.  Exam reveals no gallop.   No murmur heard. Pulses:      Dorsalis pedis pulses are 2+ on the right side, and 2+ on the left side.  Respiratory: No respiratory distress. He has no wheezes. He has no rhonchi. He has no rales.  GI: Soft. Bowel sounds are normal. There is no tenderness.  Musculoskeletal:       Right ankle: He exhibits no swelling.       Left ankle: He exhibits no swelling.  Lymphadenopathy:    He has no cervical adenopathy.   Neurological: He is alert.  Patient with slurred speech and left facial droop. 4 out of 5 weakness on the right upper extremity. Other extremities 5 out of 5 power. Sensation intact to light touch on face bilaterally  Skin: Skin is warm. No rash noted. Nails show no clubbing.  Psychiatric: He has a normal mood and affect.      Data Reviewed: Basic Metabolic Panel:  Recent Labs Lab 04/13/15 1833 04/14/15 0346 04/15/15 1614  NA 135  --   --   K 3.5  --   --   CL 102  --   --   CO2 26  --   --   GLUCOSE 188*  --   --   BUN 10  --   --   CREATININE 2.04* 2.97*  --   CALCIUM 9.2  --   --   PHOS  --   --  3.8   CBC:  Recent Labs Lab 04/13/15 1833 04/14/15 0346 04/18/15 0616  WBC 11.5* 9.3 9.5  NEUTROABS 9.9*  --   --   HGB 10.1* 9.8* 9.9*  HCT 30.6* 29.0* 29.2*  MCV 91.4 89.7 89.5  PLT 234 227 194   Cardiac Enzymes:  Recent Labs Lab 04/13/15 1833  TROPONINI <0.03   BNP (last 3 results)  Recent Labs  01/02/15 1924  BNP 1454.0*   CBG:  Recent Labs Lab 04/18/15 2216 04/18/15 2353 04/19/15 0720 04/19/15 1128 04/19/15 1326  GLUCAP  67 114* 147* 201* 267*    Studies: No results found.  Scheduled Meds: . amLODipine  5 mg Oral Daily  . aspirin  81 mg Oral Daily  . calcium acetate  1,334 mg Oral TID WC  . cloNIDine  0.1 mg Oral BID  . doxazosin  8 mg Oral QHS  . heparin  5,000 Units Subcutaneous 3 times per day  . hydrALAZINE  100 mg Oral TID  . insulin aspart  0-9 Units Subcutaneous TID WC  . insulin aspart  2 Units Subcutaneous TID AC  . [START ON 04/20/2015] insulin detemir  15 Units Subcutaneous Daily  . metoprolol  50 mg Oral BID  . simvastatin  40 mg Oral QHS  . sodium bicarbonate  650 mg Oral BID    Assessment/Plan:  1. Acute hemorrhagic strokes, subacute stroke: Continue aspirin and simvastatin. OT recommends STR/SNF, await PT reeval, speech therapy consultations also. Off seroquel. He gets lethargic which seem to correspond with  hypotension which may be due to HD y'day as normally he has high BP. Will monitor. 2. End-stage renal disease continue dialysis as per nephrology. Plan for HD today 3. Essential hypertension-patient on numerous blood pressure medications. Rather her blood pressure a little high than too low. May be due to lack of HD it's running high, it may drop post HD 4. Type 2 diabetes continue Levemir insulin and sliding scale 5. Parkinson's disease on Sinemet 6. Metabolic encephalopathy: likely multifactorial. Check ammonia.  7. Weakness- physical therapy REevaluation  Code Status: Full Code Family Communication: Spoke with the wife Lovey Newcomer on phone [phone]) Disposition Plan: STR/SNF vs home   Consultants:  Neurology  Time spent: 25 minutes  [facility], [place] [facility]

## 2015-04-20 ENCOUNTER — Other Ambulatory Visit: Payer: Self-pay

## 2015-04-20 LAB — RENAL FUNCTION PANEL
ALBUMIN: 3 g/dL — AB (ref 3.5–5.0)
Anion gap: 10 (ref 5–15)
BUN: 46 mg/dL — AB (ref 6–20)
CALCIUM: 10.1 mg/dL (ref 8.9–10.3)
CO2: 30 mmol/L (ref 22–32)
Chloride: 96 mmol/L — ABNORMAL LOW (ref 101–111)
Creatinine, Ser: 5.66 mg/dL — ABNORMAL HIGH (ref 0.61–1.24)
GFR calc Af Amer: 10 mL/min — ABNORMAL LOW (ref >60)
GFR, EST NON AFRICAN AMERICAN: 9 mL/min — AB (ref >60)
Glucose, Bld: 178 mg/dL — ABNORMAL HIGH (ref 65–99)
PHOSPHORUS: 4 mg/dL (ref 2.5–4.6)
POTASSIUM: 4.3 mmol/L (ref 3.5–5.1)
SODIUM: 136 mmol/L (ref 135–145)

## 2015-04-20 LAB — GLUCOSE, CAPILLARY
GLUCOSE-CAPILLARY: 76 mg/dL (ref 65–99)
Glucose-Capillary: 110 mg/dL — ABNORMAL HIGH (ref 65–99)

## 2015-04-20 NOTE — Progress Notes (Signed)
Post hd tx 

## 2015-04-20 NOTE — Progress Notes (Signed)
Hemodialysis start 

## 2015-04-20 NOTE — Progress Notes (Signed)
Subjective: Patient off Risperdal.  No further chorea.    Objective: Current vital signs: BP 168/60 mmHg  Pulse 67  Temp(Src) 97.9 F (36.6 C) (Oral)  Resp 16  Ht 5\' 5"  (1.651 m)  Wt 59.7 kg (131 lb 9.8 oz)  BMI 21.90 kg/m2  SpO2 95% Vital signs in last 24 hours: Temp:  [97.7 F (36.5 C)-98.7 F (37.1 C)] 97.9 F (36.6 C) (03/23 1454) Pulse Rate:  [50-80] 67 (03/23 1454) Resp:  [14-22] 16 (03/23 1454) BP: (128-171)/(31-66) 168/60 mmHg (03/23 1454) SpO2:  [94 %-100 %] 95 % (03/23 1454) Weight:  [59.7 kg (131 lb 9.8 oz)-61 kg (134 lb 7.7 oz)] 59.7 kg (131 lb 9.8 oz) (03/23 1454)  Intake/Output from previous day: 03/22 0701 - 03/23 0700 In: 650 [P.O.:650] Out: 0  Intake/Output this shift: Total I/O In: 360 [P.O.:360] Out: 1500 [Other:1500] Nutritional status: Diet renal/carb modified with fluid restriction Diet-HS Snack?: Nothing; Room service appropriate?: Yes with Assist; Fluid consistency:: Thin  Neurologic Exam: Mental Status: Lethargic but arousable. Speech fluent without evidence of aphasia but dysarthric. Cranial Nerves: II: Discs flat bilaterally; Visual fields grossly normal, pupils equal, round, reactive to light and accommodation III,IV, VI: ptosis not present, extra-ocular motions intact bilaterally V,VII: right facial droop, facial light touch sensation normal bilaterally VIII: hearing normal bilaterally IX,X: gag reflex present XI: bilateral shoulder shrug XII: tongue deviation to the right Motor: Right :Upper extremity 5/5Left: Upper extremity 5/5 Lower extremity 5/5Lower extremity 5/5  Lab Results: Basic Metabolic Panel:  Recent Labs Lab 04/13/15 1833 04/14/15 0346 04/15/15 1614 04/20/15 1211  NA 135  --   --  136  K 3.5  --   --  4.3  CL 102  --   --  96*  CO2 26  --   --  30  GLUCOSE 188*  --   --  178*  BUN 10  --   --  46*   CREATININE 2.04* 2.97*  --  5.66*  CALCIUM 9.2  --   --  10.1  PHOS  --   --  3.8 4.0    Liver Function Tests:  Recent Labs Lab 04/20/15 1211  ALBUMIN 3.0*   No results for input(s): LIPASE, AMYLASE in the last 168 hours. No results for input(s): AMMONIA in the last 168 hours.  CBC:  Recent Labs Lab 04/13/15 1833 04/14/15 0346 04/18/15 0616  WBC 11.5* 9.3 9.5  NEUTROABS 9.9*  --   --   HGB 10.1* 9.8* 9.9*  HCT 30.6* 29.0* 29.2*  MCV 91.4 89.7 89.5  PLT 234 227 194    Cardiac Enzymes:  Recent Labs Lab 04/13/15 1833  TROPONINI <0.03    Lipid Panel: No results for input(s): CHOL, TRIG, HDL, CHOLHDL, VLDL, LDLCALC in the last 168 hours.  CBG:  Recent Labs Lab 04/19/15 1128 04/19/15 1326 04/19/15 1621 04/19/15 2111 04/20/15 0737  GLUCAP 201* 267* 246* 111* 110*    Microbiology: Results for orders placed or performed during the hospital encounter of 03/03/15  Surgical pcr screen     Status: Abnormal   Collection Time: 03/03/15  2:39 PM  Result Value Ref Range Status   MRSA, PCR NEGATIVE NEGATIVE Final   Staphylococcus aureus POSITIVE (A) NEGATIVE Final    Coagulation Studies: No results for input(s): LABPROT, INR in the last 72 hours.  Imaging: No results found.  Medications:  I have reviewed the patient's current medications. Scheduled: . amLODipine  5 mg Oral Daily  . aspirin  81  mg Oral Daily  . calcium acetate  1,334 mg Oral TID WC  . cloNIDine  0.1 mg Oral BID  . doxazosin  8 mg Oral QHS  . heparin  5,000 Units Subcutaneous 3 times per day  . hydrALAZINE  100 mg Oral TID  . insulin aspart  0-9 Units Subcutaneous TID WC  . insulin aspart  2 Units Subcutaneous TID AC  . insulin detemir  15 Units Subcutaneous Daily  . metoprolol  50 mg Oral BID  . simvastatin  40 mg Oral QHS  . sodium bicarbonate  650 mg Oral BID    Assessment/Plan: Patient stable.  Initial EEG with quite a bit of artifact.  Read as atrial fibrillation but it is  felt that p-waves were present.  EEG repeated today and evaluated.  It was felt that the patient did not have atrial fibrillation.  Therefore will not start anticoagulation.    Recommendations: 1.  Would continue on ASA and restart Plavix as recommended.     LOS: 5 days   Alexis Goodell, MD Neurology [phone] 04/20/2015  3:38 PM

## 2015-04-20 NOTE — Progress Notes (Signed)
Inpatient Diabetes Program Recommendations  AACE/ADA: New Consensus Statement on Inpatient Glycemic Control (2015)  Target Ranges:  Prepandial:   less than 140 mg/dL      [facility] postprandial:   less than 180 mg/dL (1-2 hours)      Critically ill patients:  140 - 180 mg/dL   Review of Glycemic Control  Results for JAMIEON, DOLMAN (MRN [id number]) as of 04/20/2015 12:49  Ref. Range 04/19/2015 11:28 04/19/2015 13:26 04/19/2015 16:21 04/19/2015 21:11 04/20/2015 07:37  Glucose-Capillary Latest Ref Range: 65-99 mg/dL 201 (H) 267 (H) 246 (H) 111 (H) 110 (H)    Diabetes history: DM2 Outpatient Diabetes medications: Levemir 20 units daily + Novolog 2 units tid ac Current orders for Inpatient glycemic control: Levemir 15 units q d + Novolog correction sensitive 0-9 qid, Novolog 2 units tid with meals  Inpatient Diabetes Program Recommendations:  Please ensure that Novolog insulin is given at least 4 hours apart.  Patient has renal failure and may not excrete insulin readily causing hypoglycemia if excess amounts are given or if given too close together.   Gentry Fitz, RN, BA, MHA, CDE Diabetes Coordinator Inpatient Diabetes Program  [phone] (Team Pager) [phone] ([place]) 04/20/2015 12:52 PM

## 2015-04-20 NOTE — Care Management Important Message (Signed)
Important Message  Patient Details  Name: Jason Aguilar MRN: [id number] Date of Birth: [birth year]   Medicare Important Message Given:  Yes    Juliann Pulse A Jakayla Schweppe 04/20/2015, 12:32 PM

## 2015-04-20 NOTE — Progress Notes (Signed)
Patient ID: Jason Aguilar, male   DOB: 02-08-1935, 80 y.o.   MRN: [facility]ilar [facility] PROGRESS NOTE  Jason Aguilar Jason Aguilar DOB: 03/03/1935 DOA: 04/13/2015 PCP: Jason Castle, MD  HPI/Subjective: Getting more alert.   Objective: Filed Vitals:   04/20/15 1454 04/20/15 1545  BP: 168/60 155/45  Pulse: 67 70  Temp: 97.9 F (36.6 C) 97.6 F (36.4 C)  Resp: 16     Filed Weights   04/18/15 1718 04/20/15 1115 04/20/15 1454  Weight: 59.784 kg (131 lb 12.8 oz) 61 kg (134 lb 7.7 oz) 59.7 kg (131 lb 9.8 oz)    ROS: Review of Systems  Constitutional: Positive for malaise/fatigue. Negative for fever and chills.  Eyes: Negative for blurred vision.  Respiratory: Negative for cough and shortness of breath.   Cardiovascular: Negative for chest pain.  Gastrointestinal: Negative for nausea, vomiting, abdominal pain, diarrhea and constipation.  Genitourinary: Negative for dysuria.  Musculoskeletal: Negative for joint pain.  Neurological: Positive for weakness. Negative for dizziness and headaches.   Exam: Physical Exam  HENT:  Nose: No mucosal edema.  Mouth/Throat: No oropharyngeal exudate or posterior oropharyngeal edema.  Eyes: Conjunctivae, EOM and lids are normal. Pupils are equal, round, and reactive to light.  Neck: No JVD present. Carotid bruit is not present. No edema present. No thyroid mass and no thyromegaly present.  Cardiovascular: S1 normal and S2 normal.  Exam reveals no gallop.   No murmur heard. Pulses:      Dorsalis pedis pulses are 2+ on the right side, and 2+ on the left side.  Respiratory: No respiratory distress. He has no wheezes. He has no rhonchi. He has no rales.  GI: Soft. Bowel sounds are normal. There is no tenderness.  Musculoskeletal:       Right ankle: He exhibits no swelling.       Left ankle: He exhibits no swelling.  Lymphadenopathy:    He has no cervical adenopathy.  Neurological: He is alert.  Skin: Skin is warm. No  rash noted. Nails show no clubbing.  Psychiatric: He has a normal mood and affect.      Data Reviewed: Basic Metabolic Panel:  Recent Labs Lab 04/13/15 1833 04/14/15 0346 04/15/15 1614 04/20/15 1211  NA 135  --   --  136  K 3.5  --   --  4.3  CL 102  --   --  96*  CO2 26  --   --  30  GLUCOSE 188*  --   --  178*  BUN 10  --   --  46*  CREATININE 2.04* 2.97*  --  5.66*  CALCIUM 9.2  --   --  10.1  PHOS  --   --  3.8 4.0   CBC:  Recent Labs Lab 04/13/15 1833 04/14/15 0346 04/18/15 0616  WBC 11.5* 9.3 9.5  NEUTROABS 9.9*  --   --   HGB 10.1* 9.8* 9.9*  HCT 30.6* 29.0* 29.2*  MCV 91.4 89.7 89.5  PLT 234 227 194   Cardiac Enzymes:  Recent Labs Lab 04/13/15 1833  TROPONINI <0.03   BNP (last 3 results)  Recent Labs  01/02/15 1924  BNP 1454.0*   CBG:  Recent Labs Lab 04/19/15 1326 04/19/15 1621 04/19/15 2111 04/20/15 0737 04/20/15 1618  GLUCAP 267* 246* 111* 110* 76    Studies: No results found.  Scheduled Meds: . amLODipine  5 mg Oral Daily  . aspirin  81 mg Oral Daily  . calcium  acetate  1,334 mg Oral TID WC  . cloNIDine  0.1 mg Oral BID  . doxazosin  8 mg Oral QHS  . heparin  5,000 Units Subcutaneous 3 times per day  . hydrALAZINE  100 mg Oral TID  . insulin aspart  0-9 Units Subcutaneous TID WC  . insulin aspart  2 Units Subcutaneous TID AC  . insulin detemir  15 Units Subcutaneous Daily  . metoprolol  50 mg Oral BID  . simvastatin  40 mg Oral QHS  . sodium bicarbonate  650 mg Oral BID    Assessment/Plan:  1. Acute hemorrhagic strokes, subacute stroke: Continue aspirin and simvastatin. OT recommends STR/SNF, await PT reeval. Off seroquel. Some concern for A.fib on initial EKG (lots of artifacts) but repeat EKG today shows NSR 2. End-stage renal disease continue dialysis as per nephrology. HD per nephro 3. Essential hypertension-patient on numerous blood pressure medications. Rather her blood pressure a little high than too low. May  be due to lack of HD it's running high, it may drop post HD 4. Type 2 diabetes continue Levemir insulin and sliding scale 5. Parkinson's disease on Sinemet 6. Metabolic encephalopathy: likely multifactorial. Check ammonia.  7. Weakness- physical therapy REevaluation  Code Status: Full Code Family Communication: Spoke with the wife Jason Aguilar on phone [phone]) Disposition Plan: STR/SNF vs home   Consultants:  Neurology  Time spent: 25 minutes  [facility], [facility]

## 2015-04-20 NOTE — Progress Notes (Signed)
Pre-hd tx 

## 2015-04-20 NOTE — Progress Notes (Signed)
Hemodialysis completed. 

## 2015-04-20 NOTE — Progress Notes (Signed)
[facility]  ROUNDING NOTE   Subjective:  Patient seen at bedside. He is due for hemodialysis today. Resting comfortably at the moment.    Objective:  Vital signs in last 24 hours:  Temp:  [97.7 F (36.5 C)-98.7 F (37.1 C)] 98.7 F (37.1 C) (03/23 0621) Pulse Rate:  [50-68] 50 (03/23 0621) Resp:  [18] 18 (03/23 0621) BP: (97-156)/(25-58) 138/48 mmHg (03/23 0712) SpO2:  [92 %-97 %] 94 % (03/23 0621)  Weight change:  Filed Weights   04/18/15 1310 04/18/15 1700 04/18/15 1718  Weight: 58.4 kg (128 lb 12 oz) 57.8 kg (127 lb 6.8 oz) 59.784 kg (131 lb 12.8 oz)    Intake/Output: I/O last 3 completed shifts: In: 1010 [P.O.:1010] Out: 450 [Urine:450]   Intake/Output this shift:     Physical Exam: General: NAD, resting in bed  Head: Moist oral mucosal membranes  Eyes: Anicteric  Neck: Supple, trachea midline  Lungs:  Clear to auscultation, normal effort  Heart: Regular rate and rhythm no rubs  Abdomen:  Soft, nontender, BS present  Extremities: No peripheral edema.  Neurologic: Awake, alert, following simple commands  Skin: No lesions  Access: R IJ permcath    Basic Metabolic Panel:  Recent Labs Lab 04/13/15 1833 04/14/15 0346 04/15/15 1614  NA 135  --   --   K 3.5  --   --   CL 102  --   --   CO2 26  --   --   GLUCOSE 188*  --   --   BUN 10  --   --   CREATININE 2.04* 2.97*  --   CALCIUM 9.2  --   --   PHOS  --   --  3.8    Liver Function Tests: No results for input(s): AST, ALT, ALKPHOS, BILITOT, PROT, ALBUMIN in the last 168 hours. No results for input(s): LIPASE, AMYLASE in the last 168 hours. No results for input(s): AMMONIA in the last 168 hours.  CBC:  Recent Labs Lab 04/13/15 1833 04/14/15 0346 04/18/15 0616  WBC 11.5* 9.3 9.5  NEUTROABS 9.9*  --   --   HGB 10.1* 9.8* 9.9*  HCT 30.6* 29.0* 29.2*  MCV 91.4 89.7 89.5  PLT 234 227 194    Cardiac Enzymes:  Recent Labs Lab 04/13/15 1833  TROPONINI <0.03     BNP: Invalid input(s): POCBNP  CBG:  Recent Labs Lab 04/19/15 1128 04/19/15 1326 04/19/15 1621 04/19/15 2111 04/20/15 0737  GLUCAP 201* 267* 246* 111* 110*    Microbiology: Results for orders placed or performed during the hospital encounter of 03/03/15  Surgical pcr screen     Status: Abnormal   Collection Time: 03/03/15  2:39 PM  Result Value Ref Range Status   MRSA, PCR NEGATIVE NEGATIVE Final   Staphylococcus aureus POSITIVE (A) NEGATIVE Final    Coagulation Studies: No results for input(s): LABPROT, INR in the last 72 hours.  Urinalysis: No results for input(s): COLORURINE, LABSPEC, PHURINE, GLUCOSEU, HGBUR, BILIRUBINUR, KETONESUR, PROTEINUR, UROBILINOGEN, NITRITE, LEUKOCYTESUR in the last 72 hours.  Invalid input(s): APPERANCEUR    Imaging: No results found.   Medications:     . amLODipine  5 mg Oral Daily  . aspirin  81 mg Oral Daily  . calcium acetate  1,334 mg Oral TID WC  . cloNIDine  0.1 mg Oral BID  . doxazosin  8 mg Oral QHS  . heparin  5,000 Units Subcutaneous 3 times per day  . hydrALAZINE  100 mg  Oral TID  . insulin aspart  0-9 Units Subcutaneous TID WC  . insulin aspart  2 Units Subcutaneous TID AC  . insulin detemir  15 Units Subcutaneous Daily  . metoprolol  50 mg Oral BID  . simvastatin  40 mg Oral QHS  . sodium bicarbonate  650 mg Oral BID   acetaminophen **OR** acetaminophen, ondansetron **OR** ondansetron (ZOFRAN) IV  Assessment/ Plan:  80 y.o. 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 dialysis center by [facility] nephrology, hypertension, diabetes mellitus type 2, COPD, CVA with hemorrhagic infarct in the putamen [facility] TTS [facility]  1.  ESRD on HD TTHS:  Patient due for dialysis today. We will prepare  orders.  2.  Anemia of CKD: Continue to avoid Epogen given recent CVA.  3.  Secondary Hyperparathyroidism: Continue PhosLo 2 tablets by mouth 3 times a day with meals.  4. Diabetes Mellitus type II with chronic kidney disease: Most recent A1c was quite high at 11.2. Management per hospitalist.  Currently on a combination of short and long-acting insulin.  5. Hypertension: Blood pressure remains under reasonable control at 138/48. -  Continue Amlodipine, clonidine, doxazosin, hydralazine, metoprolol   LOS:  Kemuel Buchmann 3/23/20178:32 AM

## 2015-04-20 NOTE — Progress Notes (Signed)
PT Cancellation Note  Patient Details Name: Jason Aguilar MRN: [id number] DOB: 1935-02-02   Cancelled Treatment:    Reason Eval/Treat Not Completed: Patient at procedure or test/unavailable. Treatment attempted this morning; pt currently at hemodialysis. Re attempt treatment this afternoon as the schedule allows.    Charlaine Dalton, [place] 04/20/2015, 11:06 AM

## 2015-04-21 LAB — GLUCOSE, CAPILLARY
Glucose-Capillary: 218 mg/dL — ABNORMAL HIGH (ref 65–99)
Glucose-Capillary: 148 mg/dL — ABNORMAL HIGH (ref 65–99)
Glucose-Capillary: 183 mg/dL — ABNORMAL HIGH (ref 65–99)

## 2015-04-21 LAB — AMMONIA: Ammonia: 34 umol/L (ref 9–35)

## 2015-04-21 MED ORDER — INSULIN DETEMIR 100 UNIT/ML ~~LOC~~ SOLN: 15.0000 [IU] | Freq: Every day | SUBCUTANEOUS | Status: AC

## 2015-04-21 MED ORDER — INSULIN ASPART 100 UNIT/ML ~~LOC~~ SOLN: 2.0000 [IU] | Freq: Three times a day (TID) | SUBCUTANEOUS | Status: AC

## 2015-04-21 NOTE — Discharge Summary (Addendum)
[employer] [facility] [place] NAME: Jason Aguilar    MR#:  [id number]  DATE OF BIRTH:  [birth year]  DATE OF ADMISSION:  04/13/2015 ADMITTING PHYSICIAN: Fritzi Mandes, MD  DATE OF DISCHARGE: *04/21/2015  PRIMARY CARE PHYSICIAN: Valera Castle, MD    ADMISSION DIAGNOSIS:  Altered mental status, unspecified altered mental status type [R41.82]  DISCHARGE DIAGNOSIS:  Active Problems:   Altered mental status   CVA (cerebral infarction)   SECONDARY DIAGNOSIS:   Past Medical History  Diagnosis Date  . Glaucoma     both eyes Drs. Arrie [place] and [place]  . Hematuria syndrome     s/p negative cystoscopy  . GERD (gastroesophageal reflux disease) 2003    EGD  . Allergy   . Hyperlipidemia   . Obstructive sleep apnea of adult     CPAP at 8 cm H20  . Noncompliance with medication regimen   . 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 by [facility]: low grade noninvasive  . Chronic kidney disease (CKD), stage III (moderate)     with proteinuria  . Glaucoma   . Hypertension   . Diabetes mellitus     Type 2  . Cataract due to secondary diabetes mellitus ([place])   . COPD (chronic obstructive pulmonary disease) ([place])     secondary to tobacco abuse  . History of tobacco abuse     60 pack year history; quit 1980    HOSPITAL COURSE:   80 year old male with recent hospitalization for acute stroke and resulting Orvan Falconer who presented for lethargy and found to have evidence of hemorrhagic transformation of recent putaminal infarct and a new subacute left thalamus infarct.  1. CVA with hemorrhagic transformation/new subacute left thalamus infarct: As per recommendations by neurology continue aspirin and Plavix and statin. It was no evidence of atrial fibrillation on telemetry monitoring. MRA shows severe stenosis right cavernous carotid and moderate stenosis left cavernous carotid. Bilateral MCA disease.  2. ESRD on hemodialysis: Continue  dialysis Tuesday, Thursday and Saturday. Continue sodium bicarbonate. 3. Type 2 diabetes: Continue ADA diet, insulin area  4. Metabolic encephalopathy: Patient has been sleepy on/off but walked today with CNA. Check NH3. Stopped all sedative meds on admission  5. Essential hypertension: Continue Norvasc, Cardura, metoprolol and clonidine.    DISCHARGE CONDITIONS AND DIET:  Stable for discharge on a heart healthy/diabetic/renal diet  CONSULTS OBTAINED:  Treatment Team:  Anthonette Legato, MD Catarina Hartshorn, MD Alexis Goodell, MD  DRUG ALLERGIES:   Allergies  Allergen Reactions  . Losartan Swelling  . Dristan Rash    DISCHARGE MEDICATIONS:   Current Discharge Medication List    START taking these medications   Details  insulin aspart (NOVOLOG) 100 UNIT/ML injection Inject 2 Units into the skin 3 (three) times daily before meals. Qty: 10 mL, Refills: 11      CONTINUE these medications which have CHANGED   Details  insulin detemir (LEVEMIR) 100 UNIT/ML injection Inject 0.15 mLs (15 Units total) into the skin daily. Qty: 10 mL, Refills: 11      CONTINUE these medications which have NOT CHANGED   Details  amLODipine (NORVASC) 5 MG tablet Take 5 mg by mouth daily.    aspirin 81 MG chewable tablet Chew 1 tablet (81 mg total) by mouth daily. Qty: 30 tablet, Refills: 0    calcium acetate (PHOSLO) 667 MG capsule Take 2 capsules (1,334 mg total) by mouth 3 (three) times daily with meals. Qty: 180  capsule, Refills: 2    cloNIDine (CATAPRES) 0.1 MG tablet Take 1 tablet (0.1 mg total) by mouth 2 (two) times daily. Qty: 60 tablet, Refills: 2    clopidogrel (PLAVIX) 75 MG tablet Take 1 tablet (75 mg total) by mouth daily. Qty: 30 tablet, Refills: 0    doxazosin (CARDURA) 8 MG tablet Take 8 mg by mouth at bedtime.     hydrALAZINE (APRESOLINE) 100 MG tablet Take 100 mg by mouth 3 (three) times daily.    metoprolol (LOPRESSOR) 50 MG tablet Take 50 mg by mouth 2 (two)  times daily.      simvastatin (ZOCOR) 40 MG tablet Take 40 mg by mouth at bedtime.    sodium bicarbonate 650 MG tablet Take 650 mg by mouth 2 (two) times daily.      STOP taking these medications     ALPRAZolam (XANAX) 0.25 MG tablet      carbidopa-levodopa (SINEMET IR) 25-100 MG tablet      insulin regular (NOVOLIN R) 100 units/mL injection      QUEtiapine (SEROQUEL) 25 MG tablet               Today   CHIEF COMPLAINT:  Tired o other acute issues   VITAL SIGNS:  Blood pressure 169/59, pulse 63, temperature 97.3 F (36.3 C), temperature source Oral, resp. rate 18, height 5\' 5"  (1.651 m), weight 59.7 kg (131 lb 9.8 oz), SpO2 99 %.   REVIEW OF SYSTEMS:  Review of Systems  Constitutional: Negative for fever, chills and malaise/fatigue.       Sleepy  HENT: Negative for ear discharge, ear pain, hearing loss, nosebleeds and sore throat.   Eyes: Negative for blurred vision and pain.  Respiratory: Negative for cough, hemoptysis, shortness of breath and wheezing.   Cardiovascular: Negative for chest pain, palpitations and leg swelling.  Gastrointestinal: Negative for nausea, vomiting, abdominal pain, diarrhea and blood in stool.  Genitourinary: Negative for dysuria.  Musculoskeletal: Negative for back pain.  Neurological: Negative for dizziness, tremors, speech change, focal weakness, seizures and headaches.  Endo/Heme/Allergies: Does not bruise/bleed easily.  Psychiatric/Behavioral: Negative for depression, suicidal ideas and hallucinations.     PHYSICAL EXAMINATION:  GENERAL:  80 y.o.-year-old patient lying in the bed with no acute distress.  NECK:  Supple, no jugular venous distention. No thyroid enlargement, no tenderness.  LUNGS: Normal breath sounds bilaterally, no wheezing, rales,rhonchi  No use of accessory muscles of respiration.  CARDIOVASCULAR: S1, S2 normal. No murmurs, rubs, or gallops.  ABDOMEN: Soft, non-tender, non-distended. Bowel sounds present. No  organomegaly or mass.  EXTREMITIES: No pedal edema, cyanosis, or clubbing.  PSYCHIATRIC: The patient is alert, sleepy at timesand oriented x 3.  SKIN: No obvious rash, lesion, or ulcer.   DATA REVIEW:   CBC  Recent Labs Lab 04/18/15 0616  WBC 9.5  HGB 9.9*  HCT 29.2*  PLT 194    Chemistries   Recent Labs Lab 04/20/15 1211  NA 136  K 4.3  CL 96*  CO2 30  GLUCOSE 178*  BUN 46*  CREATININE 5.66*  CALCIUM 10.1    Cardiac Enzymes No results for input(s): TROPONINI in the last 168 hours.  Microbiology Results  @[email or web address] @  RADIOLOGY:  No results found.    Management plans discussed with the patient and he is in agreement. Stable for discharge   Patient should follow up with PCP in 1 week  CODE STATUS:     Code Status Orders  Start     Ordered   04/14/15 0118  Full code   Continuous     04/14/15 0117    Code Status History    Date Active Date Inactive Code Status Order ID Comments User Context   03/29/2015 12:42 AM 04/05/2015  8:06 PM Full Code [id number]  Lance Coon, MD Inpatient   01/02/2015  9:42 PM 01/06/2015  6:51 PM Full Code [id number]  Lytle Butte, MD ED      TOTAL TIME TAKING CARE OF THIS PATIENT: 35 minutes.    Note: This dictation was prepared with Dragon dictation along with smaller phrase technology. Any transcriptional errors that result from this process are unintentional.  Ephraim Reichel M.D on 04/21/2015 at 11:35 AM  Between 7am to 6pm - Pager - [phone] After 6pm go to [email or web address] - password [email or web address] [place] [facility]  Office  [phone]  CC: Primary care physician; Valera Castle, MD

## 2015-04-21 NOTE — Progress Notes (Signed)
Spoke with family. Wife not upset, she was just anxious about her husband. SHe feels comfortable  With plan for discharge to [facility].

## 2015-04-21 NOTE — Clinical Social Work Note (Signed)
Patient to discharge today. After much discussion with patient's wife and daughter who stated that they felt overwhelmed and rushed and after coordinating the MD to communicate with the patient's wife and daughter, the family has agreed for patient to go to STR today and they have chosen [facility]. Discharge information sent to [facility]. Nurse to call report.  Shela Leff MSW,LCSW [phone]

## 2015-04-21 NOTE — Clinical Social Work Placement (Signed)
   CLINICAL SOCIAL WORK PLACEMENT  NOTE  Date:  04/21/2015  Patient Details  Name: Jason Aguilar MRN: [id number] Date of Birth: [birth year]  Clinical Social Work is seeking post-discharge placement for this patient at the [place] level of care (*CSW will initial, date and re-position this form in  chart as items are completed):  Yes   Patient/family provided with [place] Work Department's list of facilities offering this level of care within the geographic area requested by the patient (or if unable, by the patient's family).  Yes   Patient/family informed of their freedom to choose among providers that offer the needed level of care, that participate in Medicare, Medicaid or managed care program needed by the patient, have an available bed and are willing to accept the patient.  Yes   Patient/family informed of [facility]'s ownership interest in [facility] and [facility], as well as of the fact that they are under no obligation to receive care at these facilities.  PASRR submitted to EDS on 04/17/15     PASRR number received on 04/17/15     Existing PASRR number confirmed on       FL2 transmitted to all facilities in geographic area requested by pt/family on 04/17/15     FL2 transmitted to all facilities within larger geographic area on       Patient informed that his/her managed care company has contracts with or will negotiate with certain facilities, including the following:        Yes   Patient/family informed of bed offers received.  Patient chooses bed at  [facility])     Physician recommends and patient chooses bed at  [facility])    Patient to be transferred to  ([facility]) on 04/21/15.  Patient to be transferred to facility by  (EMS)     Patient family notified on 04/21/15 of transfer.  Name of family member notified:   (wife and daughter)     PHYSICIAN       Additional Comment:     _______________________________________________ Shela Leff, LCSW 04/21/2015, 2:15 PM

## 2015-04-21 NOTE — Progress Notes (Signed)
Patient picked up by EMS and taken to [facility]. Patient is alert and oriented, No acute distress noted.

## 2015-04-21 NOTE — Progress Notes (Signed)
Patient discharged to [facility] report called to [facility] LPN, Family at the bedside and is aware of patient discharge. Patient is alert and oriented times 3, VSS, No acute distress noted. Patient ambulates with front wheel walker. NO acute distress noted

## 2015-04-21 NOTE — Progress Notes (Signed)
[facility]  ROUNDING NOTE   Subjective:  Patient had hemodialysis yesterday. He tolerated this well. Ultrafiltration achieved was 1.5 kg.    Objective:  Vital signs in last 24 hours:  Temp:  [97.3 F (36.3 C)-98.6 F (37 C)] 97.3 F (36.3 C) (03/24 1030) Pulse Rate:  [59-77] 63 (03/24 1030) Resp:  [18-20] 18 (03/24 1030) BP: (147-170)/(45-59) 169/59 mmHg (03/24 1030) SpO2:  [93 %-99 %] 99 % (03/24 1030)  Weight change:  Filed Weights   04/18/15 1718 04/20/15 1115 04/20/15 1454  Weight: 59.784 kg (131 lb 12.8 oz) 61 kg (134 lb 7.7 oz) 59.7 kg (131 lb 9.8 oz)    Intake/Output: I/O last 3 completed shifts: In: 720 [P.O.:720] Out: [id number] [Urine:225; Other:1500]   Intake/Output this shift:  Total I/O In: 340 [P.O.:340] Out: -   Physical Exam: General: NAD, resting in bed  Head: Moist oral mucosal membranes  Eyes: Anicteric  Neck: Supple, trachea midline  Lungs:  Clear to auscultation, normal effort  Heart: Regular rate and rhythm no rubs  Abdomen:  Soft, nontender, BS present  Extremities: No peripheral edema.  Neurologic: Awake, alert, following simple commands  Skin: No lesions  Access: R IJ permcath    Basic Metabolic Panel:  Recent Labs Lab 04/15/15 1614 04/20/15 1211  NA  --  136  K  --  4.3  CL  --  96*  CO2  --  30  GLUCOSE  --  178*  BUN  --  46*  CREATININE  --  5.66*  CALCIUM  --  10.1  PHOS 3.8 4.0    Liver Function Tests:  Recent Labs Lab 04/20/15 1211  ALBUMIN 3.0*   No results for input(s): LIPASE, AMYLASE in the last 168 hours.  Recent Labs Lab 04/21/15 1233  AMMONIA 34    CBC:  Recent Labs Lab 04/18/15 0616  WBC 9.5  HGB 9.9*  HCT 29.2*  MCV 89.5  PLT 194    Cardiac Enzymes: No results for input(s): CKTOTAL, CKMB, CKMBINDEX, TROPONINI in the last 168 hours.  BNP: Invalid input(s): POCBNP  CBG:  Recent Labs Lab 04/20/15 0737 04/20/15 1618 04/20/15 2240 04/21/15 0742 04/21/15 1148   GLUCAP 110* 76 218* 148* 183*    Microbiology: Results for orders placed or performed during the hospital encounter of 03/03/15  Surgical pcr screen     Status: Abnormal   Collection Time: 03/03/15  2:39 PM  Result Value Ref Range Status   MRSA, PCR NEGATIVE NEGATIVE Final   Staphylococcus aureus POSITIVE (A) NEGATIVE Final    Coagulation Studies: No results for input(s): LABPROT, INR in the last 72 hours.  Urinalysis: No results for input(s): COLORURINE, LABSPEC, PHURINE, GLUCOSEU, HGBUR, BILIRUBINUR, KETONESUR, PROTEINUR, UROBILINOGEN, NITRITE, LEUKOCYTESUR in the last 72 hours.  Invalid input(s): APPERANCEUR    Imaging: No results found.   Medications:     . amLODipine  5 mg Oral Daily  . aspirin  81 mg Oral Daily  . calcium acetate  1,334 mg Oral TID WC  . cloNIDine  0.1 mg Oral BID  . doxazosin  8 mg Oral QHS  . heparin  5,000 Units Subcutaneous 3 times per day  . hydrALAZINE  100 mg Oral TID  . insulin aspart  0-9 Units Subcutaneous TID WC  . insulin aspart  2 Units Subcutaneous TID AC  . insulin detemir  15 Units Subcutaneous Daily  . metoprolol  50 mg Oral BID  . simvastatin  40 mg Oral QHS  .  sodium bicarbonate  650 mg Oral BID   acetaminophen **OR** acetaminophen, ondansetron **OR** ondansetron (ZOFRAN) IV  Assessment/ Plan:  80 y.o. 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  ESRD on HD TTHS:   No acute indication for dialysis today.  We will plan for dialysis again tomorrow if still here.  2.  Anemia of CKD: Continue to avoid Epogen given recent CVA.  3.  Secondary Hyperparathyroidism:  most recent phosphorus was 4.0 and is acceptable.  Continue PhosLo 2 tablets by mouth 3 times a day with meals.  4. Diabetes Mellitus type II with chronic kidney disease: Most recent A1c was quite high at 11.2. Management per hospitalist.  Currently on a combination of short and long-acting insulin.  5. Hypertension: blood pressure slightly higher today at 169/59 however overall under fairly good control.. -  Continue Amlodipine, clonidine, doxazosin, hydralazine, metoprolol   LOS:  Viann Nielson 3/24/20173:11 PM

## 2015-05-18 ENCOUNTER — Inpatient Hospital Stay: Payer: Medicare Other

## 2015-05-18 ENCOUNTER — Emergency Department: Payer: Medicare Other

## 2015-05-18 ENCOUNTER — Inpatient Hospital Stay
Admission: EM | Admit: 2015-05-18 | Discharge: 2015-05-29 | DRG: 870 | Disposition: E | Payer: Medicare Other | Attending: Internal Medicine | Admitting: Internal Medicine

## 2015-05-18 ENCOUNTER — Encounter: Payer: Self-pay | Admitting: Emergency Medicine

## 2015-05-18 DIAGNOSIS — R6521 Severe sepsis with septic shock: Secondary | ICD-10-CM | POA: Diagnosis present

## 2015-05-18 DIAGNOSIS — I469 Cardiac arrest, cause unspecified: Secondary | ICD-10-CM | POA: Diagnosis present

## 2015-05-18 DIAGNOSIS — N492 Inflammatory disorders of scrotum: Secondary | ICD-10-CM | POA: Diagnosis not present

## 2015-05-18 DIAGNOSIS — A480 Gas gangrene: Secondary | ICD-10-CM | POA: Diagnosis present

## 2015-05-18 DIAGNOSIS — J690 Pneumonitis due to inhalation of food and vomit: Secondary | ICD-10-CM | POA: Diagnosis not present

## 2015-05-18 DIAGNOSIS — G4733 Obstructive sleep apnea (adult) (pediatric): Secondary | ICD-10-CM | POA: Diagnosis present

## 2015-05-18 DIAGNOSIS — I248 Other forms of acute ischemic heart disease: Secondary | ICD-10-CM | POA: Diagnosis present

## 2015-05-18 DIAGNOSIS — A419 Sepsis, unspecified organism: Principal | ICD-10-CM | POA: Insufficient documentation

## 2015-05-18 DIAGNOSIS — I120 Hypertensive chronic kidney disease with stage 5 chronic kidney disease or end stage renal disease: Secondary | ICD-10-CM | POA: Diagnosis present

## 2015-05-18 DIAGNOSIS — N5089 Other specified disorders of the male genital organs: Secondary | ICD-10-CM | POA: Diagnosis present

## 2015-05-18 DIAGNOSIS — Z79899 Other long term (current) drug therapy: Secondary | ICD-10-CM | POA: Diagnosis not present

## 2015-05-18 DIAGNOSIS — Z87891 Personal history of nicotine dependence: Secondary | ICD-10-CM

## 2015-05-18 DIAGNOSIS — N2581 Secondary hyperparathyroidism of renal origin: Secondary | ICD-10-CM | POA: Diagnosis present

## 2015-05-18 DIAGNOSIS — K219 Gastro-esophageal reflux disease without esophagitis: Secondary | ICD-10-CM | POA: Diagnosis present

## 2015-05-18 DIAGNOSIS — Z9079 Acquired absence of other genital organ(s): Secondary | ICD-10-CM | POA: Diagnosis not present

## 2015-05-18 DIAGNOSIS — J9600 Acute respiratory failure, unspecified whether with hypoxia or hypercapnia: Secondary | ICD-10-CM

## 2015-05-18 DIAGNOSIS — Z833 Family history of diabetes mellitus: Secondary | ICD-10-CM | POA: Diagnosis not present

## 2015-05-18 DIAGNOSIS — N390 Urinary tract infection, site not specified: Secondary | ICD-10-CM | POA: Diagnosis not present

## 2015-05-18 DIAGNOSIS — Z8673 Personal history of transient ischemic attack (TIA), and cerebral infarction without residual deficits: Secondary | ICD-10-CM

## 2015-05-18 DIAGNOSIS — G931 Anoxic brain damage, not elsewhere classified: Secondary | ICD-10-CM | POA: Diagnosis present

## 2015-05-18 DIAGNOSIS — Z794 Long term (current) use of insulin: Secondary | ICD-10-CM | POA: Diagnosis not present

## 2015-05-18 DIAGNOSIS — R400 Somnolence: Secondary | ICD-10-CM | POA: Diagnosis not present

## 2015-05-18 DIAGNOSIS — R404 Transient alteration of awareness: Secondary | ICD-10-CM

## 2015-05-18 DIAGNOSIS — E876 Hypokalemia: Secondary | ICD-10-CM | POA: Diagnosis not present

## 2015-05-18 DIAGNOSIS — E785 Hyperlipidemia, unspecified: Secondary | ICD-10-CM | POA: Diagnosis present

## 2015-05-18 DIAGNOSIS — Z8551 Personal history of malignant neoplasm of bladder: Secondary | ICD-10-CM

## 2015-05-18 DIAGNOSIS — R4182 Altered mental status, unspecified: Secondary | ICD-10-CM | POA: Diagnosis present

## 2015-05-18 DIAGNOSIS — M726 Necrotizing fasciitis: Secondary | ICD-10-CM | POA: Diagnosis present

## 2015-05-18 DIAGNOSIS — Z66 Do not resuscitate: Secondary | ICD-10-CM | POA: Diagnosis present

## 2015-05-18 DIAGNOSIS — J9601 Acute respiratory failure with hypoxia: Secondary | ICD-10-CM

## 2015-05-18 DIAGNOSIS — Z992 Dependence on renal dialysis: Secondary | ICD-10-CM | POA: Diagnosis not present

## 2015-05-18 DIAGNOSIS — J449 Chronic obstructive pulmonary disease, unspecified: Secondary | ICD-10-CM | POA: Diagnosis present

## 2015-05-18 DIAGNOSIS — N186 End stage renal disease: Secondary | ICD-10-CM | POA: Diagnosis present

## 2015-05-18 DIAGNOSIS — H409 Unspecified glaucoma: Secondary | ICD-10-CM | POA: Diagnosis present

## 2015-05-18 DIAGNOSIS — Z9911 Dependence on respirator [ventilator] status: Secondary | ICD-10-CM | POA: Diagnosis not present

## 2015-05-18 DIAGNOSIS — Z515 Encounter for palliative care: Secondary | ICD-10-CM | POA: Diagnosis present

## 2015-05-18 DIAGNOSIS — E1122 Type 2 diabetes mellitus with diabetic chronic kidney disease: Secondary | ICD-10-CM | POA: Diagnosis present

## 2015-05-18 DIAGNOSIS — Z452 Encounter for adjustment and management of vascular access device: Secondary | ICD-10-CM

## 2015-05-18 DIAGNOSIS — Z7982 Long term (current) use of aspirin: Secondary | ICD-10-CM | POA: Diagnosis not present

## 2015-05-18 DIAGNOSIS — N493 Fournier gangrene: Secondary | ICD-10-CM | POA: Diagnosis present

## 2015-05-18 DIAGNOSIS — Z8249 Family history of ischemic heart disease and other diseases of the circulatory system: Secondary | ICD-10-CM

## 2015-05-18 DIAGNOSIS — R4189 Other symptoms and signs involving cognitive functions and awareness: Secondary | ICD-10-CM

## 2015-05-18 DIAGNOSIS — R001 Bradycardia, unspecified: Secondary | ICD-10-CM | POA: Diagnosis present

## 2015-05-18 DIAGNOSIS — D631 Anemia in chronic kidney disease: Secondary | ICD-10-CM | POA: Diagnosis present

## 2015-05-18 DIAGNOSIS — Z9889 Other specified postprocedural states: Secondary | ICD-10-CM | POA: Diagnosis not present

## 2015-05-18 LAB — URINALYSIS COMPLETE WITH MICROSCOPIC (ARMC ONLY)
BILIRUBIN URINE: NEGATIVE
GLUCOSE, UA: NEGATIVE mg/dL
KETONES UR: NEGATIVE mg/dL
NITRITE: NEGATIVE
PH: 5 (ref 5.0–8.0)
Protein, ur: 100 mg/dL — AB
Specific Gravity, Urine: 1.014 (ref 1.005–1.030)
Squamous Epithelial / LPF: NONE SEEN

## 2015-05-18 LAB — BASIC METABOLIC PANEL
Anion gap: 8 (ref 5–15)
BUN: 24 mg/dL — AB (ref 6–20)
CHLORIDE: 102 mmol/L (ref 101–111)
CO2: 27 mmol/L (ref 22–32)
CREATININE: 1.76 mg/dL — AB (ref 0.61–1.24)
Calcium: 9.6 mg/dL (ref 8.9–10.3)
GFR calc Af Amer: 41 mL/min — ABNORMAL LOW (ref >60)
GFR, EST NON AFRICAN AMERICAN: 36 mL/min — AB (ref >60)
GLUCOSE: 190 mg/dL — AB (ref 65–99)
Potassium: 3.2 mmol/L — ABNORMAL LOW (ref 3.5–5.1)
SODIUM: 137 mmol/L (ref 135–145)

## 2015-05-18 LAB — CBC
HCT: 29.2 % — ABNORMAL LOW (ref 40.0–52.0)
Hemoglobin: 9.5 g/dL — ABNORMAL LOW (ref 13.0–18.0)
MCH: 29.2 pg (ref 26.0–34.0)
MCHC: 32.6 g/dL (ref 32.0–36.0)
MCV: 89.6 fL (ref 80.0–100.0)
PLATELETS: 329 10*3/uL (ref 150–440)
RBC: 3.26 MIL/uL — ABNORMAL LOW (ref 4.40–5.90)
RDW: 14.2 % (ref 11.5–14.5)
WBC: 35.1 10*3/uL — ABNORMAL HIGH (ref 3.8–10.6)

## 2015-05-18 LAB — DIFFERENTIAL
BASOS PCT: 0 %
Basophils Absolute: 0 10*3/uL (ref 0–0.1)
EOS PCT: 0 %
Eosinophils Absolute: 0 10*3/uL (ref 0–0.7)
LYMPHS PCT: 2 %
Lymphs Abs: 0.5 10*3/uL — ABNORMAL LOW (ref 1.0–3.6)
MONO ABS: 1.5 10*3/uL — AB (ref 0.2–1.0)
Monocytes Relative: 4 %
Neutro Abs: 33 10*3/uL — ABNORMAL HIGH (ref 1.4–6.5)
Neutrophils Relative %: 94 %

## 2015-05-18 LAB — BLOOD GAS, ARTERIAL
Acid-Base Excess: 4.8 mmol/L — ABNORMAL HIGH (ref 0.0–3.0)
Bicarbonate: 27.4 mEq/L (ref 21.0–28.0)
FIO2: 0.4
MECHANICAL RATE: 18
O2 Saturation: 99 %
PATIENT TEMPERATURE: 37
PCO2 ART: 32 mmHg (ref 32.0–48.0)
PEEP: 5 cmH2O
RATE: 18 resp/min
VT: 500 mL
pH, Arterial: 7.54 — ABNORMAL HIGH (ref 7.350–7.450)
pO2, Arterial: 116 mmHg — ABNORMAL HIGH (ref 83.0–108.0)

## 2015-05-18 LAB — COMPREHENSIVE METABOLIC PANEL
ALK PHOS: 132 U/L — AB (ref 38–126)
ALT: 27 U/L (ref 17–63)
ANION GAP: 14 (ref 5–15)
AST: 50 U/L — ABNORMAL HIGH (ref 15–41)
Albumin: 2.4 g/dL — ABNORMAL LOW (ref 3.5–5.0)
BUN: 16 mg/dL (ref 6–20)
CALCIUM: 10 mg/dL (ref 8.9–10.3)
CO2: 26 mmol/L (ref 22–32)
Chloride: 97 mmol/L — ABNORMAL LOW (ref 101–111)
Creatinine, Ser: 1.68 mg/dL — ABNORMAL HIGH (ref 0.61–1.24)
GFR, EST AFRICAN AMERICAN: 44 mL/min — AB (ref >60)
GFR, EST NON AFRICAN AMERICAN: 38 mL/min — AB (ref >60)
Glucose, Bld: 144 mg/dL — ABNORMAL HIGH (ref 65–99)
Potassium: 3.2 mmol/L — ABNORMAL LOW (ref 3.5–5.1)
SODIUM: 137 mmol/L (ref 135–145)
Total Bilirubin: 0.5 mg/dL (ref 0.3–1.2)
Total Protein: 6.3 g/dL — ABNORMAL LOW (ref 6.5–8.1)

## 2015-05-18 LAB — PROTIME-INR
INR: 0.96
PROTHROMBIN TIME: 13 s (ref 11.4–15.0)

## 2015-05-18 LAB — CKMB (ARMC ONLY): CK, MB: 10 ng/mL — AB (ref 0.5–5.0)

## 2015-05-18 LAB — APTT: aPTT: 31 seconds (ref 24–36)

## 2015-05-18 LAB — LACTIC ACID, PLASMA
LACTIC ACID, VENOUS: 3.5 mmol/L — AB (ref 0.5–2.0)
Lactic Acid, Venous: 1.7 mmol/L (ref 0.5–2.0)

## 2015-05-18 LAB — TROPONIN I
TROPONIN I: 0.25 ng/mL — AB (ref <0.031)
TROPONIN I: 0.07 ng/mL — AB (ref <0.031)

## 2015-05-18 LAB — PROCALCITONIN: Procalcitonin: 9.01 ng/mL

## 2015-05-18 MED ORDER — PIPERACILLIN-TAZOBACTAM 3.375 G IVPB
3.3750 g | Freq: Three times a day (TID) | INTRAVENOUS | Status: DC
  Administered 2015-05-19 (×2): 3.375 g via INTRAVENOUS
  Filled 2015-05-18 (×4): qty 50

## 2015-05-18 MED ORDER — NALOXONE HCL 2 MG/2ML IJ SOSY
0.4000 mg | PREFILLED_SYRINGE | Freq: Once | INTRAMUSCULAR | Status: AC
  Administered 2015-05-18: 0.2 mg via INTRAVENOUS

## 2015-05-18 MED ORDER — PIPERACILLIN-TAZOBACTAM 3.375 G IVPB: 3.3750 g | Freq: Four times a day (QID) | INTRAVENOUS | Status: DC

## 2015-05-18 MED ORDER — FENTANYL 2500MCG IN NS 250ML (10MCG/ML) PREMIX INFUSION
25.0000 ug/h | INTRAVENOUS | Status: DC
  Administered 2015-05-18: 25 ug/h via INTRAVENOUS

## 2015-05-18 MED ORDER — HYDRALAZINE HCL 20 MG/ML IJ SOLN: 10.0000 mg | INTRAMUSCULAR | Status: DC | PRN

## 2015-05-18 MED ORDER — NALOXONE HCL 2 MG/2ML IJ SOSY
PREFILLED_SYRINGE | INTRAMUSCULAR | Status: AC
  Administered 2015-05-18: 0.2 mg via INTRAVENOUS
  Filled 2015-05-18: qty 2

## 2015-05-18 MED ORDER — POTASSIUM CHLORIDE CRYS ER 20 MEQ PO TBCR
40.0000 meq | EXTENDED_RELEASE_TABLET | Freq: Once | ORAL | Status: AC
  Administered 2015-05-19: 40 meq via ORAL
  Filled 2015-05-18: qty 2

## 2015-05-18 MED ORDER — FENTANYL CITRATE (PF) 100 MCG/2ML IJ SOLN
50.0000 ug | Freq: Once | INTRAMUSCULAR | Status: AC
  Administered 2015-05-18: 50 ug via INTRAVENOUS

## 2015-05-18 MED ORDER — FENTANYL BOLUS VIA INFUSION
25.0000 ug | INTRAVENOUS | Status: DC | PRN
  Filled 2015-05-18: qty 25

## 2015-05-18 MED ORDER — INSULIN ASPART 100 UNIT/ML ~~LOC~~ SOLN
2.0000 [IU] | SUBCUTANEOUS | Status: DC
  Administered 2015-05-19: 2 [IU] via SUBCUTANEOUS
  Administered 2015-05-19: 5 [IU] via SUBCUTANEOUS
  Administered 2015-05-19: 4 [IU] via SUBCUTANEOUS
  Administered 2015-05-19: 5 [IU] via SUBCUTANEOUS
  Administered 2015-05-19: 4 [IU] via SUBCUTANEOUS
  Administered 2015-05-19: 2 [IU] via SUBCUTANEOUS
  Administered 2015-05-20: 5 [IU] via SUBCUTANEOUS
  Administered 2015-05-20: 6 [IU] via SUBCUTANEOUS
  Administered 2015-05-20: 4 [IU] via SUBCUTANEOUS
  Administered 2015-05-20: 2 [IU] via SUBCUTANEOUS
  Administered 2015-05-21: 6 [IU] via SUBCUTANEOUS
  Filled 2015-05-18: qty 2
  Filled 2015-05-18: qty 4
  Filled 2015-05-18: qty 2
  Filled 2015-05-18: qty 4
  Filled 2015-05-18: qty 5
  Filled 2015-05-18: qty 4
  Filled 2015-05-18: qty 6
  Filled 2015-05-18: qty 5
  Filled 2015-05-18: qty 2
  Filled 2015-05-18: qty 6
  Filled 2015-05-18: qty 2

## 2015-05-18 MED ORDER — MIDAZOLAM HCL 2 MG/2ML IJ SOLN: 1.0000 mg | INTRAMUSCULAR | Status: DC | PRN

## 2015-05-18 MED ORDER — MIDAZOLAM HCL 2 MG/2ML IJ SOLN
2.0000 mg | Freq: Once | INTRAMUSCULAR | Status: AC
  Administered 2015-05-18: 2 mg via INTRAVENOUS

## 2015-05-18 MED ORDER — PIPERACILLIN-TAZOBACTAM 3.375 G IVPB 30 MIN
3.3750 g | Freq: Once | INTRAVENOUS | Status: AC
  Administered 2015-05-18: 3.375 g via INTRAVENOUS
  Filled 2015-05-18: qty 50

## 2015-05-18 MED ORDER — PANTOPRAZOLE SODIUM 40 MG PO TBEC
40.0000 mg | DELAYED_RELEASE_TABLET | Freq: Every day | ORAL | Status: DC
  Administered 2015-05-19: 40 mg via ORAL
  Filled 2015-05-18: qty 1

## 2015-05-18 MED ORDER — SODIUM CHLORIDE 0.9 % IV SOLN
250.0000 mL | INTRAVENOUS | Status: DC | PRN
  Administered 2015-05-19: 250 mL via INTRAVENOUS

## 2015-05-18 MED ORDER — FENTANYL 2500MCG IN NS 250ML (10MCG/ML) PREMIX INFUSION
INTRAVENOUS | Status: AC
  Administered 2015-05-18: 25 ug/h via INTRAVENOUS
  Filled 2015-05-18: qty 250

## 2015-05-18 MED ORDER — SODIUM CHLORIDE 0.9 % IV SOLN
1.0000 mg/h | INTRAVENOUS | Status: DC
  Filled 2015-05-18: qty 10

## 2015-05-18 MED ORDER — HEPARIN SODIUM (PORCINE) 5000 UNIT/ML IJ SOLN: 5000.0000 [IU] | Freq: Three times a day (TID) | INTRAMUSCULAR | Status: DC

## 2015-05-18 MED ORDER — VANCOMYCIN HCL IN DEXTROSE 1-5 GM/200ML-% IV SOLN
1000.0000 mg | Freq: Once | INTRAVENOUS | Status: AC
  Administered 2015-05-18: 1000 mg via INTRAVENOUS
  Filled 2015-05-18: qty 200

## 2015-05-18 MED ORDER — ROCURONIUM BROMIDE 50 MG/5ML IV SOLN
70.0000 mg | Freq: Once | INTRAVENOUS | Status: AC
  Administered 2015-05-18: 70 mg via INTRAVENOUS

## 2015-05-18 MED ORDER — SODIUM CHLORIDE 0.9 % IV BOLUS (SEPSIS)
1000.0000 mL | INTRAVENOUS | Status: AC
  Administered 2015-05-18 (×2): 1000 mL via INTRAVENOUS

## 2015-05-18 MED ORDER — ETOMIDATE 2 MG/ML IV SOLN: 20.0000 mg | Freq: Once | INTRAVENOUS | Status: DC

## 2015-05-18 MED ORDER — FENTANYL 2500MCG IN NS 250ML (10MCG/ML) PREMIX INFUSION
25.0000 ug/h | INTRAVENOUS | Status: DC
  Administered 2015-05-18: 100 ug/h via INTRAVENOUS
  Administered 2015-05-19: 50 ug/h via INTRAVENOUS
  Administered 2015-05-20: 125 ug/h via INTRAVENOUS
  Filled 2015-05-18: qty 250

## 2015-05-18 MED ORDER — SENNA 8.6 MG PO TABS
1.0000 | ORAL_TABLET | Freq: Every day | ORAL | Status: DC
  Administered 2015-05-19 – 2015-05-22 (×4): 8.6 mg via ORAL
  Filled 2015-05-18 (×4): qty 1

## 2015-05-18 MED ORDER — METOPROLOL TARTRATE 1 MG/ML IV SOLN
5.0000 mg | Freq: Four times a day (QID) | INTRAVENOUS | Status: DC | PRN
Start: 2015-05-18 — End: 2015-05-23

## 2015-05-18 NOTE — ED Notes (Signed)
One of the pt's peripheral IVs infiltrated. [facility] critical care doctor notified. Said that he would notify Dr. Stevenson Clinch but he suggested looking for another peripheral as his best guess was that Dr. Stevenson Clinch wouldn't want a central line at this time. Will look for another peripheral line while waiting to hear from Dr. Stevenson Clinch.

## 2015-05-18 NOTE — ED Provider Notes (Addendum)
[facility] Emergency Department Provider Note  ____________________________________________  Time seen: Seen upon arrival to the emergency department  I have reviewed the triage vital signs and the nursing notes.   HISTORY  Chief Complaint Altered Mental Status    HPI Jason Aguilar is a 80 y.o. male with a history of end-stage renal disease on dialysis who is presenting to the emergency department after being found unresponsive. Per EMS, the patient has been unresponsive all day. However, he was transferred to the emergency department via EMS once he was found to be and especially decreased level of consciousness upon return to his skilled nursing facility after dialysis.  No further history is available at this time. En route, the patient had a nasal trumpet inserted and was placed on a nonrebreather.   Past Medical History  Diagnosis Date  . Glaucoma     both eyes Drs. Arrie [place] and [place]  . Hematuria syndrome     s/p negative cystoscopy  . GERD (gastroesophageal reflux disease) 2003    EGD  . Allergy   . Hyperlipidemia   . Obstructive sleep apnea of adult     CPAP at 8 cm H20  . Noncompliance with medication regimen   . 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 by [facility]: low grade noninvasive  . Chronic kidney disease (CKD), stage III (moderate)     with proteinuria  . Glaucoma   . Hypertension   . Diabetes mellitus     Type 2  . Cataract due to secondary diabetes mellitus ([place])   . COPD (chronic obstructive pulmonary disease) ([place])     secondary to tobacco abuse  . History of tobacco abuse     60 pack year history; quit 1980    Patient Active Problem List   Diagnosis Date Noted  . Acute respiratory failure ([place]) 05/21/2015  . CVA (cerebral infarction) 04/15/2015  . Altered mental status 04/13/2015  . Basal ganglia infarction ([place]) 03/31/2015  . Acute delirium 03/29/2015  . Altered mental state 03/28/2015  . Acute  diastolic CHF (congestive heart failure) ([place]) 01/06/2015  . ESRD on dialysis ([place]) 01/06/2015  . Hypertensive urgency 01/02/2015  . Chest pain, localized 09/02/2011  . Adrenal nodule ([place]) 07/04/2011  . Cataract due to secondary diabetes mellitus ([place])   . Glaucoma   . COPD (chronic obstructive pulmonary disease) ([place])   . History of tobacco abuse   . Gout attack 04/12/2011  . Back pain, lumbosacral 04/07/2011  . Diabetes mellitus type II, uncontrolled ([place]) 04/07/2011  . transitional cell, bladder   . Nephropathy due to secondary diabetes mellitus ([place]) 10/21/2010  . Hypertension   . Hyperlipidemia   . Obstructive sleep apnea of adult   . Noncompliance with medication regimen     Past Surgical History  Procedure Laterality Date  . Elbow surgery    . Transurethral resection of bladder tumor  Mar 13 2011    [facility]  . Peripheral vascular catheterization N/A 01/03/2015    Procedure: Dialysis/Perma Catheter Insertion;  Surgeon: Katha Cabal, MD;  Location: [place] [facility];  Service: Cardiovascular;  Laterality: N/A;  . Skin graft    . Cataract extraction      Current Outpatient Rx  Name  Route  Sig  Dispense  Refill  . amLODipine (NORVASC) 5 MG tablet   Oral   Take 5 mg by mouth daily.         Marland Kitchen aspirin 81 MG chewable tablet  Oral   Chew 1 tablet (81 mg total) by mouth daily.   30 tablet   0   . calcium acetate (PHOSLO) 667 MG capsule   Oral   Take 2 capsules (1,334 mg total) by mouth 3 (three) times daily with meals.   180 capsule   2   . cloNIDine (CATAPRES) 0.1 MG tablet   Oral   Take 1 tablet (0.1 mg total) by mouth 2 (two) times daily.   60 tablet   2   . clopidogrel (PLAVIX) 75 MG tablet   Oral   Take 1 tablet (75 mg total) by mouth daily.   30 tablet   0   . doxazosin (CARDURA) 8 MG tablet   Oral   Take 8 mg by mouth at bedtime.          . hydrALAZINE (APRESOLINE) 100 MG tablet   Oral   Take 100 mg by mouth 3 (three) times daily.          . insulin aspart (NOVOLOG) 100 UNIT/ML injection   Subcutaneous   Inject 2 Units into the skin 3 (three) times daily before meals.   10 mL   11   . insulin detemir (LEVEMIR) 100 UNIT/ML injection   Subcutaneous   Inject 0.15 mLs (15 Units total) into the skin daily.   10 mL   11   . metoprolol (LOPRESSOR) 50 MG tablet   Oral   Take 50 mg by mouth 2 (two) times daily.           . simvastatin (ZOCOR) 40 MG tablet   Oral   Take 40 mg by mouth at bedtime.         . sodium bicarbonate 650 MG tablet   Oral   Take 650 mg by mouth 2 (two) times daily.           Allergies Losartan and Dristan  Family History  Problem Relation Age of Onset  . Diabetes Mother   . Heart disease Father     Social History Social History  Substance Use Topics  . Smoking status: Former Smoker    Quit date: 10/18/1980  . Smokeless tobacco: Never Used  . Alcohol Use: 1.8 oz/week    3 Cans of beer per week    Review of Systems Caveat secondary to the patient being unresponsive.  ____________________________________________   PHYSICAL EXAM:  VITAL SIGNS: ED Triage Vitals  Enc Vitals Group     BP 05/16/2015 1715 92/40 mmHg     Pulse Rate 05/15/2015 1715 73     Resp 05/13/2015 1715 28     Temp 05/19/2015 1715 97.1 F (36.2 C)     Temp Source 05/01/2015 1715 Axillary     SpO2 05/24/2015 1709 84 %     Weight 05/27/2015 1715 134 lb 3.2 oz (60.873 kg)     Height --      Head Cir --      Peak Flow --      Pain Score --      Pain Loc --      Pain Edu? --      Excl. in [place]? --     Constitutional: Patient ill-appearing. GCS of 6 with spontaneous eye opening but no verbal response and no motor response. Eyes: 2-3 mm bilaterally and minimally responsive. Head: Atraumatic. Nose: Nasal trumpet inserted. Mouth/Throat: Mucous membranes are moist.   Neck: No stridor.   Cardiovascular: Normal rate, regular rhythm. Grossly normal heart sounds.  Respiratory:   No retractions. Lungs CTAB.  Tachypneic. Gastrointestinal: Soft . No distention. No abdominal bruits.  Musculoskeletal: No lower extremity tenderness nor edema.  No joint effusions. Neurologic:  GCS of 6. Skin:  Skin is warm, dry and intact. No rash noted.  ____________________________________________   LABS (all labs ordered are listed, but only abnormal results are displayed)  Labs Reviewed  CBC - Abnormal; Notable for the following:    WBC 35.1 (*)    RBC 3.26 (*)    Hemoglobin 9.5 (*)    HCT 29.2 (*)    All other components within normal limits  DIFFERENTIAL - Abnormal; Notable for the following:    Neutro Abs 33.0 (*)    Lymphs Abs 0.5 (*)    Monocytes Absolute 1.5 (*)    All other components within normal limits  COMPREHENSIVE METABOLIC PANEL - Abnormal; Notable for the following:    Potassium 3.2 (*)    Chloride 97 (*)    Glucose, Bld 144 (*)    Creatinine, Ser 1.68 (*)    Total Protein 6.3 (*)    Albumin 2.4 (*)    AST 50 (*)    Alkaline Phosphatase 132 (*)    GFR calc non Af Amer 38 (*)    GFR calc Af Amer 44 (*)    All other components within normal limits  TROPONIN I - Abnormal; Notable for the following:    Troponin I 0.25 (*)    All other components within normal limits  URINALYSIS COMPLETEWITH MICROSCOPIC ([facility] ONLY) - Abnormal; Notable for the following:    Color, Urine YELLOW (*)    APPearance CLOUDY (*)    Hgb urine dipstick 1+ (*)    Protein, ur 100 (*)    Leukocytes, UA 1+ (*)    Bacteria, UA MANY (*)    All other components within normal limits  CULTURE, BLOOD (ROUTINE X 2)  CULTURE, BLOOD (ROUTINE X 2)  URINE CULTURE  URINE CULTURE  CULTURE, BLOOD (ROUTINE X 2)  CULTURE, BLOOD (ROUTINE X 2)  CULTURE, RESPIRATORY (NON-EXPECTORATED)  PROTIME-INR  APTT  LACTIC ACID, PLASMA  LACTIC ACID, PLASMA  URINALYSIS COMPLETEWITH MICROSCOPIC ([facility] ONLY)  BLOOD GAS, ARTERIAL  CBC  BASIC METABOLIC PANEL  BLOOD GAS, ARTERIAL  MAGNESIUM  PHOSPHORUS  BLOOD GAS, ARTERIAL  CBC   CREATININE, SERUM   ____________________________________________  EKG  ED ECG REPORT I, [place],  Youlanda Roys, the attending physician, personally viewed and interpreted this ECG.   Date: 05/10/2015  EKG Time: 1753  Rate: 95  Rhythm: normal sinus rhythm  Axis: Normal axis  Intervals:none  ST&T Change: No ST segment elevation or depression. No abnormal T-wave inversion.  ____________________________________________  RADIOLOGY  IMPRESSION: 1. No edema or cardiomegaly. Support apparatus satisfactorily position. 2. Bony demineralization. 3. Atherosclerosis.   Electronically Signed By: Van Clines M.D. On: 05/14/2015 18:13       ____________________________________________   PROCEDURES  INTUBATION Performed by: Doran Stabler  Required items: required blood products, implants, devices, and special equipment available Patient identity confirmed: provided demographic data and hospital-assigned identification number Time out: Immediately prior to procedure a "time out" was called to verify the correct patient, procedure, equipment, support staff and site/side marked as required.  Indications: Airway protection   Intubation method: Glidescope Laryngoscopy   Preoxygenation: BVM  Sedatives: Versed  Paralytic: Rock urine M   Tube Size: 7.5 cuffed  Post-procedure assessment: chest rise and ETCO2 monitor Breath sounds: equal and absent over the epigastrium Tube  secured with: ETT holder Chest x-ray interpreted by radiologist and me.  Chest x-ray findings: endotracheal tube to be repositioned 2cm deeper  Patient tolerated the procedure well with no immediate complications.  CRITICAL CARE Performed by: Doran Stabler   Total critical care time: 35 minutes  Critical care time was exclusive of separately billable procedures and treating other patients.  Critical care was necessary to treat or prevent imminent or life-threatening  deterioration.  Critical care was time spent personally by me on the following activities: development of treatment plan with patient and/or surrogate as well as nursing, discussions with consultants, evaluation of patient's response to treatment, examination of patient, obtaining history from patient or surrogate, ordering and performing treatments and interventions, ordering and review of laboratory studies, ordering and review of radiographic studies, pulse oximetry and re-evaluation of patient's condition.   ____________________________________________   INITIAL IMPRESSION / ASSESSMENT AND PLAN / ED COURSE  Pertinent labs & imaging results that were available during my care of the patient were reviewed by me and considered in my medical decision making (see chart for details).  ----------------------------------------- 6:44 PM on 05/17/2015 -----------------------------------------  Spoke with patient's family including his wife who says that the patient has been treated for an aspiration pneumonia and has been on antibiotics a week. Says that throughout the week he has been increasingly weak. I updated him about the need for [facility] of the patient was placed on a ventilator because very protection. The patient will be admitted to the ICU. I discussed case Dr. Corinna Lines, the ICU attending, was excepted the patient to the ICU. ____________________________________________   FINAL CLINICAL IMPRESSION(S) / ED DIAGNOSES  Final diagnoses:  Altered mental status  Altered mental status, unspecified altered mental status type  Sepsis. UTI.    Orbie Pyo, MD 05/16/2015 1845  22, 18-gauge angiocatheter for place and the bilateral basilic veins by me under ultrasound GUIDANCE.  There was good blood return and they flushed easily.  The shunt attempt was after multiple peripheral attempts including an EJ attempted left side which was unsuccessful.  Orbie Pyo,  MD 05/19/2015 [phone]

## 2015-05-18 NOTE — H&P (Addendum)
PULMONARY / CRITICAL CARE MEDICINE   Name: Jason Aguilar MRN: [id number] DOB: 12-30-1935    ADMISSION DATE:  05/24/2015  REFERRING MD :  Dr. Dineen Kid (ER)   CHIEF COMPLAINT:     Unresponsive, lethargic, intuated   HISTORY OF PRESENT ILLNESS    80 year old male past medical history of end-stage renal disease on dialysis, recent CVA, hematuria syndrome, GERD,hyperlipidemia, OSA on CPAP, bladder cell carcinoma, diabetes type 2, minute from the ER after being found unresponsive after dialysis with hypoxia. History per wife at bedside and chart review. Y states that patient had a recent admission to [facility] little over a month ago, he was diagnosed with a stroke, his main neurological deficits were some right-sided tingling, and dysphagia, he was transferred to [facility] for neurologic rehabilitation. Over the last week he has had depressed mentation, increased confusion, and lethargy. Today he had his dialysis session, and after being picked up and brought back to [facility] he was noted to be lethargic and unresponsive to sternal rub. EMS was called patient was brought to the ER he was noted to have saturation of 84% and not responding to verbal stimuli, not able to clear his secretions, he was subsequently intubated for airway protection. CCM was called for further admission and management.    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 He was started on Plavix and statin and discharged to [facility] for neurorehab   PAST MEDICAL HISTORY    :  Past Medical History  Diagnosis Date  . Glaucoma     both eyes Drs. Arrie [place] and [place]  . Hematuria syndrome     s/p negative cystoscopy  . GERD (gastroesophageal reflux disease) 2003    EGD  . Allergy   . Hyperlipidemia   . Obstructive sleep apnea of adult     CPAP at 8 cm H20  .  Noncompliance with medication regimen   . 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 by [facility]: low grade noninvasive  . Chronic kidney disease (CKD), stage III (moderate)     with proteinuria  . Glaucoma   . Hypertension   . Diabetes mellitus     Type 2  . Cataract due to secondary diabetes mellitus ([place])   . COPD (chronic obstructive pulmonary disease) ([place])     secondary to tobacco abuse  . History of tobacco abuse     60 pack year history; quit 1980   Past Surgical History  Procedure Laterality Date  . Elbow surgery    . Transurethral resection of bladder tumor  Mar 13 2011    [facility]  . Peripheral vascular catheterization N/A 01/03/2015    Procedure: Dialysis/Perma Catheter Insertion;  Surgeon: Katha Cabal, MD;  Location: [place] [facility];  Service: Cardiovascular;  Laterality: N/A;  . Skin graft    . Cataract extraction     Prior to Admission medications   Medication Sig Start Date End Date Taking? Authorizing Provider  amLODipine (NORVASC) 5 MG tablet Take 5 mg by mouth daily.    Historical Provider, MD  aspirin 81 MG chewable tablet Chew 1 tablet (81 mg total) by mouth daily. 04/05/15   [name], MD  calcium acetate (PHOSLO) 667 MG capsule Take 2 capsules (1,334 mg total) by mouth 3 (three) times daily with meals. 01/06/15   Gladstone Lighter, MD  cloNIDine (CATAPRES) 0.1 MG tablet Take 1 tablet (  0.1 mg total) by mouth 2 (two) times daily. 01/06/15   Gladstone Lighter, MD  clopidogrel (PLAVIX) 75 MG tablet Take 1 tablet (75 mg total) by mouth daily. 04/05/15   [name], MD  doxazosin (CARDURA) 8 MG tablet Take 8 mg by mouth at bedtime.     Historical Provider, MD  hydrALAZINE (APRESOLINE) 100 MG tablet Take 100 mg by mouth 3 (three) times daily.    Historical Provider, MD  insulin aspart (NOVOLOG) 100 UNIT/ML injection Inject 2 Units into the skin 3 (three) times daily before meals. 04/21/15   Sital Mody, MD  insulin detemir (LEVEMIR) 100  UNIT/ML injection Inject 0.15 mLs (15 Units total) into the skin daily. 04/21/15   Bettey Costa, MD  metoprolol (LOPRESSOR) 50 MG tablet Take 50 mg by mouth 2 (two) times daily.      Historical Provider, MD  simvastatin (ZOCOR) 40 MG tablet Take 40 mg by mouth at bedtime.    Historical Provider, MD  sodium bicarbonate 650 MG tablet Take 650 mg by mouth 2 (two) times daily.    Historical Provider, MD   Allergies  Allergen Reactions  . Losartan Swelling  . Dristan Rash     FAMILY HISTORY   Family History  Problem Relation Age of Onset  . Diabetes Mother   . Heart disease Father       SOCIAL HISTORY    reports that he quit smoking about 34 years ago. He has never used smokeless tobacco. He reports that he drinks about 1.8 oz of alcohol per week. He reports that he does not use illicit drugs.  Review of Systems  Unable to perform ROS     VITAL SIGNS    Temp:  [97.1 F (36.2 C)-99.3 F (37.4 C)] 98.8 F (37.1 C) (04/20 1800) Pulse Rate:  [73-98] 95 (04/20 1800) Resp:  [22-28] 22 (04/20 1800) BP: (92-144)/(40-60) 109/44 mmHg (04/20 1800) SpO2:  [84 %-100 %] 99 % (04/20 1800) FiO2 (%):  [40 %] 40 % (04/20 1740) Weight:  [134 lb 3.2 oz (60.873 kg)] 134 lb 3.2 oz (60.873 kg) (04/20 1715) HEMODYNAMICS:   VENTILATOR SETTINGS: Vent Mode:  [-] AC FiO2 (%):  [40 %] 40 % Set Rate:  [18 bmp] 18 bmp Vt Set:  [500 mL] 500 mL PEEP:  [5 cmH20] 5 cmH20 INTAKE / OUTPUT: No intake or output data in the 24 hours ending 05/08/2015 1830     PHYSICAL EXAM   Physical Exam  Constitutional: He appears well-developed and well-nourished.  HENT:  Head: Normocephalic and atraumatic.  Right Ear: External ear normal.  Left Ear: External ear normal.  Mouth/Throat: Oropharynx is clear and moist.  Eyes:  Sluggish pupils  Neck: Neck supple. No thyromegaly present.  Cardiovascular: Normal rate, regular rhythm, normal heart sounds and intact distal pulses.   No murmur  heard. Pulmonary/Chest: He is in respiratory distress. He has no wheezes. He has no rales. He exhibits no tenderness.  On MV, coarse upper airway sounds, no wheezes  Abdominal: Soft. Bowel sounds are normal. He exhibits no distension.  Genitourinary: Penis normal.  Neurological:  sedated  Skin: Skin is warm and dry.       LABS   LABS:  CBC  Recent Labs Lab 05/20/2015 1726  WBC 35.1*  HGB 9.5*  HCT 29.2*  PLT 329   Coag's  Recent Labs Lab 05/04/2015 1726  APTT 31  INR 0.96   BMET  Recent Labs Lab 04/30/2015 1726  NA 137  K  3.2*  CL 97*  CO2 26  BUN 16  CREATININE 1.68*  GLUCOSE 144*   Electrolytes  Recent Labs Lab 05/08/2015 1726  CALCIUM 10.0   Sepsis Markers No results for input(s): LATICACIDVEN, PROCALCITON, O2SATVEN in the last 168 hours. ABG No results for input(s): PHART, PCO2ART, PO2ART in the last 168 hours. Liver Enzymes  Recent Labs Lab 05/26/2015 1726  AST 50*  ALT 27  ALKPHOS 132*  BILITOT 0.5  ALBUMIN 2.4*   Cardiac Enzymes  Recent Labs Lab 05/21/2015 1726  TROPONINI 0.25*   Glucose No results for input(s): GLUCAP in the last 168 hours.   No results found for this or any previous visit (from the past 240 hour(s)).   Current facility-administered medications:  .  etomidate (AMIDATE) injection 20 mg, 20 mg, Intravenous, Once, Orbie Pyo, MD, 20 mg at 05/20/2015 1730 .  fentaNYL 2554mcg in NS 239mL (63mcg/ml) infusion-PREMIX, 25 mcg/hr, Intravenous, Continuous, Orbie Pyo, MD, Last Rate: 5 mL/hr at 04/29/2015 1827, 50 mcg/hr at 05/20/2015 1827 .  midazolam (VERSED) 50 mg in sodium chloride 0.9 % 50 mL (1 mg/mL) infusion, 1 mg/hr, Intravenous, Continuous, Orbie Pyo, MD .  piperacillin-tazobactam (ZOSYN) IVPB 3.375 g, 3.375 g, Intravenous, Once, Orbie Pyo, MD, Last Rate: 100 mL/hr at 05/02/2015 1826, 3.375 g at 05/03/2015 1826 .  sodium chloride 0.9 % bolus 1,000 mL, 1,000 mL, Intravenous,  Q1H, Orbie Pyo, MD, Last Rate: 1,000 mL/hr at 05/26/2015 1800, 1,000 mL at 05/13/2015 1800 .  vancomycin (VANCOCIN) IVPB 1000 mg/200 mL premix, 1,000 mg, Intravenous, Once, Orbie Pyo, MD  Current outpatient prescriptions:  .  amLODipine (NORVASC) 5 MG tablet, Take 5 mg by mouth daily., Disp: , Rfl:  .  aspirin 81 MG chewable tablet, Chew 1 tablet (81 mg total) by mouth daily., Disp: 30 tablet, Rfl: 0 .  calcium acetate (PHOSLO) 667 MG capsule, Take 2 capsules (1,334 mg total) by mouth 3 (three) times daily with meals., Disp: 180 capsule, Rfl: 2 .  cloNIDine (CATAPRES) 0.1 MG tablet, Take 1 tablet (0.1 mg total) by mouth 2 (two) times daily., Disp: 60 tablet, Rfl: 2 .  clopidogrel (PLAVIX) 75 MG tablet, Take 1 tablet (75 mg total) by mouth daily., Disp: 30 tablet, Rfl: 0 .  doxazosin (CARDURA) 8 MG tablet, Take 8 mg by mouth at bedtime. , Disp: , Rfl:  .  hydrALAZINE (APRESOLINE) 100 MG tablet, Take 100 mg by mouth 3 (three) times daily., Disp: , Rfl:  .  insulin aspart (NOVOLOG) 100 UNIT/ML injection, Inject 2 Units into the skin 3 (three) times daily before meals., Disp: 10 mL, Rfl: 11 .  insulin detemir (LEVEMIR) 100 UNIT/ML injection, Inject 0.15 mLs (15 Units total) into the skin daily., Disp: 10 mL, Rfl: 11 .  metoprolol (LOPRESSOR) 50 MG tablet, Take 50 mg by mouth 2 (two) times daily.  , Disp: , Rfl:  .  simvastatin (ZOCOR) 40 MG tablet, Take 40 mg by mouth at bedtime., Disp: , Rfl:  .  sodium bicarbonate 650 MG tablet, Take 650 mg by mouth 2 (two) times daily., Disp: , Rfl:   IMAGING    Dg Chest Port 1 View  05/09/2015  CLINICAL DATA:  Unresponsive at dialysis today. Sleep apnea. Diabetes. COPD. EXAM: PORTABLE CHEST 1 VIEW COMPARISON:  03/29/2015 FINDINGS: Right IJ dialysis catheter tip: Cavoatrial junction. Endotracheal tube tip: 5 cm above the carina. Nasogastric tube enters the stomach. Atherosclerotic aortic arch. No cardiomegaly. Thoracic spondylosis. Bony  demineralization.  The lungs appear clear. IMPRESSION: 1. No edema or cardiomegaly. Support apparatus satisfactorily position. 2. Bony demineralization. 3. Atherosclerosis. Electronically Signed   By: Van Clines M.D.   On: 05/22/2015 18:13      Indwelling Urinary Catheter continued, requirement due to   Reason to continue Indwelling Urinary Catheter for strict Intake/Output monitoring for hemodynamic instability   Central Line continued, requirement due to   Reason to continue [employer] Monitoring of central venous pressure or other hemodynamic parameters   Ventilator continued, requirement due to, resp failure    Ventilator Sedation RASS 0 to -2   Cultures: BCx2  UC  Sputum  Antibiotics: Vanc 4/20>> Zosyn 4/20>>  Lines:   ASSESSMENT/PLAN  80 year old male past medical history of CVA, end-stage renal disease on dialysis, Chorea, admitted for unresponsiveness and lethargy, intubated in the ER.   PULMONARY OETT VDRF Lethargy ?Sepsis Unreponsive ? Another CVA COPD P:   -MV,wean as tolerated - SBT/WUA - fentanyl gtt/versed prn - ABG - BMP/CBC - LA, PCT> - cxr review, no new inflitrates - f/u CT head  -prn nebulizers  CARDIOVASCULAR Elevated troponin -pressors as needed -maintain MAP >65 -trend CE   RENAL ESRD on HD hypokalemia P:   - ICU electrolyte replacement protocol - on HD, got treatment today - nephro consult   GASTROINTESTINAL P:   - PPI -  HEMATOLOGIC AOCD P:  -f/u CBC -DVT Prop - SCD, can start hep pending head CT.   INFECTIOUS Sepsis - source unknown P:   - PCT>> -empiric abx as stated above  ENDOCRINE DM 2 P:   - ICU hyperglycemic protocol  NEUROLOGIC Hx of CVA - ishemic and hemorrhagic transformation  ? Another CVA RASS goal: 0 -f/u head CT - hold plavix and asa for now.   Family (wife and daughter) updated at bedside.   I have personally obtained a history, examined the patient, evaluated laboratory  and imaging results, formulated the assessment and plan and placed orders.  The Patient requires high complexity decision making for assessment and support, frequent evaluation and titration of therapies, application of advanced monitoring technologies and extensive interpretation of multiple databases. Critical Care Time devoted to patient care services described in this note is 45 minutes.   Overall, patient is critically ill, prognosis is guarded. Patient at high risk for cardiac arrest and death.   Vilinda Boehringer, MD [place] Pager [phone] (please enter 7-digits) On Call Pager [phone] (please enter 7-digits)     05/17/2015, 6:30 PM  Note: This note was prepared with Dragon dictation along with smaller phrase technology. Any transcriptional errors that result from this process are unintentional.

## 2015-05-18 NOTE — ED Notes (Signed)
Pt arrived via EMS emergency traffic from [facility]. Pt went to dialysis today and per dialysis staff was largely unresponsive with snoring respirations throughout the entire treatment. [facility] to [facility] and was left in wheelchair with snoring respirations and in same position when EMS arrived. Upon EMS arrival, pt unresponsive. Initial BP that EMS got was 120/18 manually, in deep trendelenburg EMS got 124/37. EMS put NPA in due to O2 sats being 84% on RA. Upon arrival to ER pt will occasionally open eyes to voice, will not follow commands, responds to pain.

## 2015-05-18 NOTE — Consult Note (Signed)
Pharmacy Antibiotic Note  Jason Aguilar is a 80 y.o. male admitted on 05/16/2015 with sepsis.  Pharmacy has been consulted for vancomycin and zosyn dosing.  Plan: Vancomycin 1 g once, then 10hr later start 750mg  q 24 Vancomycin 750 mg IV every 24 hours.  Goal trough 15-20 mcg/mL. Zosyn 3.375g IV q8h (4 hour infusion). Will check trough at steady state. 0424 0530  Weight: 134 lb 3.2 oz (60.873 kg)  Temp (24hrs), Avg:98.4 F (36.9 C), Min:97.1 F (36.2 C), Max:99.3 F (37.4 C)   Recent Labs Lab 05/03/2015 1726  WBC 35.1*  CREATININE 1.68*    Estimated Creatinine Clearance: 31.7 mL/min (by C-G formula based on Cr of 1.68).    Allergies  Allergen Reactions  . Losartan Swelling  . Dristan Rash    Antimicrobials this admission: vancomycin 4/20 >>  zosyn 4/20 >>   Dose adjustments this admission:   Microbiology results: 4/20 BCx: pending 4/20 UCx: pending  4/20 Sputum: needs to be collected    Thank you for allowing pharmacy to be a part of this patient's care.  Ramond Dial, Pharm.D Clinical Pharmacist 05/20/2015 6:50 PM

## 2015-05-18 NOTE — Procedures (Signed)
  Procedure Note: [facility] Venous Catheter Placement  Jason Aguilar , [id number] , [id number]  Indications: Hemodynamic monitoring / Intravenous access  Benefits, risks (including bleeding, infection,  Injury, etc.), and alternatives explained to Wife who voiced understanding.  Questions were sought and answered.   Wife agreed to proceed with the procedure.  Consent is signed and on chart. A time-out was completed verifying correct patient, procedure and site.  A 3 lumen catheter available at the time of procedure.  The patient was placed in a dependent position appropriate for central line placement based on the vein to be cannulated.   The patient's Left Internal Jugular Vein was prepped and draped in a sterile fashion.  1% Lidocaine WAS used to anesthetize the surrounding skin area.   A 3 lumen catheter was introduced into the LEFT Internal Jugular Vein using Seldinger technique, visualized under ultrasound.  The catheter was threaded smoothly over the guide wire and appropriate blood return was obtained.  Each lumen of the catheter was evacuated of air and flushed with sterile saline.  The catheter was then sutured in place to the skin and a sterile dressing applied.  Perfusion to the extremity distal to the point of catheter insertion was checked and found to be adequate.    Chest X-ray was ordered for confirmation of placement.  The patient tolerated the procedure well and there were no complications.  Vilinda Boehringer, MD [place] [facility] Pager [phone] (please enter 7-digits) On Call Pager - [phone] (please enter 7-digits)

## 2015-05-19 ENCOUNTER — Inpatient Hospital Stay: Payer: Medicare Other

## 2015-05-19 DIAGNOSIS — J690 Pneumonitis due to inhalation of food and vomit: Secondary | ICD-10-CM

## 2015-05-19 LAB — CBC
HCT: 24.7 % — ABNORMAL LOW (ref 40.0–52.0)
Hemoglobin: 8.1 g/dL — ABNORMAL LOW (ref 13.0–18.0)
MCH: 29.2 pg (ref 26.0–34.0)
MCHC: 33 g/dL (ref 32.0–36.0)
MCV: 88.6 fL (ref 80.0–100.0)
PLATELETS: 262 10*3/uL (ref 150–440)
RBC: 2.79 MIL/uL — ABNORMAL LOW (ref 4.40–5.90)
RDW: 13.8 % (ref 11.5–14.5)
WBC: 26.3 10*3/uL — ABNORMAL HIGH (ref 3.8–10.6)

## 2015-05-19 LAB — GLUCOSE, CAPILLARY
GLUCOSE-CAPILLARY: 136 mg/dL — AB (ref 65–99)
Glucose-Capillary: 149 mg/dL — ABNORMAL HIGH (ref 65–99)
Glucose-Capillary: 150 mg/dL — ABNORMAL HIGH (ref 65–99)
Glucose-Capillary: 165 mg/dL — ABNORMAL HIGH (ref 65–99)
Glucose-Capillary: 168 mg/dL — ABNORMAL HIGH (ref 65–99)
Glucose-Capillary: 201 mg/dL — ABNORMAL HIGH (ref 65–99)

## 2015-05-19 LAB — BASIC METABOLIC PANEL
Anion gap: 9 (ref 5–15)
BUN: 33 mg/dL — ABNORMAL HIGH (ref 6–20)
CALCIUM: 10.1 mg/dL (ref 8.9–10.3)
CO2: 27 mmol/L (ref 22–32)
CREATININE: 1.85 mg/dL — AB (ref 0.61–1.24)
Chloride: 103 mmol/L (ref 101–111)
GFR calc non Af Amer: 33 mL/min — ABNORMAL LOW (ref >60)
GFR, EST AFRICAN AMERICAN: 39 mL/min — AB (ref >60)
Glucose, Bld: 160 mg/dL — ABNORMAL HIGH (ref 65–99)
Potassium: 3.7 mmol/L (ref 3.5–5.1)
SODIUM: 139 mmol/L (ref 135–145)

## 2015-05-19 LAB — TROPONIN I
Troponin I: 0.17 ng/mL — ABNORMAL HIGH (ref <0.031)
Troponin I: 0.06 ng/mL — ABNORMAL HIGH (ref <0.031)

## 2015-05-19 LAB — MRSA PCR SCREENING: MRSA BY PCR: NEGATIVE

## 2015-05-19 LAB — PROCALCITONIN: PROCALCITONIN: 10.13 ng/mL

## 2015-05-19 LAB — CKMB (ARMC ONLY)
CK, MB: 12.9 ng/mL — ABNORMAL HIGH (ref 0.5–5.0)
CK, MB: 1.3 ng/mL (ref 0.5–5.0)

## 2015-05-19 LAB — MAGNESIUM: MAGNESIUM: 1.7 mg/dL (ref 1.7–2.4)

## 2015-05-19 MED ORDER — VANCOMYCIN HCL IN DEXTROSE 750-5 MG/150ML-% IV SOLN
750.0000 mg | INTRAVENOUS | Status: DC
  Administered 2015-05-19: 750 mg via INTRAVENOUS
  Filled 2015-05-19: qty 150

## 2015-05-19 MED ORDER — CHLORHEXIDINE GLUCONATE 0.12% ORAL RINSE (MEDLINE KIT)
15.0000 mL | Freq: Two times a day (BID) | OROMUCOSAL | Status: DC
  Administered 2015-05-19 – 2015-05-21 (×4): 15 mL via OROMUCOSAL
  Filled 2015-05-19 (×6): qty 15

## 2015-05-19 MED ORDER — FREE WATER: 100.0000 mL | Freq: Three times a day (TID) | Status: DC

## 2015-05-19 MED ORDER — VANCOMYCIN HCL 500 MG IV SOLR
500.0000 mg | INTRAVENOUS | Status: DC
Start: 2015-05-20 — End: 2015-05-22
  Administered 2015-05-20: 500 mg via INTRAVENOUS
  Filled 2015-05-19: qty 500

## 2015-05-19 MED ORDER — HEPARIN SODIUM (PORCINE) 5000 UNIT/ML IJ SOLN
5000.0000 [IU] | Freq: Three times a day (TID) | INTRAMUSCULAR | Status: DC
  Administered 2015-05-19 – 2015-05-23 (×11): 5000 [IU] via SUBCUTANEOUS
  Filled 2015-05-19 (×11): qty 1

## 2015-05-19 MED ORDER — ANTISEPTIC ORAL RINSE SOLUTION (CORINZ)
7.0000 mL | Freq: Four times a day (QID) | OROMUCOSAL | Status: DC
  Administered 2015-05-19 – 2015-05-21 (×7): 7 mL via OROMUCOSAL
  Filled 2015-05-19 (×12): qty 7

## 2015-05-19 MED ORDER — PIPERACILLIN-TAZOBACTAM 3.375 G IVPB
3.3750 g | Freq: Two times a day (BID) | INTRAVENOUS | Status: DC
  Administered 2015-05-19 – 2015-05-23 (×8): 3.375 g via INTRAVENOUS
  Filled 2015-05-19 (×9): qty 50

## 2015-05-19 MED ORDER — VITAL HIGH PROTEIN PO LIQD
1000.0000 mL | ORAL | Status: DC
  Administered 2015-05-19 – 2015-05-22 (×2): 1000 mL

## 2015-05-19 NOTE — Care Management (Signed)
Presents from [facility] after returning from dialysis and being found to be unresponsive. Recently discharge 03/2015 from [facility] secondary to a CVA. Receives dialysis Tues-Thurs- Sat with [facility] referral made. Currently intubated with signs of aspiration PNA, sepsis.

## 2015-05-19 NOTE — Progress Notes (Signed)
Notified Dr. Jimmy Footman of Troponin of 0.17 and CKMB 12.9 increased since last draw. No new orders received.

## 2015-05-19 NOTE — Plan of Care (Signed)
Problem: Consults Goal: Ventilated Patients Patient Education See Patient Education Module for education specifics.  Outcome: Not Met (add Reason) Unable to provide education at this time due to pt being non-verbal  Problem: Phase I Progression Outcomes Goal: Voiding-avoid urinary catheter unless indicated Outcome: Completed/Met Date Met:  05/19/15 Urinary catheter removed due to anuria     Problem: Phase II Progression Outcomes Goal: Date pt extubated/weaned off vent Outcome: Not Met (add Reason) Pt continues to be on the vent

## 2015-05-19 NOTE — H&P (Signed)
PULMONARY / CRITICAL CARE MEDICINE   Name: Jason Aguilar MRN: [id number] DOB: 1935-05-05    ADMISSION DATE:  05/17/2015  REFERRING MD :  Dr. Dineen Kid (ER)   CHIEF COMPLAINT:     Unresponsive, lethargic, intuated   HISTORY OF PRESENT ILLNESS    80 year old male past medical history of end-stage renal disease on dialysis, recent CVA, hematuria syndrome, GERD,hyperlipidemia, OSA on CPAP, bladder cell carcinoma, diabetes type 2, minute from the ER after being found unresponsive after dialysis with hypoxia. History per wife at bedside and chart review. Y states that patient had a recent admission to [facility] little over a month ago, he was diagnosed with a stroke, his main neurological deficits were some right-sided tingling, and dysphagia, he was transferred to [facility] for neurologic rehabilitation. Over the last week he has had depressed mentation, increased confusion, and lethargy. Today he had his dialysis session, and after being picked up and brought back to [facility] he was noted to be lethargic and unresponsive to sternal rub. EMS was called patient was brought to the ER he was noted to have saturation of 84% and not responding to verbal stimuli, not able to clear his secretions, he was subsequently intubated for airway protection. CCM was called for further admission and management.    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 He was started on Plavix and statin and discharged to [facility] for neurorehab  Patient now intubated and sedated, on full vent support   Review of Systems  Unable to perform ROS: critical illness      VITAL SIGNS    Temp:  [97.1 F (36.2 C)-99.5 F (37.5 C)] 98.6 F (37 C) (04/21 1113) Pulse Rate:  [63-98] 83 (04/21 0900) Resp:  [14-28] 18 (04/21 0900) BP: (90-156)/(37-92) 108/43 mmHg (04/21  0900) SpO2:  [84 %-100 %] 99 % (04/21 0900) FiO2 (%):  [40 %] 40 % (04/21 1152) Weight:  [127 lb 13.9 oz (58 kg)-134 lb 3.2 oz (60.873 kg)] 127 lb 13.9 oz (58 kg) (04/21 0234) HEMODYNAMICS:   VENTILATOR SETTINGS: Vent Mode:  [-] PRVC FiO2 (%):  [40 %] 40 % Set Rate:  [18 bmp] 18 bmp Vt Set:  [500 mL] 500 mL PEEP:  [5 cmH20] 5 cmH20 INTAKE / OUTPUT:  Intake/Output Summary (Last 24 hours) at 05/19/15 1220 Last data filed at 05/19/15 0800  Gross per 24 hour  Intake 2151.34 ml  Output    208 ml  Net 1943.34 ml       PHYSICAL EXAM   Physical Exam  Constitutional: He appears well-developed and well-nourished.  HENT:  Head: Normocephalic and atraumatic.  Right Ear: External ear normal.  Left Ear: External ear normal.  Mouth/Throat: Oropharynx is clear and moist.  Eyes:  Sluggish pupils  Neck: Neck supple. No thyromegaly present.  Cardiovascular: Normal rate, regular rhythm, normal heart sounds and intact distal pulses.   No murmur heard. Pulmonary/Chest: He is in respiratory distress. He has no wheezes. He has no rales. He exhibits no tenderness.  On MV, coarse upper airway sounds, no wheezes  Abdominal: Soft. Bowel sounds are normal. He exhibits no distension.  Genitourinary: Penis normal.  Neurological:  Sedated, gcs<8T  Skin: Skin is warm and dry.       LABS   LABS:  CBC  Recent Labs Lab 05/16/2015 1726 05/19/15 0437  WBC 35.1* 26.3*  HGB 9.5* 8.1*  HCT 29.2*  24.7*  PLT 329 262   Coag's  Recent Labs Lab 05/05/2015 1726  APTT 31  INR 0.96   BMET  Recent Labs Lab 04/29/2015 1726 05/07/2015 2225 05/19/15 0437  NA 137 137 139  K 3.2* 3.2* 3.7  CL 97* 102 103  CO2 26 27 27   BUN 16 24* 33*  CREATININE 1.68* 1.76* 1.85*  GLUCOSE 144* 190* 160*   Electrolytes  Recent Labs Lab 05/16/2015 1726 05/20/2015 2225 05/19/15 0437  CALCIUM 10.0 9.6 10.1  MG  --   --  1.7   Sepsis Markers  Recent Labs Lab 05/28/2015 1726 05/16/2015 2225 05/19/15 0437   LATICACIDVEN 3.5* 1.7  --   PROCALCITON  --  9.01 10.13   ABG  Recent Labs Lab 04/30/2015 2200  PHART 7.54*  PCO2ART 32  PO2ART 116*   Liver Enzymes  Recent Labs Lab 05/08/2015 1726  AST 50*  ALT 27  ALKPHOS 132*  BILITOT 0.5  ALBUMIN 2.4*   Cardiac Enzymes  Recent Labs Lab 05/27/2015 2225 05/19/15 0233 05/19/15 1109  TROPONINI 0.07* 0.17* 0.06*   Glucose  Recent Labs Lab 05/19/15 0018 05/19/15 0431 05/19/15 0750 05/19/15 1143  GLUCAP 168* 150* 136* 165*     Recent Results (from the past 240 hour(s))  Urine culture     Status: None (Preliminary result)   Collection Time: 05/08/2015  5:26 PM  Result Value Ref Range Status   Specimen Description URINE, RANDOM  Final   Special Requests NONE  Final   Culture TOO YOUNG TO READ  Final   Report Status PENDING  Incomplete  Blood Culture (routine x 2)     Status: None (Preliminary result)   Collection Time: 05/03/2015  6:13 PM  Result Value Ref Range Status   Specimen Description BLOOD LEFT ARM  Final   Special Requests BOTTLES DRAWN AEROBIC AND ANAEROBIC [place]  Final   Culture NO GROWTH < 24 HOURS  Final   Report Status PENDING  Incomplete  Blood Culture (routine x 2)     Status: None (Preliminary result)   Collection Time: 05/17/2015  6:13 PM  Result Value Ref Range Status   Specimen Description BLOOD RIGHT ASSIST CONTROL  Final   Special Requests BOTTLES DRAWN AEROBIC AND ANAEROBIC 8CC  Final   Culture NO GROWTH < 12 HOURS  Final   Report Status PENDING  Incomplete  MRSA PCR Screening     Status: None   Collection Time: 05/24/2015 10:29 PM  Result Value Ref Range Status   MRSA by PCR NEGATIVE NEGATIVE Final    Comment:        The GeneXpert MRSA Assay (FDA approved for NASAL specimens only), is one component of a comprehensive MRSA colonization surveillance program. It is not intended to diagnose MRSA infection nor to guide or monitor treatment for MRSA infections.      Current facility-administered  medications:  .  0.9 %  sodium chloride infusion, 250 mL, Intravenous, PRN, Bincy S Varughese, NP, Last Rate: 10 mL/hr at 05/19/15 0039, 250 mL at 05/19/15 0039 .  antiseptic oral rinse solution (CORINZ), 7 mL, Mouth Rinse, QID, Vishal Mungal, MD, 7 mL at 05/19/15 1158 .  chlorhexidine gluconate (SAGE KIT) (PERIDEX) 0.12 % solution 15 mL, 15 mL, Mouth Rinse, BID, Vishal Mungal, MD, 15 mL at 05/19/15 1055 .  etomidate (AMIDATE) injection 20 mg, 20 mg, Intravenous, Once, Orbie Pyo, MD, 20 mg at 05/20/2015 1730 .  fentaNYL (SUBLIMAZE) bolus via infusion 25 mcg, 25 mcg,  Intravenous, Q1H PRN, Bincy S Varughese, NP .  fentaNYL 2575mg in NS 2591m(10102mml) infusion-PREMIX, 25-400 mcg/hr, Intravenous, Continuous, Bincy S Varughese, NP, Stopped at 05/19/15 0900 .  heparin injection 5,000 Units, 5,000 Units, Subcutaneous, Q8H, Vishal Mungal, MD .  hydrALAZINE (APRESOLINE) injection 10 mg, 10 mg, Intravenous, Q4H PRN, Vishal Mungal, MD .  insulin aspart (novoLOG) injection 2-6 Units, 2-6 Units, Subcutaneous, Q4H, Vishal Mungal, MD, 4 Units at 05/19/15 1155 .  metoprolol (LOPRESSOR) injection 5 mg, 5 mg, Intravenous, Q6H PRN, Vishal Mungal, MD .  midazolam (VERSED) injection 1 mg, 1 mg, Intravenous, Q15 min PRN, Bincy S Varughese, NP .  midazolam (VERSED) injection 1 mg, 1 mg, Intravenous, Q2H PRN, Bincy S Varughese, NP .  pantoprazole (PROTONIX) EC tablet 40 mg, 40 mg, Oral, Daily, Bincy S Varughese, NP, 40 mg at 05/19/15 1055 .  piperacillin-tazobactam (ZOSYN) IVPB 3.375 g, 3.375 g, Intravenous, Q12H, Vishal Mungal, MD .  senna (SENOKOT) tablet 8.6 mg, 1 tablet, Oral, Daily, Vishal Mungal, MD, 8.6 mg at 05/19/15 1055 .  vancomycin (VANCOCIN) IVPB 750 mg/150 ml premix, 750 mg, Intravenous, Q24H, Bincy S Varughese, NP, 750 mg at 05/19/15 0905  IMAGING    Ct Head Wo Contrast  05/12/2015  CLINICAL DATA:  Patient found unresponsive today. No known injury. Initial encounter. EXAM: CT HEAD WITHOUT  CONTRAST TECHNIQUE: Contiguous axial images were obtained from the base of the skull through the vertex without intravenous contrast. COMPARISON:  Brain MRI 04/15/2015.  Head CT scan 04/13/2015. FINDINGS: Atrophy and extensive chronic microvascular ischemic change are again seen. No evidence of acute intracranial abnormality including hemorrhage, infarct, mass lesion, mass effect, midline shift or abnormal extra-axial fluid collection is seen. Scattered ethmoid air cell disease identified. Mucosal thickening is seen in the right maxillary sinus and unchanged. The calvarium is intact. IMPRESSION: No acute abnormality. Atrophy and extensive chronic microvascular ischemic change. Electronically Signed   By: ThoInge RiseD.   On: 05/15/2015 19:30   Dg Chest Port 1 View  05/19/2015  CLINICAL DATA:  Acute respiratory failure. EXAM: PORTABLE CHEST 1 VIEW COMPARISON:  Yesterday at 2202 hours FINDINGS: Endotracheal tube 3.2 cm from the carina. Enteric tube in place. Tip of the left central line projecting over the distal brachiocephalic vein, unchanged. Right dialysis catheter in place. Improving left basilar aeration with decreasing hazy opacity. Decreasing right infrahilar atelectasis. No pneumothorax. Cardiomediastinal contours are unchanged. No pulmonary edema. IMPRESSION: 1. Improving bibasilar aeration. 2. Support apparatus unchanged. Electronically Signed   By: MelJeb LeveringD.   On: 05/19/2015 03:12   Dg Chest Port 1 View  04/29/2015  CLINICAL DATA:  Central line placement. EXAM: PORTABLE CHEST 1 VIEW COMPARISON:  Earlier this day at [id number] FINDINGS: New left internal jugular central venous catheter, tip projects over the region of the distal brachiocephalic vein. No pneumothorax. Endotracheal tube is now 2.2 cm from the carina. Enteric tube in place. A right-sided dialysis catheter remains in place. Developing hazy opacity at the left lung base, may be combination of pleural effusion and  atelectasis. There is increasing right infrahilar atelectasis. No frank pulmonary edema. Cardiomediastinal contours are unchanged. IMPRESSION: 1. Tip of the left internal jugular central venous catheter projects over the region the distal brachiocephalic vein. No pneumothorax. 2. Endotracheal tube is now 2.2 cm from the carina, and appears advanced from prior. 3. Increasing left lung base opacity, likely combination of atelectasis and developing pleural effusion. Increasing right infrahilar atelectasis. Electronically Signed   By: MelJeb Levering  M.D.   On: 05/01/2015 22:25   Dg Chest Port 1 View  05/17/2015  CLINICAL DATA:  Unresponsive at dialysis today. Sleep apnea. Diabetes. COPD. EXAM: PORTABLE CHEST 1 VIEW COMPARISON:  03/29/2015 FINDINGS: Right IJ dialysis catheter tip: Cavoatrial junction. Endotracheal tube tip: 5 cm above the carina. Nasogastric tube enters the stomach. Atherosclerotic aortic arch. No cardiomegaly. Thoracic spondylosis. Bony demineralization. The lungs appear clear. IMPRESSION: 1. No edema or cardiomegaly. Support apparatus satisfactorily position. 2. Bony demineralization. 3. Atherosclerosis. Electronically Signed   By: Van Clines M.D.   On: 05/15/2015 18:13      Indwelling Urinary Catheter continued, requirement due to   Reason to continue Indwelling Urinary Catheter for strict Intake/Output monitoring for hemodynamic instability   Central Line continued, requirement due to   Reason to continue [employer] Monitoring of central venous pressure or other hemodynamic parameters   Ventilator continued, requirement due to, resp failure    Ventilator Sedation RASS 0 to -2   Cultures: BCx2  UC  Sputum >>  Antibiotics: Vanc 4/20>> Zosyn 4/20>>  Lines:   ASSESSMENT/PLAN  80 year old male past medical history of CVA, end-stage renal disease on dialysis, Chorea, admitted for unresponsiveness and lethargy, intubated in the ER. Patient with signs of  aspiration pneumonia  PULMONARY VDRF Lethargy ?Sepsis Unreponsive ? Another CVA COPD P:   -MV,wean as tolerated - SBT/WUA as tolerated - fentanyl gtt/versed prn - follow up ABG - f/u CT head   CARDIOVASCULAR Elevated troponin -pressors as needed -maintain MAP >65 -check ECHO  RENAL ESRD on HD hypokalemia P:   - ICU electrolyte replacement protocol - nephro consulted -HD as needed  GASTROINTESTINAL P:   - PPI -start TF's  HEMATOLOGIC AOCD P:  -f/u CBC -DVT Prop - SCD, -CT head pending.   INFECTIOUS Sepsis - source unknown P:   -empiric abx as stated above  ENDOCRINE DM 2 P:   - ICU hyperglycemic protocol  NEUROLOGIC Hx of CVA - ishemic and hemorrhagic transformation  ? Another CVA RASS goal: 0 -f/u head CT - hold plavix and asa for now.   I have personally obtained a history, examined the patient, evaluated laboratory and imaging results, formulated the assessment and plan and placed orders.  The Patient requires high complexity decision making for assessment and support, frequent evaluation and titration of therapies, application of advanced monitoring technologies and extensive interpretation of multiple databases. Critical Care Time devoted to patient care services described in this note is 35 minutes.   Overall, patient is critically ill, prognosis is guarded. Patient at high risk for cardiac arrest and death.    Corrin Parker, M.D.  [facility]  Medical Director [place] Director [facility] Cardio-Pulmonary Department

## 2015-05-19 NOTE — Progress Notes (Signed)
LCSW reviewed patient information and will assess tomorrow  Enis Slipper LCSW [phone]

## 2015-05-19 NOTE — Progress Notes (Signed)
Clarified with Suzy Bouchard, RD that patient would still get maintenance flush per protocol (20 mL every 4 hours since dialysis patient) with no free water orders for patient.

## 2015-05-19 NOTE — Progress Notes (Signed)
[facility]  ROUNDING NOTE   Subjective:   Admitted yesterday after dialysis. Found unresponsive. Given sedative before hemodialysis treatment? HD outpatient nurse states he was lethargic on treatment yesterday but alert times three.   Objective:  Vital signs in last 24 hours:  Temp:  [97.1 F (36.2 C)-99.5 F (37.5 C)] 99.3 F (37.4 C) (04/21 0600) Pulse Rate:  [63-98] 83 (04/21 0900) Resp:  [14-28] 18 (04/21 0900) BP: (90-156)/(37-92) 108/43 mmHg (04/21 0900) SpO2:  [84 %-100 %] 99 % (04/21 0900) FiO2 (%):  [40 %] 40 % (04/21 0900) Weight:  [58 kg (127 lb 13.9 oz)-60.873 kg (134 lb 3.2 oz)] 58 kg (127 lb 13.9 oz) (04/21 0234)  Weight change:  Filed Weights   05/09/2015 1715 05/24/2015 2235 05/19/15 0234  Weight: 60.873 kg (134 lb 3.2 oz) 58 kg (127 lb 13.9 oz) 58 kg (127 lb 13.9 oz)    Intake/Output: I/O last 3 completed shifts: In: 2151.3 [I.V.:2101.3; IV Piggyback:50] Out: 200 [Urine:200]   Intake/Output this shift:  Total I/O In: -  Out: 8 [Urine:8]  Physical Exam: General: Critically ill  Head: +ETT  Eyes: Eyes closed  Neck: Supple, trachea midline  Lungs:  Course bilaterally, PRVC 40%  Heart: Regular rate and rhythm  Abdomen:  Soft, nontender  Extremities: no peripheral edema.  Neurologic: Intubated, sedated  Skin: No lesions  Access: RIJ permcath    Basic Metabolic Panel:  Recent Labs Lab 05/04/2015 1726 05/27/2015 2225 05/19/15 0437  NA 137 137 139  K 3.2* 3.2* 3.7  CL 97* 102 103  CO2 26 27 27   GLUCOSE 144* 190* 160*  BUN 16 24* 33*  CREATININE 1.68* 1.76* 1.85*  CALCIUM 10.0 9.6 10.1  MG  --   --  1.7    Liver Function Tests:  Recent Labs Lab 04/29/2015 1726  AST 50*  ALT 27  ALKPHOS 132*  BILITOT 0.5  PROT 6.3*  ALBUMIN 2.4*   No results for input(s): LIPASE, AMYLASE in the last 168 hours. No results for input(s): AMMONIA in the last 168 hours.  CBC:  Recent Labs Lab 04/30/2015 1726 05/19/15 0437  WBC 35.1* 26.3*   NEUTROABS 33.0*  --   HGB 9.5* 8.1*  HCT 29.2* 24.7*  MCV 89.6 88.6  PLT 329 262    Cardiac Enzymes:  Recent Labs Lab 05/17/2015 1726 05/07/2015 2225 05/19/15 0233  CKMB  --  10.0* 12.9*  TROPONINI 0.25* 0.07* 0.17*    BNP: Invalid input(s): POCBNP  CBG:  Recent Labs Lab 05/19/15 0018 05/19/15 0431 05/19/15 0750  GLUCAP 168* 150* 136*    Microbiology: Results for orders placed or performed during the hospital encounter of 05/06/2015  Urine culture     Status: None (Preliminary result)   Collection Time: 05/24/2015  5:26 PM  Result Value Ref Range Status   Specimen Description URINE, RANDOM  Final   Special Requests NONE  Final   Culture TOO YOUNG TO READ  Final   Report Status PENDING  Incomplete  Blood Culture (routine x 2)     Status: None (Preliminary result)   Collection Time: 05/21/2015  6:13 PM  Result Value Ref Range Status   Specimen Description BLOOD LEFT ARM  Final   Special Requests BOTTLES DRAWN AEROBIC AND ANAEROBIC [place]  Final   Culture NO GROWTH < 24 HOURS  Final   Report Status PENDING  Incomplete  Blood Culture (routine x 2)     Status: None (Preliminary result)   Collection Time:  05/01/2015  6:13 PM  Result Value Ref Range Status   Specimen Description BLOOD RIGHT ASSIST CONTROL  Final   Special Requests BOTTLES DRAWN AEROBIC AND ANAEROBIC 8CC  Final   Culture NO GROWTH < 12 HOURS  Final   Report Status PENDING  Incomplete  MRSA PCR Screening     Status: None   Collection Time: 05/27/2015 10:29 PM  Result Value Ref Range Status   MRSA by PCR NEGATIVE NEGATIVE Final    Comment:        The GeneXpert MRSA Assay (FDA approved for NASAL specimens only), is one component of a comprehensive MRSA colonization surveillance program. It is not intended to diagnose MRSA infection nor to guide or monitor treatment for MRSA infections.     Coagulation Studies:  Recent Labs  05/26/2015 1726  LABPROT 13.0  INR 0.96    Urinalysis:  Recent Labs   04/29/2015 1726  COLORURINE YELLOW*  LABSPEC 1.014  PHURINE 5.0  GLUCOSEU NEGATIVE  HGBUR 1+*  BILIRUBINUR NEGATIVE  KETONESUR NEGATIVE  PROTEINUR 100*  NITRITE NEGATIVE  LEUKOCYTESUR 1+*      Imaging: Ct Head Wo Contrast  05/01/2015  CLINICAL DATA:  Patient found unresponsive today. No known injury. Initial encounter. EXAM: CT HEAD WITHOUT CONTRAST TECHNIQUE: Contiguous axial images were obtained from the base of the skull through the vertex without intravenous contrast. COMPARISON:  Brain MRI 04/15/2015.  Head CT scan 04/13/2015. FINDINGS: Atrophy and extensive chronic microvascular ischemic change are again seen. No evidence of acute intracranial abnormality including hemorrhage, infarct, mass lesion, mass effect, midline shift or abnormal extra-axial fluid collection is seen. Scattered ethmoid air cell disease identified. Mucosal thickening is seen in the right maxillary sinus and unchanged. The calvarium is intact. IMPRESSION: No acute abnormality. Atrophy and extensive chronic microvascular ischemic change. Electronically Signed   By: Inge Rise M.D.   On: 05/01/2015 19:30   Dg Chest Port 1 View  05/19/2015  CLINICAL DATA:  Acute respiratory failure. EXAM: PORTABLE CHEST 1 VIEW COMPARISON:  Yesterday at 2202 hours FINDINGS: Endotracheal tube 3.2 cm from the carina. Enteric tube in place. Tip of the left central line projecting over the distal brachiocephalic vein, unchanged. Right dialysis catheter in place. Improving left basilar aeration with decreasing hazy opacity. Decreasing right infrahilar atelectasis. No pneumothorax. Cardiomediastinal contours are unchanged. No pulmonary edema. IMPRESSION: 1. Improving bibasilar aeration. 2. Support apparatus unchanged. Electronically Signed   By: Jeb Levering M.D.   On: 05/19/2015 03:12   Dg Chest Port 1 View  05/19/2015  CLINICAL DATA:  Central line placement. EXAM: PORTABLE CHEST 1 VIEW COMPARISON:  Earlier this day at 38 hour  FINDINGS: New left internal jugular central venous catheter, tip projects over the region of the distal brachiocephalic vein. No pneumothorax. Endotracheal tube is now 2.2 cm from the carina. Enteric tube in place. A right-sided dialysis catheter remains in place. Developing hazy opacity at the left lung base, may be combination of pleural effusion and atelectasis. There is increasing right infrahilar atelectasis. No frank pulmonary edema. Cardiomediastinal contours are unchanged. IMPRESSION: 1. Tip of the left internal jugular central venous catheter projects over the region the distal brachiocephalic vein. No pneumothorax. 2. Endotracheal tube is now 2.2 cm from the carina, and appears advanced from prior. 3. Increasing left lung base opacity, likely combination of atelectasis and developing pleural effusion. Increasing right infrahilar atelectasis. Electronically Signed   By: Jeb Levering M.D.   On: 05/17/2015 22:25   Dg Chest [facility] [address]  05/23/2015  CLINICAL DATA:  Unresponsive at dialysis today. Sleep apnea. Diabetes. COPD. EXAM: PORTABLE CHEST 1 VIEW COMPARISON:  03/29/2015 FINDINGS: Right IJ dialysis catheter tip: Cavoatrial junction. Endotracheal tube tip: 5 cm above the carina. Nasogastric tube enters the stomach. Atherosclerotic aortic arch. No cardiomegaly. Thoracic spondylosis. Bony demineralization. The lungs appear clear. IMPRESSION: 1. No edema or cardiomegaly. Support apparatus satisfactorily position. 2. Bony demineralization. 3. Atherosclerosis. Electronically Signed   By: Van Clines M.D.   On: 05/23/2015 18:13     Medications:   . fentaNYL infusion INTRAVENOUS Stopped (05/19/15 0900)   . etomidate  20 mg Intravenous Once  . insulin aspart  2-6 Units Subcutaneous Q4H  . pantoprazole  40 mg Oral Daily  . piperacillin-tazobactam (ZOSYN)  IV  3.375 g Intravenous Q8H  . senna  1 tablet Oral Daily  . vancomycin  750 mg Intravenous Q24H   sodium chloride, fentaNYL,  hydrALAZINE, metoprolol, midazolam, midazolam  Assessment/ Plan:  Jason Aguilar is a 80 y.o. white male 80 y.o. 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   1. ESRD: TTS through [place]. Did hemodialysis yesterday. No acute indication for dialysis at this time.  - Evaluated daily for dialysis, plan on next treatment tomorrow.   2. Anemia of chronic kidney disease:  Hemoglobin 8.1 - epo with treatment  3. Secondary Hyperparathyroidism: with hypercalcemia - he is on calcium acetate for binding.   4. Diabetes Mellitus type II: with chronic kidney disease: hemoglobin A1c of 11.2% on 03/29/2015.   5. Hypertension: difficult to control in past.  - well controlled currently. Not currently on vasopressors.    LOS: [place], [place] 4/21/20179:51 AM

## 2015-05-19 NOTE — Consult Note (Signed)
[place]  CARDIOLOGY CONSULT NOTE  Patient ID: Jason Aguilar MRN: [id number] DOB/AGE: 1935-05-14 80 y.o.  Admit date: 05/14/2015 Referring Physician Dr. Stevenson Clinch Primary Physician   Primary Cardiologist   Reason for Consultation elevated troponin  HPI:  Pt is a 80 yo male with history of  end-stage renal disease on dialysis, recent CVA, hematuria syndrome, GERD,hyperlipidemia, OSA on CPAP, bladder cell carcinoma, diabetes type 2, who was noted to be unresponsive postHD. Pt was recently admitted at [place] with a cva with right sided weakness. [facility]. He had been noted to have gradually decreasing mental capacity and confusion. H left hd on day of admission and on arrival to his rehab facility, was noted to be unresponsive. He was brought to the er where he was intubated for airway protection. He had a mild serum troponin elevation to 0.17. EKG revealed nsr with nonspecific st t wave changes. He is currently intubated and not able to give history. Had normal lv functiion by echo last month.    Review of Systems  Unable to perform ROS: intubated    Past Medical History  Diagnosis Date  . Glaucoma     both eyes Drs. Arrie Eastern and [place]  . Hematuria syndrome     s/p negative cystoscopy  . GERD (gastroesophageal reflux disease) 2003    EGD  . Allergy   . Hyperlipidemia   . Obstructive sleep apnea of adult     CPAP at 8 cm H20  . Noncompliance with medication regimen   . 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 by [facility]: low grade noninvasive  . Chronic kidney disease (CKD), stage III (moderate)     with proteinuria  . Glaucoma   . Hypertension   . Diabetes mellitus     Type 2  . Cataract due to secondary diabetes mellitus ([place])   . COPD (chronic obstructive pulmonary disease) ([place])     secondary to tobacco abuse  . History of tobacco abuse     60 pack year history; quit 1980    Family History   Problem Relation Age of Onset  . Diabetes Mother   . Heart disease Father     Social History   Social History  . Marital Status: Married    Spouse Name: N/A  . Number of Children: N/A  . Years of Education: N/A   Occupational History  . Not on file.   Social History Main Topics  . Smoking status: Former Smoker    Quit date: 10/18/1980  . Smokeless tobacco: Never Used  . Alcohol Use: 1.8 oz/week    3 Cans of beer per week  . Drug Use: No  . Sexual Activity: Not on file   Other Topics Concern  . Not on file   Social History Narrative    Past Surgical History  Procedure Laterality Date  . Elbow surgery    . Transurethral resection of bladder tumor  Mar 13 2011    [facility]  . Peripheral vascular catheterization N/A 01/03/2015    Procedure: Dialysis/Perma Catheter Insertion;  Surgeon: Katha Cabal, MD;  Location: [place] CV LAB;  Service: Cardiovascular;  Laterality: N/A;  . Skin graft    . Cataract extraction       Prescriptions prior to admission  Medication Sig Dispense Refill Last Dose  . amLODipine (NORVASC) 5 MG tablet Take 5 mg by mouth daily.   04/13/2015 at  AM  . aspirin 81 MG chewable tablet Chew 1 tablet (81 mg total) by mouth daily. 30 tablet 0 04/13/2015 at AM  . calcium acetate (PHOSLO) 667 MG capsule Take 2 capsules (1,334 mg total) by mouth 3 (three) times daily with meals. 180 capsule 2 04/13/2015 at Unknown time  . cloNIDine (CATAPRES) 0.1 MG tablet Take 1 tablet (0.1 mg total) by mouth 2 (two) times daily. 60 tablet 2 04/13/2015 at Unknown time  . clopidogrel (PLAVIX) 75 MG tablet Take 1 tablet (75 mg total) by mouth daily. 30 tablet 0 04/13/2015 at Unknown time  . doxazosin (CARDURA) 8 MG tablet Take 8 mg by mouth at bedtime.    04/13/2015 at Unknown time  . hydrALAZINE (APRESOLINE) 100 MG tablet Take 100 mg by mouth 3 (three) times daily.   04/13/2015 at Unknown time  . insulin aspart (NOVOLOG) 100 UNIT/ML injection Inject 2 Units into the skin 3  (three) times daily before meals. 10 mL 11   . insulin detemir (LEVEMIR) 100 UNIT/ML injection Inject 0.15 mLs (15 Units total) into the skin daily. 10 mL 11   . metoprolol (LOPRESSOR) 50 MG tablet Take 50 mg by mouth 2 (two) times daily.     04/13/2015 at Unknown time  . simvastatin (ZOCOR) 40 MG tablet Take 40 mg by mouth at bedtime.   04/13/2015 at Unknown time  . sodium bicarbonate 650 MG tablet Take 650 mg by mouth 2 (two) times daily.   04/13/2015 at Unknown time    Physical Exam: Blood pressure 104/54, pulse 78, temperature 97.5 F (36.4 C), temperature source Axillary, resp. rate 14, height 5\' 4"  (1.626 m), weight 58 kg (127 lb 13.9 oz), SpO2 99 %.   Wt Readings from Last 1 Encounters:  05/19/15 58 kg (127 lb 13.9 oz)     General appearance: intubated Resp: diminished breath sounds bilaterally Cardio: regular rate and rhythm Extremities: extremities normal, atraumatic, no cyanosis or edema Neurologic: Grossly normal  Labs:   Lab Results  Component Value Date   WBC 26.3* 05/19/2015   HGB 8.1* 05/19/2015   HCT 24.7* 05/19/2015   MCV 88.6 05/19/2015   PLT 262 05/19/2015    Recent Labs Lab 05/07/2015 1726  05/19/15 0437  NA 137  < > 139  K 3.2*  < > 3.7  CL 97*  < > 103  CO2 26  < > 27  BUN 16  < > 33*  CREATININE 1.68*  < > 1.85*  CALCIUM 10.0  < > 10.1  PROT 6.3*  --   --   BILITOT 0.5  --   --   ALKPHOS 132*  --   --   ALT 27  --   --   AST 50*  --   --   GLUCOSE 144*  < > 160*  < > = values in this interval not displayed. Lab Results  Component Value Date   CKTOTAL 63 09/12/2011   CKMB 1.3 05/19/2015   TROPONINI 0.06* 05/19/2015      Radiology: Head ct shows aatrophy and microvascular ischemic changes. No acute intracranial abnormality. CXR showed no pulmonary edema.  EKG: nsr  ASSESSMENT AND PLAN:  Pt with history of esrd on hd, history of recent cva found to be unresponsive post hd. Mild troponin elevaation. Will review repeat echo when avaialble. Cont  vent wean. Was hypokalemic on admission which suggests possible arrhythmia as etiology of becoming unresopnsive. Unclear why he was more lethargic recently. Does not appear to  have had an acute coronary event. Will review echo and follow symtpoms and hemodynamics post extubation. Repleat K.    Beta blockers as hemodynamics tolerate Teodoro Spray MD, [facility] 05/19/2015, 4:15 PM

## 2015-05-19 NOTE — Consult Note (Addendum)
Pharmacy Antibiotic Note  Jason Aguilar is a 80 y.o. male admitted on 05/04/2015 with sepsis.  Pharmacy has been consulted for vancomycin and zosyn dosing.  Plan: Patient is a HD patient Tues, Thurs, and Sat schedule. Will change Zosyn to q12 hours dosing and will dose Vancomycin per HD dosing protocol.   Will give Vancomycin 500 mg IV qHD session. Trough order the day of the 3rd dialysis session.   Zosyn: Zosyn 3.375 g IV q12 hours.    Height: 5\' 4"  (162.6 cm) Weight: 127 lb 13.9 oz (58 kg) IBW/kg (Calculated) : 59.2  Temp (24hrs), Avg:98.8 F (37.1 C), Min:97.1 F (36.2 C), Max:99.5 F (37.5 C)   Recent Labs Lab 05/07/2015 1726 05/26/2015 2225 05/19/15 0437  WBC 35.1*  --  26.3*  CREATININE 1.68* 1.76* 1.85*  LATICACIDVEN 3.5* 1.7  --     Estimated Creatinine Clearance: 27.4 mL/min (by C-G formula based on Cr of 1.85).    Allergies  Allergen Reactions  . Losartan Swelling  . Dristan Rash    Antimicrobials this admission: vancomycin 4/20 >>  zosyn 4/20 >>   Dose adjustments this admission:  Zosyn 3.375 g IV q12 hours 4/21.  Microbiology results: 4/20 BCx: pending 4/20 UCx: pending  4/20 Sputum: needs to be collected    Thank you for allowing pharmacy to be a part of this patient's care. Larene Beach, PharmD  Clinical Pharmacist 05/19/2015 8:12 AM

## 2015-05-19 NOTE — Progress Notes (Signed)
Chaplain rounded the unit and provided a compassionate presence and spiritual support to the patient.  Chaplain Yomar Mejorado [phone] 

## 2015-05-19 NOTE — Progress Notes (Signed)
Initial Nutrition Assessment  DOCUMENTATION CODES:   Not applicable  INTERVENTION:  -Received verbal order from MD Mungal to start TF: recommend starting Vital High Protein at rate of 20 ml/hr with goal of 65 ml/hr via Adult Tube Feeding Protocol. Provides 1560 kcals, 137 g of protein, 1310 mL of fluid. Meets 100% of calorie needs, >100% of protein per ASPEN guidelines. Continue to assess  NUTRITION DIAGNOSIS:   Inadequate oral intake related to acute illness as evidenced by NPO status.  GOAL:   Provide needs based on ASPEN/SCCM guidelines  MONITOR:   TF tolerance, Vent status, Labs, I & O's, Weight trends  REASON FOR ASSESSMENT:   Consult, Ventilator Enteral/tube feeding initiation and management  ASSESSMENT:   80 yo male admitted with lethargy, unable to clear secretions, intubated for airway protection on 05/26/2015; possible aspiration pneumonia, possible new CVA; pt had recent CVA with dysphagia. Pt also with hx of ESRD on HD   Pt with possible new CVA, possible aspiration pneumonia  Unable to complete Nutrition-Focused physical exam at this time.   Patient is currently intubated on ventilator support MV: 11.5 L/min Temp (24hrs), Avg:98.9 F (37.2 C), Min:97.1 F (36.2 C), Max:99.9 F (37.7 C)  Past Medical History  Diagnosis Date  . Glaucoma     both eyes Drs. Arrie Eastern and [place]  . Hematuria syndrome     s/p negative cystoscopy  . GERD (gastroesophageal reflux disease) 2003    EGD  . Allergy   . Hyperlipidemia   . Obstructive sleep apnea of adult     CPAP at 8 cm H20  . Noncompliance with medication regimen   . 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 by [facility]: low grade noninvasive  . Chronic kidney disease (CKD), stage III (moderate)     with proteinuria  . Glaucoma   . Hypertension   . Diabetes mellitus     Type 2  . Cataract due to secondary diabetes mellitus ([place])   . COPD (chronic obstructive pulmonary disease) ([place])    secondary to tobacco abuse  . History of tobacco abuse     60 pack year history; quit 1980   Diet Order:  Diet NPO time specified  Skin:  Reviewed, no issues  Last BM:  05/19/15   Digestive System: abdomen soft, BS present, +BM, OG in place  Labs: Creatinine 1.85  Meds: ss novolog, senokot  Height:   Ht Readings from Last 1 Encounters:  05/10/2015 5\' 4"  (1.626 m)    Weight: weight trending down per weight encounters  Wt Readings from Last 1 Encounters:  05/19/15 127 lb 13.9 oz (58 kg)    Wt Readings from Last 10 Encounters:  05/19/15 127 lb 13.9 oz (58 kg)  04/20/15 131 lb 9.8 oz (59.7 kg)  04/15/15 133 lb (60.328 kg)  04/04/15 138 lb 14.2 oz (63 kg)  03/28/15 150 lb (68.04 kg)  03/03/15 143 lb (64.864 kg)  01/06/15 169 lb 8 oz (76.885 kg)  09/02/11 169 lb 8 oz (76.885 kg)  08/23/11 164 lb (74.39 kg)  07/04/11 161 lb (73.029 kg)    BMI:  Body mass index is 21.94 kg/(m^2).  Estimated Nutritional Needs:   Kcal:  1603 kcals  Protein:  87-170 g  Fluid:  >/= 1.6 L  EDUCATION NEEDS:   No education needs identified at this time  [place], [place], [place] [phone] Pager  [phone] Weekend/On-Call Pager

## 2015-05-19 NOTE — Progress Notes (Signed)
Inpatient Diabetes Program Recommendations  AACE/ADA: New Consensus Statement on Inpatient Glycemic Control (2015)  Target Ranges:  Prepandial:   less than 140 mg/dL      Peak postprandial:   less than 180 mg/dL (1-2 hours)      Critically ill patients:  140 - 180 mg/dL   Review of Glycemic Control  Results for GARRELL, LAVERS (MRN [id number]) as of 05/19/2015 12:08  Ref. Range 05/19/2015 00:18 05/19/2015 04:31 05/19/2015 07:50 05/19/2015 11:43  Glucose-Capillary Latest Ref Range: 65-99 mg/dL 168 (H) 150 (H) 136 (H) 165 (H)    Diabetes history: DM2 Outpatient Diabetes medications: Levemir 15 units daily + Novolog 2 units tid ac Last admission to the hospital on 04/20/15-  Levemir 15 units q d + Novolog correction sensitive 0-9 qid, Novolog 2 units tid with meals  Current orders Novolog 2-6 units q4h.  Patient NPO.   Inpatient Diabetes Program Recommendations: Agree with current orders for blood sugar management.  Please ensure that Novolog insulin is given at least 4 hours apart. Patient has renal failure and may not excrete insulin readily causing hypoglycemia if excess amounts are given or if given too close together.   Gentry Fitz, RN, BA, MHA, CDE Diabetes Coordinator Inpatient Diabetes Program  [phone] (Team Pager) [phone] ([place]) 05/19/2015 12:09 PM

## 2015-05-20 ENCOUNTER — Inpatient Hospital Stay: Payer: Medicare Other

## 2015-05-20 LAB — BASIC METABOLIC PANEL
ANION GAP: 29 — AB (ref 5–15)
Anion gap: 11 (ref 5–15)
BUN: 72 mg/dL — AB (ref 6–20)
BUN: 34 mg/dL — ABNORMAL HIGH (ref 6–20)
CO2: 26 mmol/L (ref 22–32)
CO2: 16 mmol/L — AB (ref 22–32)
CREATININE: 2.49 mg/dL — AB (ref 0.61–1.24)
Calcium: 10.3 mg/dL (ref 8.9–10.3)
Calcium: 9.7 mg/dL (ref 8.9–10.3)
Chloride: 102 mmol/L (ref 101–111)
Chloride: 100 mmol/L — ABNORMAL LOW (ref 101–111)
Creatinine, Ser: 1.54 mg/dL — ABNORMAL HIGH (ref 0.61–1.24)
GFR calc Af Amer: 27 mL/min — ABNORMAL LOW (ref >60)
GFR calc Af Amer: 48 mL/min — ABNORMAL LOW (ref >60)
GFR calc non Af Amer: 42 mL/min — ABNORMAL LOW (ref >60)
GFR, EST NON AFRICAN AMERICAN: 23 mL/min — AB (ref >60)
GLUCOSE: 151 mg/dL — AB (ref 65–99)
GLUCOSE: 213 mg/dL — AB (ref 65–99)
POTASSIUM: 3.2 mmol/L — AB (ref 3.5–5.1)
Potassium: 4.1 mmol/L (ref 3.5–5.1)
SODIUM: 139 mmol/L (ref 135–145)
Sodium: 145 mmol/L (ref 135–145)

## 2015-05-20 LAB — CBC
HCT: 23.6 % — ABNORMAL LOW (ref 40.0–52.0)
Hemoglobin: 7.5 g/dL — ABNORMAL LOW (ref 13.0–18.0)
MCH: 29.3 pg (ref 26.0–34.0)
MCHC: 31.9 g/dL — AB (ref 32.0–36.0)
MCV: 91.8 fL (ref 80.0–100.0)
PLATELETS: 223 10*3/uL (ref 150–440)
RBC: 2.57 MIL/uL — ABNORMAL LOW (ref 4.40–5.90)
RDW: 14.4 % (ref 11.5–14.5)
WBC: 24.1 10*3/uL — ABNORMAL HIGH (ref 3.8–10.6)

## 2015-05-20 LAB — MAGNESIUM: Magnesium: 2.1 mg/dL (ref 1.7–2.4)

## 2015-05-20 LAB — PHOSPHORUS: PHOSPHORUS: 7.7 mg/dL — AB (ref 2.5–4.6)

## 2015-05-20 LAB — GLUCOSE, CAPILLARY
GLUCOSE-CAPILLARY: 176 mg/dL — AB (ref 65–99)
GLUCOSE-CAPILLARY: 179 mg/dL — AB (ref 65–99)
Glucose-Capillary: 119 mg/dL — ABNORMAL HIGH (ref 65–99)
Glucose-Capillary: 204 mg/dL — ABNORMAL HIGH (ref 65–99)
Glucose-Capillary: 182 mg/dL — ABNORMAL HIGH (ref 65–99)
Glucose-Capillary: 134 mg/dL — ABNORMAL HIGH (ref 65–99)
Glucose-Capillary: 136 mg/dL — ABNORMAL HIGH (ref 65–99)

## 2015-05-20 LAB — PROCALCITONIN: PROCALCITONIN: 8.02 ng/mL

## 2015-05-20 LAB — PROTIME-INR
INR: 1.23
PROTHROMBIN TIME: 15.7 s — AB (ref 11.4–15.0)

## 2015-05-20 LAB — APTT: APTT: 45 s — AB (ref 24–36)

## 2015-05-20 MED ORDER — PANTOPRAZOLE SODIUM 40 MG IV SOLR
40.0000 mg | INTRAVENOUS | Status: DC
  Administered 2015-05-20 – 2015-05-23 (×4): 40 mg via INTRAVENOUS
  Filled 2015-05-20 (×5): qty 40

## 2015-05-20 MED ORDER — NOREPINEPHRINE 4 MG/250ML-% IV SOLN
INTRAVENOUS | Status: AC
  Administered 2015-05-20: 4 mg
  Filled 2015-05-20: qty 250

## 2015-05-20 MED ORDER — SODIUM BICARBONATE 8.4 % IV SOLN
INTRAVENOUS | Status: AC
  Filled 2015-05-20: qty 100

## 2015-05-20 MED ORDER — VASOPRESSIN 20 UNIT/ML IV SOLN
0.0300 [IU]/min | INTRAVENOUS | Status: DC
  Administered 2015-05-20: 0.03 [IU]/min via INTRAVENOUS
  Filled 2015-05-20: qty 2

## 2015-05-20 MED ORDER — AMIODARONE HCL IN DEXTROSE 360-4.14 MG/200ML-% IV SOLN
60.0000 mg/h | INTRAVENOUS | Status: AC
  Filled 2015-05-20: qty 200

## 2015-05-20 MED ORDER — NOREPINEPHRINE BITARTRATE 1 MG/ML IV SOLN
0.0000 ug/min | INTRAVENOUS | Status: DC
  Administered 2015-05-20: 30 ug/min via INTRAVENOUS
  Filled 2015-05-20: qty 16

## 2015-05-20 MED ORDER — AMIODARONE HCL IN DEXTROSE 360-4.14 MG/200ML-% IV SOLN: 30.0000 mg/h | INTRAVENOUS | Status: DC

## 2015-05-20 NOTE — Progress Notes (Signed)
LCSW met with patient in his room and is unable to assess as patient has vent airflow and not responsive. Will assess tomorrow.  [employer] LCSW [phone]

## 2015-05-20 NOTE — Progress Notes (Signed)
PATIENT NAME: [place] RECORD NUMBER: [id number] Birthday: August 28, 1935  Age: 80 y.o. Admit Date: 05/15/2015  Provider: Mortimer Fries  Indication: cardiac arrest  Technical Description:acute Bradyarrthymia-->PEA>Blood pressure 102/43, pulse 88, temperature 98.5 F (36.9 C), temperature source Axillary, resp. rate 19, height 5\' 4"  (1.626 m), weight 125 lb 14.1 oz (57.1 kg), SpO2 100 %. fib   CPR performance duration: 8 minutes  Was defibrillation or cardioversion used ? yes  Was external pacer placed ? yes    Medications Administered Include      Yes/no Amiodarone   Atropin   Calcium yes  Epinephrine yes  Lidocaine yes  Magnesium   Norepinephrine   Phenylephrine   Sodium bicarbonate yes  Vasopression    Evaluation  Final Status - Was patient successfully resuscitated ?yes  If successfully resuscitated - what is current rhythm ?ST If successfully resuscitated - what is current hemodynamic status ? unstable  Miscellaneous Information Family updated-wife and daughter  Flora Lipps 4/22/20179:47 PM   I have updated family to come to ICU, patient at high risk for recurrent cardiac arrest  Corrin Parker, M.D.  [facility]  Medical Director [place] Director [facility] Cardio-Pulmonary Department

## 2015-05-20 NOTE — Progress Notes (Signed)
PULMONARY / CRITICAL CARE MEDICINE   Name: Jason Aguilar MRN: [id number] DOB: 1935-08-25    ADMISSION DATE:  05/12/2015  CHIEF COMPLAINT:   Unresponsive, lethargic, intuated  HISTORY OF PRESENT ILLNESS:   80 year old male past medical history of end-stage renal disease on dialysis, recent CVA, hematuria syndrome, GERD,hyperlipidemia, OSA on CPAP, bladder cell carcinoma, diabetes type 2, minute from the ER after being found unresponsive after dialysis with hypoxia. History per wife at bedside and chart review. Y states that patient had a recent admission to [facility] little over a month ago, he was diagnosed with a stroke, his main neurological deficits were some right-sided tingling, and dysphagia, [facility]. Over the last week he has had depressed mentation, increased confusion, and lethargy. Today he had his dialysis session, and after being picked up and brought back to [facility] he was noted to be lethargic and unresponsive to sternal rub. EMS was called patient was brought to the ER he was noted to have saturation of 84% and not responding to verbal stimuli, not able to clear his secretions, he was subsequently intubated for airway protection. CCM was called for further admission and management.  SUBJECTIVE:  No acute issues overnight. Remains on full vent support and off pressors  VITAL SIGNS: BP 105/43 mmHg  Pulse 65  Temp(Src) 98.2 F (36.8 C) (Axillary)  Resp 16  Ht 5\' 4"  (1.626 m)  Wt 127 lb 13.9 oz (58 kg)  BMI 21.94 kg/m2  SpO2 100%  HEMODYNAMICS:    VENTILATOR SETTINGS: Vent Mode:  [-] PRVC FiO2 (%):  [30 %-40 %] 30 % Set Rate:  [14 bmp-18 bmp] 14 bmp Vt Set:  [500 mL] 500 mL PEEP:  [5 cmH20] 5 cmH20  INTAKE / OUTPUT: I/O last 3 completed shifts: In: 2400.5 [I.V.:2294.8; NG/GT:55.7; IV Piggyback:50] Out: 208 [Urine:208]  Physical Exam  Constitutional: He appears well-developed and  well-nourished.  HENT:  Head: Normocephalic and atraumatic.  Right Ear: External ear normal.  Left Ear: External ear normal.  Mouth/Throat: Oropharynx is clear and moist.  Eyes:  Sluggish pupils  Neck: Neck supple. No thyromegaly present.  Cardiovascular: Normal rate, regular rhythm, normal heart sounds and intact distal pulses.  No murmur heard. Pulmonary/Chest: He is in respiratory distress. He has no wheezes. He has no rales. He exhibits no tenderness.  On MV, coarse upper airway sounds, no wheezes  Abdominal: Soft. Bowel sounds are normal. He exhibits no distension.  Genitourinary: Penis normal.  Neurological:  Sedated, gcs<8T  Skin: Skin is warm and dry.   LABS:  BMET  Recent Labs Lab 05/20/2015 2225 05/19/15 0437 05/20/15 0503  NA 137 139 139  K 3.2* 3.7 4.1  CL 102 103 102  CO2 27 27 26   BUN 24* 33* 72*  CREATININE 1.76* 1.85* 2.49*  GLUCOSE 190* 160* 151*    Electrolytes  Recent Labs Lab 05/05/2015 2225 05/19/15 0437 05/20/15 0503  CALCIUM 9.6 10.1 10.3  MG  --  1.7  --     CBC  Recent Labs Lab 05/17/2015 1726 05/19/15 0437 05/20/15 0503  WBC 35.1* 26.3* 24.1*  HGB 9.5* 8.1* 7.5*  HCT 29.2* 24.7* 23.6*  PLT 329 262 223    Coag's  Recent Labs Lab 05/05/2015 1726  APTT 31  INR 0.96    Sepsis Markers  Recent Labs Lab 05/24/2015 1726 05/21/2015 2225 05/19/15 0437  LATICACIDVEN 3.5* 1.7  --   PROCALCITON  --  9.01 10.13  ABG  Recent Labs Lab 05/28/2015 2200  PHART 7.54*  PCO2ART 32  PO2ART 116*    Liver Enzymes  Recent Labs Lab 05/23/2015 1726  AST 50*  ALT 27  ALKPHOS 132*  BILITOT 0.5  ALBUMIN 2.4*    Cardiac Enzymes  Recent Labs Lab 04/30/2015 2225 05/19/15 0233 05/19/15 1109  TROPONINI 0.07* 0.17* 0.06*    Glucose  Recent Labs Lab 05/19/15 0750 05/19/15 1143 05/19/15 1612 05/19/15 1955 05/20/15 0008 05/20/15 0424  GLUCAP 136* 165* 149* 201* 176* 119*    Imaging No results found.  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 He was started on Plavix and statin and [facility]  LINES/TUBES: Right Subclavian HD cath  DISCUSSION: 80 year old male past medical history of CVA, end-stage renal disease on dialysis, Chorea, admitted for unresponsiveness and lethargy, intubated in the ER. Patient with signs of aspiration pneumonia  ASSESSMENT / PLAN:  PULMONARY A: VDRF Lethargy Unreponsive COPD P:   -Full vent support; settings reviewed -VAP protocol -ABG -daily CXR  CARDIOVASCULAR A:  Elevated troponin-peaked at 0.17-likely demand ischemia H/o Hypertension P:  -Hemodynamics per ICU protocol -Hold antihypertensives 2/2 shock -Pressors prn to keep MAP>65  RENAL A:   ESRD on HD P:   -HD per nephrology -Monitor and replace electrolytes  GASTROINTESTINAL A:   H/O GERD P:   -PPI  HEMATOLOGIC A:   H/O bladder cancer s/p TURP Anemia-Chronic disease versus blood loss; HG 7.5 today P:  -Transfuse prn -Monitor CBC  INFECTIOUS A:   ?Sepsis P:   -Emperic abx -F/U cultures  ENDOCRINE A:   T2DM P:   -POCT Glucose monitoring with SSI coverage  NEUROLOGIC A:   ? CVA-CT head negative P:   RASS goal: -1 to -2 -Versed and fentanyl for vent sedation   Disposition and family update:  No family at bedside.   Best Practice: Code Status: Full. Diet: NPO GI prophylaxis:  PPI. VTE prophylaxis:  SCD's / heparin.   Magdalene S. [facility] ANP-BC Pulmonary and [place] Pager [phone]   05/20/2015, 6:11 AM   STAFF NOTE: I, Dr. Corrin Parker,  have personally reviewed patient's available data, including medical history, events of note, physical  examination and test results as part of my evaluation. I have discussed with NP and other care providers such as pharmacist, RN and RRT.  In addition,  I personally evaluated patient and elicited key findings  -wheezing, + diaphoretic   A:acute resp failure from aspiration pneumonia in setting of CVA  P:  plan for SAT/SBT today, wean sedation and assess neuro status    The Rest per NP whose note is outlined above and that I agree with  I have personally reviewed/obtained a history, examined the patient, evaluated Pertinent laboratory and RadioGraphic/imaging results, and  formulated the assessment and plan   The Patient requires high complexity decision making for assessment and support, frequent evaluation and titration of therapies, application of advanced monitoring technologies and extensive interpretation of multiple databases. Critical Care Time devoted to patient care services described in this note is 35 minutes.  This Critical care time does not reflrect procedure time or supervisory time of NP but could involve care discussion time Overall, patient is critically ill, prognosis is guarded.  Patient with Multiorgan failure and at  high risk for cardiac arrest and death.    Corrin Parker, M.D.  [facility]  Medical Director [place] Director [facility] Cardio-Pulmonary Department

## 2015-05-20 NOTE — Consult Note (Signed)
Pharmacy Antibiotic Note  Jason Aguilar is a 80 y.o. male admitted on 05/20/2015 with sepsis.  Pharmacy has been consulted for vancomycin and zosyn dosing.  Plan: Patient is a HD patient Tues, Thurs, and Sat schedule. Will change Zosyn to q12 hours dosing and will dose Vancomycin per HD dosing protocol.   Will give Vancomycin 500 mg IV qHD session. Trough order the day of the 3rd dialysis session.   Zosyn: Zosyn 3.375 g IV q12 hours.    Height: 5\' 4"  (162.6 cm) Weight: 127 lb 13.9 oz (58 kg) IBW/kg (Calculated) : 59.2  Temp (24hrs), Avg:98.2 F (36.8 C), Min:97.5 F (36.4 C), Max:99 F (37.2 C)   Recent Labs Lab 05/14/2015 1726 05/03/2015 2225 05/19/15 0437 05/20/15 0503  WBC 35.1*  --  26.3* 24.1*  CREATININE 1.68* 1.76* 1.85* 2.49*  LATICACIDVEN 3.5* 1.7  --   --     Estimated Creatinine Clearance: 20.4 mL/min (by C-G formula based on Cr of 2.49).    Allergies  Allergen Reactions  . Losartan Swelling  . Dristan Rash    Antimicrobials this admission: vancomycin 4/20 >>  zosyn 4/20 >>   Dose adjustments this admission:  Zosyn 3.375 g IV q12 hours 4/21.  Microbiology results: 4/20 BCx: pending 4/20 UCx: pending  4/20 Sputum: needs to be collected    Thank you for allowing pharmacy to be a part of this patient's care.  Rexene Edison, PharmD Clinical Pharmacist 05/20/2015 10:48 AM

## 2015-05-20 NOTE — Progress Notes (Signed)
This note also relates to the following rows which could not be included: Pulse Rate - Cannot attach notes to unvalidated device data Resp - Cannot attach notes to unvalidated device data   PRE TX

## 2015-05-20 NOTE — Progress Notes (Signed)
[facility]  ROUNDING NOTE   Subjective:   Hemodialysis for later today.  Off sedation: moving extremities.   Discussed case with wife over the phone  Objective:  Vital signs in last 24 hours:  Temp:  [97.5 F (36.4 C)-99 F (37.2 C)] 98.2 F (36.8 C) (04/22 0400) Pulse Rate:  [61-85] 70 (04/22 1100) Resp:  [12-21] 17 (04/22 1100) BP: (87-142)/(39-100) 110/50 mmHg (04/22 1100) SpO2:  [96 %-100 %] 99 % (04/22 1100) FiO2 (%):  [30 %-40 %] 30 % (04/22 1132) Weight:  [58 kg (127 lb 13.9 oz)] 58 kg (127 lb 13.9 oz) (04/22 0600)  Weight change: -2.873 kg (-6 lb 5.3 oz) Filed Weights   04/30/2015 2235 05/19/15 0234 05/20/15 0600  Weight: 58 kg (127 lb 13.9 oz) 58 kg (127 lb 13.9 oz) 58 kg (127 lb 13.9 oz)    Intake/Output: I/O last 3 completed shifts: In: 2813.2 [I.V.:2407.5; NG/GT:305.7; IV Piggyback:100] Out: 208 [Urine:208]   Intake/Output this shift:  Total I/O In: 50 [IV Piggyback:50] Out: 0   Physical Exam: General: Critically ill  Head: +ETT  Eyes: Eyes closed  Neck: Supple, trachea midline  Lungs:  Course bilaterally, PRVC 30%  Heart: Regular rate and rhythm  Abdomen:  Soft, nontender  Extremities: no peripheral edema.  Neurologic: Intubated, sedated  Skin: No lesions  Access: RIJ permcath    Basic Metabolic Panel:  Recent Labs Lab 05/06/2015 1726 05/17/2015 2225 05/19/15 0437 05/20/15 0503  NA 137 137 139 139  K 3.2* 3.2* 3.7 4.1  CL 97* 102 103 102  CO2 26 27 27 26   GLUCOSE 144* 190* 160* 151*  BUN 16 24* 33* 72*  CREATININE 1.68* 1.76* 1.85* 2.49*  CALCIUM 10.0 9.6 10.1 10.3  MG  --   --  1.7  --     Liver Function Tests:  Recent Labs Lab 05/10/2015 1726  AST 50*  ALT 27  ALKPHOS 132*  BILITOT 0.5  PROT 6.3*  ALBUMIN 2.4*   No results for input(s): LIPASE, AMYLASE in the last 168 hours. No results for input(s): AMMONIA in the last 168 hours.  CBC:  Recent Labs Lab 05/01/2015 1726 05/19/15 0437 05/20/15 0503  WBC  35.1* 26.3* 24.1*  NEUTROABS 33.0*  --   --   HGB 9.5* 8.1* 7.5*  HCT 29.2* 24.7* 23.6*  MCV 89.6 88.6 91.8  PLT 329 262 223    Cardiac Enzymes:  Recent Labs Lab 05/09/2015 1726 04/29/2015 2225 05/19/15 0233 05/19/15 1109  CKMB  --  10.0* 12.9* 1.3  TROPONINI 0.25* 0.07* 0.17* 0.06*    BNP: Invalid input(s): POCBNP  CBG:  Recent Labs Lab 05/19/15 1612 05/19/15 1955 05/20/15 0008 05/20/15 0424 05/20/15 0722  GLUCAP 149* 201* 176* 119* 204*    Microbiology: Results for orders placed or performed during the hospital encounter of 05/19/2015  Urine culture     Status: None (Preliminary result)   Collection Time: 05/26/2015  5:26 PM  Result Value Ref Range Status   Specimen Description URINE, RANDOM  Final   Special Requests NONE  Final   Culture HOLDING FOR POSSIBLE PATHOGEN  Final   Report Status PENDING  Incomplete  Blood Culture (routine x 2)     Status: None (Preliminary result)   Collection Time: 05/10/2015  6:13 PM  Result Value Ref Range Status   Specimen Description BLOOD LEFT ARM  Final   Special Requests BOTTLES DRAWN AEROBIC AND ANAEROBIC [place]  Final   Culture NO GROWTH < 24  HOURS  Final   Report Status PENDING  Incomplete  Blood Culture (routine x 2)     Status: None (Preliminary result)   Collection Time: 05/10/2015  6:13 PM  Result Value Ref Range Status   Specimen Description BLOOD RIGHT ASSIST CONTROL  Final   Special Requests BOTTLES DRAWN AEROBIC AND ANAEROBIC 8CC  Final   Culture NO GROWTH < 12 HOURS  Final   Report Status PENDING  Incomplete  MRSA PCR Screening     Status: None   Collection Time: 05/19/2015 10:29 PM  Result Value Ref Range Status   MRSA by PCR NEGATIVE NEGATIVE Final    Comment:        The GeneXpert MRSA Assay (FDA approved for NASAL specimens only), is one component of a comprehensive MRSA colonization surveillance program. It is not intended to diagnose MRSA infection nor to guide or monitor treatment for MRSA infections.      Coagulation Studies:  Recent Labs  05/17/2015 1726  LABPROT 13.0  INR 0.96    Urinalysis:  Recent Labs  05/17/2015 1726  COLORURINE YELLOW*  LABSPEC 1.014  PHURINE 5.0  GLUCOSEU NEGATIVE  HGBUR 1+*  BILIRUBINUR NEGATIVE  KETONESUR NEGATIVE  PROTEINUR 100*  NITRITE NEGATIVE  LEUKOCYTESUR 1+*      Imaging: Ct Head Wo Contrast  05/24/2015  CLINICAL DATA:  Patient found unresponsive today. No known injury. Initial encounter. EXAM: CT HEAD WITHOUT CONTRAST TECHNIQUE: Contiguous axial images were obtained from the base of the skull through the vertex without intravenous contrast. COMPARISON:  Brain MRI 04/15/2015.  Head CT scan 04/13/2015. FINDINGS: Atrophy and extensive chronic microvascular ischemic change are again seen. No evidence of acute intracranial abnormality including hemorrhage, infarct, mass lesion, mass effect, midline shift or abnormal extra-axial fluid collection is seen. Scattered ethmoid air cell disease identified. Mucosal thickening is seen in the right maxillary sinus and unchanged. The calvarium is intact. IMPRESSION: No acute abnormality. Atrophy and extensive chronic microvascular ischemic change. Electronically Signed   By: Inge Rise M.D.   On: 05/09/2015 19:30   Dg Chest Port 1 View  05/20/2015  CLINICAL DATA:  Respiratory failure. History of hypertension and COPD. EXAM: PORTABLE CHEST 1 VIEW COMPARISON:  05/19/2015 FINDINGS: Endotracheal tube tip lies just above the Carina. Right internal jugular central venous line tip projects in the lower superior vena cava. Left internal jugular central venous line tip projects in the central left brachiocephalic vein. Nasal/ orogastric tube passes below the diaphragm to curl within the stomach. Minor basilar atelectasis. Lungs are otherwise clear. No pleural effusion or pneumothorax. Cardiac silhouette is normal in size. IMPRESSION: 1. No significant change from the previous day's study. 2. Support apparatus stable  and well positioned. No acute findings in the lungs. Electronically Signed   By: Lajean Manes M.D.   On: 05/20/2015 07:12   Dg Chest Port 1 View  05/19/2015  CLINICAL DATA:  Acute respiratory failure. EXAM: PORTABLE CHEST 1 VIEW COMPARISON:  Yesterday at 2202 hours FINDINGS: Endotracheal tube 3.2 cm from the carina. Enteric tube in place. Tip of the left central line projecting over the distal brachiocephalic vein, unchanged. Right dialysis catheter in place. Improving left basilar aeration with decreasing hazy opacity. Decreasing right infrahilar atelectasis. No pneumothorax. Cardiomediastinal contours are unchanged. No pulmonary edema. IMPRESSION: 1. Improving bibasilar aeration. 2. Support apparatus unchanged. Electronically Signed   By: Jeb Levering M.D.   On: 05/19/2015 03:12   Dg Chest Port 1 View  05/19/2015  CLINICAL DATA:  Central  line placement. EXAM: PORTABLE CHEST 1 VIEW COMPARISON:  Earlier this day at 75 hour FINDINGS: New left internal jugular central venous catheter, tip projects over the region of the distal brachiocephalic vein. No pneumothorax. Endotracheal tube is now 2.2 cm from the carina. Enteric tube in place. A right-sided dialysis catheter remains in place. Developing hazy opacity at the left lung base, may be combination of pleural effusion and atelectasis. There is increasing right infrahilar atelectasis. No frank pulmonary edema. Cardiomediastinal contours are unchanged. IMPRESSION: 1. Tip of the left internal jugular central venous catheter projects over the region the distal brachiocephalic vein. No pneumothorax. 2. Endotracheal tube is now 2.2 cm from the carina, and appears advanced from prior. 3. Increasing left lung base opacity, likely combination of atelectasis and developing pleural effusion. Increasing right infrahilar atelectasis. Electronically Signed   By: Jeb Levering M.D.   On: 05/26/2015 22:25   Dg Chest Port 1 View  05/14/2015  CLINICAL DATA:   Unresponsive at dialysis today. Sleep apnea. Diabetes. COPD. EXAM: PORTABLE CHEST 1 VIEW COMPARISON:  03/29/2015 FINDINGS: Right IJ dialysis catheter tip: Cavoatrial junction. Endotracheal tube tip: 5 cm above the carina. Nasogastric tube enters the stomach. Atherosclerotic aortic arch. No cardiomegaly. Thoracic spondylosis. Bony demineralization. The lungs appear clear. IMPRESSION: 1. No edema or cardiomegaly. Support apparatus satisfactorily position. 2. Bony demineralization. 3. Atherosclerosis. Electronically Signed   By: Van Clines M.D.   On: 04/30/2015 18:13     Medications:   . feeding supplement (VITAL HIGH PROTEIN) 1,000 mL (05/20/15 0607)  . fentaNYL infusion INTRAVENOUS Stopped (05/20/15 0815)   . antiseptic oral rinse  7 mL Mouth Rinse QID  . chlorhexidine gluconate (SAGE KIT)  15 mL Mouth Rinse BID  . etomidate  20 mg Intravenous Once  . heparin subcutaneous  5,000 Units Subcutaneous Q8H  . insulin aspart  2-6 Units Subcutaneous Q4H  . pantoprazole (PROTONIX) IV  40 mg Intravenous Q24H  . piperacillin-tazobactam (ZOSYN)  IV  3.375 g Intravenous Q12H  . senna  1 tablet Oral Daily  . vancomycin  500 mg Intravenous Q T,Th,Sa-HD   sodium chloride, fentaNYL, hydrALAZINE, metoprolol, midazolam, midazolam  Assessment/ Plan:  [name] is a 80 y.o. white male 80 y.o. 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   1. ESRD: TTS through [facility]. - dialysis for later today. Orders prepared.    2. Anemia of chronic kidney disease:  Hemoglobin 7.5 - epo with treatment  3. Secondary Hyperparathyroidism: with hypercalcemia - he is on calcium acetate for binding.   4. Diabetes Mellitus type II: with chronic kidney disease: hemoglobin A1c of 11.2% on 03/29/2015.   5. Hypertension: difficult to control in past.   - well controlled currently. Not currently on vasopressors.    LOS: 2 Jason Aguilar 4/22/201711:50 AM

## 2015-05-20 NOTE — Progress Notes (Signed)
Wife and daughter at bedside, I have updated them on acute cardiac arrest and patients poor prognosis. The wife and daughter agree that they don't want patient to suffer anymore, They have consented and agreed with DNR status Will continue current treatment and place DNR  Orders  ICU nurse notified   Overall, patient is critically ill, prognosis is guarded.  Patient with Multiorgan failure and at high risk for cardiac arrest and death.    Corrin Parker, M.D.  [facility]  Medical Director [place] Director [facility] Cardio-Pulmonary Department

## 2015-05-21 ENCOUNTER — Inpatient Hospital Stay: Payer: Medicare Other

## 2015-05-21 DIAGNOSIS — I469 Cardiac arrest, cause unspecified: Secondary | ICD-10-CM

## 2015-05-21 LAB — CBC WITH DIFFERENTIAL/PLATELET
BASOS ABS: 0 10*3/uL (ref 0–0.1)
BLASTS: 0 %
Band Neutrophils: 8 %
Basophils Relative: 0 %
EOS ABS: 0 10*3/uL (ref 0–0.7)
EOS PCT: 0 %
HCT: 25.4 % — ABNORMAL LOW (ref 40.0–52.0)
HEMOGLOBIN: 7.9 g/dL — AB (ref 13.0–18.0)
LYMPHS ABS: 2.5 10*3/uL (ref 1.0–3.6)
LYMPHS PCT: 7 %
MCH: 29.3 pg (ref 26.0–34.0)
MCHC: 31.3 g/dL — AB (ref 32.0–36.0)
MCV: 93.5 fL (ref 80.0–100.0)
Metamyelocytes Relative: 3 %
Monocytes Absolute: 0.7 10*3/uL (ref 0.2–1.0)
Monocytes Relative: 2 %
Myelocytes: 2 %
NEUTROS ABS: 32.3 10*3/uL — AB (ref 1.4–6.5)
Neutrophils Relative %: 76 %
OTHER: 0 %
Platelets: 238 10*3/uL (ref 150–440)
Promyelocytes Absolute: 2 %
RBC: 2.71 MIL/uL — AB (ref 4.40–5.90)
RDW: 14.7 % — ABNORMAL HIGH (ref 11.5–14.5)
WBC: 35.5 10*3/uL — ABNORMAL HIGH (ref 3.8–10.6)
nRBC: 0 /100 WBC

## 2015-05-21 LAB — BASIC METABOLIC PANEL
Anion gap: 16 — ABNORMAL HIGH (ref 5–15)
BUN: 46 mg/dL — AB (ref 6–20)
CALCIUM: 9.4 mg/dL (ref 8.9–10.3)
CO2: 25 mmol/L (ref 22–32)
Chloride: 102 mmol/L (ref 101–111)
Creatinine, Ser: 1.79 mg/dL — ABNORMAL HIGH (ref 0.61–1.24)
GFR calc Af Amer: 40 mL/min — ABNORMAL LOW (ref >60)
GFR, EST NON AFRICAN AMERICAN: 35 mL/min — AB (ref >60)
GLUCOSE: 285 mg/dL — AB (ref 65–99)
POTASSIUM: 4.6 mmol/L (ref 3.5–5.1)
SODIUM: 143 mmol/L (ref 135–145)

## 2015-05-21 LAB — LACTIC ACID, PLASMA
Lactic Acid, Venous: 15.7 mmol/L (ref 0.5–2.0)
Lactic Acid, Venous: 9.9 mmol/L (ref 0.5–2.0)
Lactic Acid, Venous: 2.4 mmol/L (ref 0.5–2.0)

## 2015-05-21 LAB — GLUCOSE, CAPILLARY
GLUCOSE-CAPILLARY: 213 mg/dL — AB (ref 65–99)
GLUCOSE-CAPILLARY: 257 mg/dL — AB (ref 65–99)
GLUCOSE-CAPILLARY: 137 mg/dL — AB (ref 65–99)
GLUCOSE-CAPILLARY: 147 mg/dL — AB (ref 65–99)
GLUCOSE-CAPILLARY: 146 mg/dL — AB (ref 65–99)
GLUCOSE-CAPILLARY: 146 mg/dL — AB (ref 65–99)
GLUCOSE-CAPILLARY: 147 mg/dL — AB (ref 65–99)
Glucose-Capillary: 247 mg/dL — ABNORMAL HIGH (ref 65–99)
Glucose-Capillary: 251 mg/dL — ABNORMAL HIGH (ref 65–99)
Glucose-Capillary: 271 mg/dL — ABNORMAL HIGH (ref 65–99)
Glucose-Capillary: 163 mg/dL — ABNORMAL HIGH (ref 65–99)
Glucose-Capillary: 118 mg/dL — ABNORMAL HIGH (ref 65–99)
Glucose-Capillary: 147 mg/dL — ABNORMAL HIGH (ref 65–99)

## 2015-05-21 LAB — HEPATITIS B SURFACE ANTIGEN: Hepatitis B Surface Ag: NEGATIVE

## 2015-05-21 LAB — MAGNESIUM: Magnesium: 1.9 mg/dL (ref 1.7–2.4)

## 2015-05-21 MED ORDER — ANTISEPTIC ORAL RINSE SOLUTION (CORINZ)
7.0000 mL | OROMUCOSAL | Status: DC
  Administered 2015-05-21 – 2015-05-23 (×23): 7 mL via OROMUCOSAL
  Filled 2015-05-21 (×32): qty 7

## 2015-05-21 MED ORDER — POTASSIUM CHLORIDE 20 MEQ PO PACK
60.0000 meq | PACK | Freq: Once | ORAL | Status: AC
  Administered 2015-05-21: 60 meq via NASOGASTRIC
  Filled 2015-05-21: qty 3

## 2015-05-21 MED ORDER — SODIUM CHLORIDE 0.9 % IV BOLUS (SEPSIS)
1000.0000 mL | Freq: Once | INTRAVENOUS | Status: AC
  Administered 2015-05-21: 1000 mL via INTRAVENOUS

## 2015-05-21 MED ORDER — SODIUM CHLORIDE 0.9 % IV SOLN
INTRAVENOUS | Status: DC
  Administered 2015-05-21: 09:00:00 via INTRAVENOUS
  Filled 2015-05-21: qty 2.5

## 2015-05-21 MED ORDER — CHLORHEXIDINE GLUCONATE 0.12% ORAL RINSE (MEDLINE KIT)
15.0000 mL | Freq: Two times a day (BID) | OROMUCOSAL | Status: DC
  Administered 2015-05-21 – 2015-05-23 (×5): 15 mL via OROMUCOSAL
  Filled 2015-05-21 (×7): qty 15

## 2015-05-21 MED ORDER — SODIUM CHLORIDE 0.9 % IV BOLUS (SEPSIS): 500.0000 mL | Freq: Once | INTRAVENOUS | Status: DC

## 2015-05-21 MED ORDER — POTASSIUM CHLORIDE 20 MEQ PO PACK: 60.0000 meq | PACK | Freq: Two times a day (BID) | ORAL | Status: DC

## 2015-05-21 MED FILL — Medication: Qty: 1 | Status: AC

## 2015-05-21 NOTE — Progress Notes (Signed)
[facility]  ROUNDING NOTE   Subjective:   Hemodialysis yesterday. Bradycardia on dialysis then dialysis held. Code Blue last night after dialysis.   Objective:  Vital signs in last 24 hours:  Temp:  [96.4 F (35.8 C)-98.6 F (37 C)] 98.1 F (36.7 C) (04/23 0800) Pulse Rate:  [48-185] 56 (04/23 0800) Resp:  [12-29] 15 (04/23 0800) BP: (58-180)/(34-134) 129/38 mmHg (04/23 0800) SpO2:  [54 %-100 %] 100 % (04/23 0800) FiO2 (%):  [30 %-40 %] 40 % (04/23 0400) Weight:  [57.1 kg (125 lb 14.1 oz)-59 kg (130 lb 1.1 oz)] 59 kg (130 lb 1.1 oz) (04/23 0433)  Weight change: -0.9 kg (-1 lb 15.8 oz) Filed Weights   05/20/15 0600 05/20/15 1840 05/21/15 0433  Weight: 58 kg (127 lb 13.9 oz) 57.1 kg (125 lb 14.1 oz) 59 kg (130 lb 1.1 oz)    Intake/Output: I/O last 3 completed shifts: In: 2566.5 [I.V.:946.5; NG/GT:520; IV Piggyback:1100] Out: -34 [Stool:1]   Intake/Output this shift:  Total I/O In: 58.8 [I.V.:8.8; IV Piggyback:50] Out: -   Physical Exam: General: Critically ill  Head: +ETT, +OGT  Eyes: Eyes closed  Neck: Supple, trachea midline  Lungs:  Course bilaterally, PRVC 40%  Heart: Regular rate and rhythm  Abdomen:  Soft, nontender  Extremities: no peripheral edema.  Neurologic: Intubated, sedated  Skin: No lesions  Access: RIJ permcath    Basic Metabolic Panel:  Recent Labs Lab 05/07/2015 2225 05/19/15 0437 05/20/15 0503 05/20/15 2304 05/21/15 0758  NA 137 139 139 145 143  K 3.2* 3.7 4.1 3.2* 4.6  CL 102 103 102 100* 102  CO2 27 27 26  16* 25  GLUCOSE 190* 160* 151* 213* 285*  BUN 24* 33* 72* 34* 46*  CREATININE 1.76* 1.85* 2.49* 1.54* 1.79*  CALCIUM 9.6 10.1 10.3 9.7 9.4  MG  --  1.7  --  2.1 1.9  PHOS  --   --   --  7.7*  --     Liver Function Tests:  Recent Labs Lab 05/03/2015 1726  AST 50*  ALT 27  ALKPHOS 132*  BILITOT 0.5  PROT 6.3*  ALBUMIN 2.4*   No results for input(s): LIPASE, AMYLASE in the last 168 hours. No results for  input(s): AMMONIA in the last 168 hours.  CBC:  Recent Labs Lab 05/09/2015 1726 05/19/15 0437 05/20/15 0503 05/20/15 2304  WBC 35.1* 26.3* 24.1* 35.5*  NEUTROABS 33.0*  --   --  32.3*  HGB 9.5* 8.1* 7.5* 7.9*  HCT 29.2* 24.7* 23.6* 25.4*  MCV 89.6 88.6 91.8 93.5  PLT 329 262 223 238    Cardiac Enzymes:  Recent Labs Lab 05/24/2015 1726 05/09/2015 2225 05/19/15 0233 05/19/15 1109  CKMB  --  10.0* 12.9* 1.3  TROPONINI 0.25* 0.07* 0.17* 0.06*    BNP: Invalid input(s): POCBNP  CBG:  Recent Labs Lab 05/20/15 1535 05/20/15 2024 05/20/15 2339 05/21/15 0336 05/21/15 0707  GLUCAP 134* 136* 179* 247* 251*    Microbiology: Results for orders placed or performed during the hospital encounter of 05/04/2015  Urine culture     Status: None (Preliminary result)   Collection Time: 05/09/2015  5:26 PM  Result Value Ref Range Status   Specimen Description URINE, RANDOM  Final   Special Requests NONE  Final   Culture HOLDING FOR POSSIBLE PATHOGEN  Final   Report Status PENDING  Incomplete  Blood Culture (routine x 2)     Status: None (Preliminary result)   Collection Time: 05/19/2015  6:13 PM  Result Value Ref Range Status   Specimen Description BLOOD LEFT ARM  Final   Special Requests BOTTLES DRAWN AEROBIC AND ANAEROBIC [place]  Final   Culture NO GROWTH 3 DAYS  Final   Report Status PENDING  Incomplete  Blood Culture (routine x 2)     Status: None (Preliminary result)   Collection Time: 05/01/2015  6:13 PM  Result Value Ref Range Status   Specimen Description BLOOD RIGHT ASSIST CONTROL  Final   Special Requests BOTTLES DRAWN AEROBIC AND ANAEROBIC 8CC  Final   Culture NO GROWTH 3 DAYS  Final   Report Status PENDING  Incomplete  MRSA PCR Screening     Status: None   Collection Time: 05/04/2015 10:29 PM  Result Value Ref Range Status   MRSA by PCR NEGATIVE NEGATIVE Final    Comment:        The GeneXpert MRSA Assay (FDA approved for NASAL specimens only), is one component of  a comprehensive MRSA colonization surveillance program. It is not intended to diagnose MRSA infection nor to guide or monitor treatment for MRSA infections.     Coagulation Studies:  Recent Labs  05/22/2015 1726 05/20/15 2304  LABPROT 13.0 15.7*  INR 0.96 1.23    Urinalysis:  Recent Labs  05/20/2015 1726  COLORURINE YELLOW*  LABSPEC 1.014  PHURINE 5.0  GLUCOSEU NEGATIVE  HGBUR 1+*  BILIRUBINUR NEGATIVE  KETONESUR NEGATIVE  PROTEINUR 100*  NITRITE NEGATIVE  LEUKOCYTESUR 1+*      Imaging: Dg Chest Port 1 View  05/20/2015  CLINICAL DATA:  Respiratory failure. History of hypertension and COPD. EXAM: PORTABLE CHEST 1 VIEW COMPARISON:  05/19/2015 FINDINGS: Endotracheal tube tip lies just above the Carina. Right internal jugular central venous line tip projects in the lower superior vena cava. Left internal jugular central venous line tip projects in the central left brachiocephalic vein. Nasal/ orogastric tube passes below the diaphragm to curl within the stomach. Minor basilar atelectasis. Lungs are otherwise clear. No pleural effusion or pneumothorax. Cardiac silhouette is normal in size. IMPRESSION: 1. No significant change from the previous day's study. 2. Support apparatus stable and well positioned. No acute findings in the lungs. Electronically Signed   By: Lajean Manes M.D.   On: 05/20/2015 07:12     Medications:   . amiodarone Stopped (05/21/15 0248)  . feeding supplement (VITAL HIGH PROTEIN) Stopped (05/20/15 2100)  . fentaNYL infusion INTRAVENOUS 50 mcg/hr (05/21/15 0600)  . insulin (NOVOLIN-R) infusion    . norepinephrine (LEVOPHED) Adult infusion 4.053 mcg/min (05/21/15 0600)  . vasopressin (PITRESSIN) infusion - *FOR SHOCK* Stopped (05/21/15 0612)   . antiseptic oral rinse  7 mL Mouth Rinse 10 times per day  . chlorhexidine gluconate (SAGE KIT)  15 mL Mouth Rinse BID  . etomidate  20 mg Intravenous Once  . heparin subcutaneous  5,000 Units Subcutaneous Q8H   . pantoprazole (PROTONIX) IV  40 mg Intravenous Q24H  . piperacillin-tazobactam (ZOSYN)  IV  3.375 g Intravenous Q12H  . senna  1 tablet Oral Daily  . sodium bicarbonate      . vancomycin  500 mg Intravenous Q T,Th,Sa-HD   sodium chloride, fentaNYL, hydrALAZINE, metoprolol, midazolam, midazolam  Assessment/ Plan:  Jason Aguilar is a 78 y.o. white male 80 y.o. 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   1. ESRD: TTS through [facility]  permcath. Did not tolerated dialysis yesterday.   2. Anemia of chronic kidney disease:  Hemoglobin 7.9 - epo with treatment  3. Secondary Hyperparathyroidism: with hypercalcemia on admission.  - he is on calcium acetate for binding as outpatient.   4. Diabetes Mellitus type II: with chronic kidney disease: hemoglobin A1c of 11.2% on 03/29/2015.   5. Hypertension: difficult to control in past.  - well controlled currently. Not currently on vasopressors.   Overall prognosis is poor. Discussed with family   LOS: [place], [place] 4/23/20178:57 AM

## 2015-05-21 NOTE — Consult Note (Signed)
Pharmacy Antibiotic Note  Jason Aguilar is a 80 y.o. male admitted on 05/15/2015 with sepsis.  Pharmacy has been consulted for vancomycin and zosyn dosing.  Plan: Continue vancomycin 500mg  IV QHD and zosyn 3.375gm IV Q12H  Patient is a HD patient Tues, Thurs, and Sat schedule.  Vancomycin trough ordered the day of the 3rd dialysis session. Target trough: 15-25mg c/ml   Height: 5\' 4"  (162.6 cm) Weight: 130 lb 1.1 oz (59 kg) IBW/kg (Calculated) : 59.2  Temp (24hrs), Avg:97.8 F (36.6 C), Min:96.4 F (35.8 C), Max:98.6 F (37 C)   Recent Labs Lab 05/15/2015 1726 05/17/2015 2225 05/19/15 0437 05/20/15 0503 05/20/15 2304 05/21/15 0308 05/21/15 0758  WBC 35.1*  --  26.3* 24.1* 35.5*  --   --   CREATININE 1.68* 1.76* 1.85* 2.49* 1.54*  --  1.79*  LATICACIDVEN 3.5* 1.7  --   --  15.7* 9.9* 2.4*    Estimated Creatinine Clearance: 28.8 mL/min (by C-G formula based on Cr of 1.79).    Allergies  Allergen Reactions  . Losartan Swelling  . Dristan Rash    Antimicrobials this admission: vancomycin 4/20 >>  zosyn 4/20 >>   Dose adjustments this admission:  Zosyn 3.375 g IV q12 hours 4/21.  Microbiology results: 4/20 BCx: NGTD 4/20 UCx: >100k GNR 4/20 Sputum: needs to be collected    Thank you for allowing pharmacy to be a part of this patient's care.  Rexene Edison, PharmD Clinical Pharmacist 05/21/2015 11:11 AM

## 2015-05-21 NOTE — Progress Notes (Signed)
Notified by Nurse Domingo Pulse) that patient's wife wants to revoke DNR order. Responded to patient's room and evaluated patient. He is in NSR at 73 with a MAP of 61 post-resuscitation. Code labs reviewed, potassium supplemented and 1L bolus of NS ordered for a lactate level of 15.7. Repeat lactic acid level 9.9. Explained to patient's wife that since he is stable, I will defer to Dr. Mortimer Fries to discuss code status with her this morning since he already had an extensive conversation with her after patient coded.  However, incase of an acute change in patient's status in the interim, we will intervene accordingly.  Patient's code status changed to Full and DNR order discontinued.   Magdalene S. [facility] ANP-BC Pulmonary and Critical Care Medicine [facility] Pager [phone]

## 2015-05-21 NOTE — Progress Notes (Signed)
Tye [place], charge RN requested I take over pt. Care as care nurse unable r/t other pt.'s status at this time. Will continue to monitor pt. Closely.

## 2015-05-21 NOTE — Progress Notes (Signed)
Rested well throughout shift. Fentanyl drip titrated down.  Withdraws and localizes pain.  NSR per cardiac monitor this afternoon and evening. Blood pressure stable on levophed drip and attempting to titrate drip down.  No void this shift and RN bladder scanned patient which resulted 0 ml.

## 2015-05-21 NOTE — Progress Notes (Signed)
PULMONARY / CRITICAL CARE MEDICINE   Name: Jason Aguilar MRN: [id number] DOB: 02-25-1935    ADMISSION DATE:  05/13/2015  CHIEF COMPLAINT:   Unresponsive, lethargic, intuated  HISTORY OF PRESENT ILLNESS:   80 year old male past medical history of end-stage renal disease on dialysis, recent CVA, hematuria syndrome, GERD,hyperlipidemia, OSA on CPAP, bladder cell carcinoma, diabetes type 2, minute from the ER after being found unresponsive after dialysis with hypoxia. History per wife at bedside and chart review. Y states that patient had a recent admission to [facility] little over a month ago, he was diagnosed with a stroke, his main neurological deficits were some right-sided tingling, and dysphagia, he was transferred to [facility] for neurologic rehabilitation. Over the last week he has had depressed mentation, increased confusion, and lethargy. Today he had his dialysis session, and after being picked up and brought back to [facility] he was noted to be lethargic and unresponsive to sternal rub. EMS was called patient was brought to the ER he was noted to have saturation of 84% and not responding to verbal stimuli, not able to clear his secretions, he was subsequently intubated for airway protection. CCM was called for further admission and management.  SUBJECTIVE:  S/p cardiac arrest, Remains on full vent support and off pressors Family reversed DNR status, patient now a FULL CODE Prognosis is very poor  VITAL SIGNS: BP 100/39 mmHg  Pulse 58  Temp(Src) 96.8 F (36 C) (Axillary)  Resp 17  Ht 5\' 4"  (1.626 m)  Wt 130 lb 1.1 oz (59 kg)  BMI 22.32 kg/m2  SpO2 100%  HEMODYNAMICS:    VENTILATOR SETTINGS: Vent Mode:  [-] PRVC FiO2 (%):  [30 %-40 %] 40 % Set Rate:  [14 bmp] 14 bmp Vt Set:  [500 mL] 500 mL PEEP:  [5 cmH20] 5 cmH20 Plateau Pressure:  [11 cmH20-14 cmH20] 14 cmH20  INTAKE / OUTPUT: I/O last 3 completed shifts: In: 2557.7 [I.V.:937.7; NG/GT:520;  IV Piggyback:1100] Out: -4 [Stool:1]  Physical Exam  Constitutional: He appears well-developed and well-nourished.  HENT:  Head: Normocephalic and atraumatic.  Right Ear: External ear normal.  Left Ear: External ear normal.  Mouth/Throat: Oropharynx is clear and moist.  Eyes:  Sluggish pupils  Neck: Neck supple. No thyromegaly present.  Cardiovascular: Normal rate, regular rhythm, normal heart sounds and intact distal pulses.  No murmur heard. Pulmonary/Chest: He is in respiratory distress. He has no wheezes. He has no rales. He exhibits no tenderness.  On MV, coarse upper airway sounds, no wheezes  Abdominal: Soft. Bowel sounds are normal. He exhibits no distension.  Genitourinary: Penis normal.  Neurological:  Sedated, gcs<8T  Skin: Skin is warm and dry.   LABS:  BMET  Recent Labs Lab 05/19/15 0437 05/20/15 0503 05/20/15 2304  NA 139 139 145  K 3.7 4.1 3.2*  CL 103 102 100*  CO2 27 26 16*  BUN 33* 72* 34*  CREATININE 1.85* 2.49* 1.54*  GLUCOSE 160* 151* 213*    Electrolytes  Recent Labs Lab 05/19/15 0437 05/20/15 0503 05/20/15 2304  CALCIUM 10.1 10.3 9.7  MG 1.7  --  2.1  PHOS  --   --  7.7*    CBC  Recent Labs Lab 05/19/15 0437 05/20/15 0503 05/20/15 2304  WBC 26.3* 24.1* 35.5*  HGB 8.1* 7.5* 7.9*  HCT 24.7* 23.6* 25.4*  PLT 262 223 238    Coag's  Recent Labs Lab 05/27/2015 1726 05/20/15 2304  APTT 31 45*  INR 0.96  1.23    Sepsis Markers  Recent Labs Lab 05/06/2015 2225 05/19/15 0437 05/20/15 0503 05/20/15 2304 05/21/15 0308  LATICACIDVEN 1.7  --   --  15.7* 9.9*  PROCALCITON 9.01 10.13 8.02  --   --     ABG  Recent Labs Lab 05/11/2015 2200 05/21/15 0501  PHART 7.54* 7.47*  PCO2ART 32 32  PO2ART 116* 176*    Liver Enzymes  Recent Labs Lab 05/12/2015 1726  AST 50*  ALT 27  ALKPHOS 132*  BILITOT 0.5  ALBUMIN 2.4*    Cardiac Enzymes  Recent Labs Lab 05/24/2015 2225 05/19/15 0233 05/19/15 1109  TROPONINI  0.07* 0.17* 0.06*    Glucose  Recent Labs Lab 05/20/15 1201 05/20/15 1535 05/20/15 2024 05/20/15 2339 05/21/15 0336 05/21/15 0707  GLUCAP 182* 134* 136* 179* 247* 251*    Imaging No results found.  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 He was started on Plavix and statin and discharged to [facility] for neurorehab  LINES/TUBES: Right Subclavian HD cath  DISCUSSION: 80 year old male past medical history of CVA, end-stage renal disease on dialysis, Chorea, admitted for unresponsiveness and lethargy, intubated in the ER. Patient with signs of aspiration pneumonia s/p cardiac arrest  ASSESSMENT / PLAN:  PULMONARY A: VDRF Lethargy Unreponsive COPD P:   -Full vent support; settings reviewed -VAP protocol -ABG -daily CXR  CARDIOVASCULAR A:  Elevated troponin-peaked at 0.17-likely demand ischemia H/o Hypertension P:  -Hemodynamics per ICU protocol -Hold antihypertensives 2/2 shock -Pressors prn to keep MAP>65  RENAL A:   ESRD on HD P:   -HD per nephrology -Monitor and replace electrolytes  GASTROINTESTINAL A:   H/O GERD P:   -PPI  HEMATOLOGIC A:   H/O bladder cancer s/p TURP Anemia-Chronic disease versus blood loss; HG 7.5 today P:  -Transfuse prn -Monitor CBC  INFECTIOUS A:   ?Sepsis P:   -Emperic abx -F/U cultures  ENDOCRINE A:   T2DM P:   -POCT Glucose monitoring with SSI coverage  NEUROLOGIC A:   ? CVA-CT head negative P:   RASS goal: -1 to -2 -Versed and fentanyl for vent sedation -may consider CT head   Disposition and family update:  No family at bedside.   Best Practice: Code Status: Full. Diet: NPO GI prophylaxis:  PPI. VTE prophylaxis:  SCD's / heparin.   Magdalene  S. [facility] ANP-BC [facility] and [place] Pager [phone]   05/21/2015, 7:17 AM   STAFF NOTE: I, Dr. Corrin Parker,  have personally reviewed patient's available data, including medical history, events of note, physical examination and test results as part of my evaluation. I have discussed with NP and other care providers such as pharmacist, RN and RRT.  In addition,  I personally evaluated patient and elicited key findings  -wheezing, + diaphoretic   A:acute resp failure from aspiration pneumonia in setting of CVA  P:  plan for SAT/SBT today, wean sedation and assess neuro status  Now full code  The Rest per NP whose note is outlined above and that I agree with  I have personally reviewed/obtained a history, examined the patient, evaluated Pertinent laboratory and [employer]/imaging results, and  formulated the assessment and plan   The Patient requires high complexity decision making for assessment and support, frequent evaluation and titration of therapies, application of advanced  monitoring technologies and extensive interpretation of multiple databases. Critical Care Time devoted to patient care services described in this note is 35 minutes.   This Critical care time does not reflrect procedure time or supervisory time of NP but could involve care discussion time Overall, patient is critically ill, prognosis is guarded.  Patient with Multiorgan failure and at high risk for cardiac arrest and death.    Corrin Parker, M.D.  [facility]  Medical Director [place] Director [facility] Cardio-Pulmonary Department

## 2015-05-22 ENCOUNTER — Inpatient Hospital Stay: Payer: Medicare Other

## 2015-05-22 DIAGNOSIS — N390 Urinary tract infection, site not specified: Secondary | ICD-10-CM

## 2015-05-22 DIAGNOSIS — R4189 Other symptoms and signs involving cognitive functions and awareness: Secondary | ICD-10-CM | POA: Insufficient documentation

## 2015-05-22 DIAGNOSIS — N492 Inflammatory disorders of scrotum: Secondary | ICD-10-CM

## 2015-05-22 DIAGNOSIS — N493 Fournier gangrene: Secondary | ICD-10-CM

## 2015-05-22 LAB — GLUCOSE, CAPILLARY
Glucose-Capillary: 149 mg/dL — ABNORMAL HIGH (ref 65–99)
Glucose-Capillary: 151 mg/dL — ABNORMAL HIGH (ref 65–99)
Glucose-Capillary: 154 mg/dL — ABNORMAL HIGH (ref 65–99)
Glucose-Capillary: 158 mg/dL — ABNORMAL HIGH (ref 65–99)
Glucose-Capillary: 178 mg/dL — ABNORMAL HIGH (ref 65–99)
Glucose-Capillary: 181 mg/dL — ABNORMAL HIGH (ref 65–99)

## 2015-05-22 LAB — BASIC METABOLIC PANEL
ANION GAP: 11 (ref 5–15)
BUN: 79 mg/dL — ABNORMAL HIGH (ref 6–20)
CALCIUM: 9.9 mg/dL (ref 8.9–10.3)
CO2: 29 mmol/L (ref 22–32)
CREATININE: 2.64 mg/dL — AB (ref 0.61–1.24)
Chloride: 104 mmol/L (ref 101–111)
GFR calc Af Amer: 25 mL/min — ABNORMAL LOW (ref >60)
GFR calc non Af Amer: 22 mL/min — ABNORMAL LOW (ref >60)
GLUCOSE: 172 mg/dL — AB (ref 65–99)
Potassium: 5 mmol/L (ref 3.5–5.1)
Sodium: 144 mmol/L (ref 135–145)

## 2015-05-22 LAB — CBC
HEMATOCRIT: 22.9 % — AB (ref 40.0–52.0)
Hemoglobin: 7.5 g/dL — ABNORMAL LOW (ref 13.0–18.0)
MCH: 29.1 pg (ref 26.0–34.0)
MCHC: 32.6 g/dL (ref 32.0–36.0)
MCV: 89.4 fL (ref 80.0–100.0)
Platelets: 198 10*3/uL (ref 150–440)
RBC: 2.56 MIL/uL — ABNORMAL LOW (ref 4.40–5.90)
RDW: 14.6 % — AB (ref 11.5–14.5)
WBC: 32.5 10*3/uL — ABNORMAL HIGH (ref 3.8–10.6)

## 2015-05-22 LAB — BLOOD GAS, ARTERIAL
ACID-BASE EXCESS: 0.1 mmol/L (ref 0.0–3.0)
Allens test (pass/fail): POSITIVE — AB
BICARBONATE: 23.3 meq/L (ref 21.0–28.0)
FIO2: 0.4
LHR: 14 {breaths}/min
MECHVT: 500 mL
Mechanical Rate: 14
O2 SAT: 99.6 %
PATIENT TEMPERATURE: 37
PEEP/CPAP: 5 cmH2O
pCO2 arterial: 32 mmHg (ref 32.0–48.0)
pH, Arterial: 7.47 — ABNORMAL HIGH (ref 7.350–7.450)
pO2, Arterial: 176 mmHg — ABNORMAL HIGH (ref 83.0–108.0)

## 2015-05-22 LAB — URINE CULTURE: Culture: >=100000 — AB

## 2015-05-22 LAB — MAGNESIUM: Magnesium: 2.2 mg/dL (ref 1.7–2.4)

## 2015-05-22 MED ORDER — DIATRIZOATE MEGLUMINE & SODIUM 66-10 % PO SOLN: 15.0000 mL | ORAL | Status: DC

## 2015-05-22 MED ORDER — INSULIN GLARGINE 100 UNIT/ML ~~LOC~~ SOLN
10.0000 [IU] | SUBCUTANEOUS | Status: DC
  Administered 2015-05-22: 10 [IU] via SUBCUTANEOUS
  Filled 2015-05-22: qty 0.1

## 2015-05-22 MED ORDER — VITAL HIGH PROTEIN PO LIQD
1000.0000 mL | ORAL | Status: DC
  Administered 2015-05-22: 1000 mL

## 2015-05-22 MED ORDER — FREE WATER
30.0000 mL | Status: DC
  Administered 2015-05-22 – 2015-05-23 (×7): 30 mL

## 2015-05-22 MED ORDER — MORPHINE SULFATE (PF) 2 MG/ML IV SOLN: 2.0000 mg | INTRAVENOUS | Status: DC | PRN

## 2015-05-22 MED ORDER — VANCOMYCIN HCL IN DEXTROSE 750-5 MG/150ML-% IV SOLN: 750.0000 mg | INTRAVENOUS | Status: DC

## 2015-05-22 MED ORDER — IOPAMIDOL (ISOVUE-300) INJECTION 61%
100.0000 mL | Freq: Once | INTRAVENOUS | Status: AC | PRN
  Administered 2015-05-22: 100 mL via INTRAVENOUS

## 2015-05-22 NOTE — Progress Notes (Signed)
   05/22/15 1900  Clinical Encounter Type  Visited With Patient and family together  Visit Type Initial;Follow-up  Referral From Nurse  Consult/Referral To Chaplain  Spiritual Encounters  Spiritual Needs Prayer;Emotional  Stress Factors  Family Stress Factors Loss  The chaplain was paged to provide support to the patient's wife and daughter who were trying to process news from the medical staff. [name] made the decision that she would not have her husband go through surgery after speaking with the physicians. She did not want her husband to go through what would likely be many procedures.  The chaplain provided a listening presence and support to wife and daughter as they expressed their grief. Chaplain provided tissues, water, phone calls to friends, hugs, and prayer. [name], Jason Aguilar, and daughter, Jason Aguilar, were able to [place] and smile through their tears as they shared their memories of the patient and pictures and stories of their pets with the chaplain.  Chaplain will continue to follow as needed to provide support to family as they hold vigil at the bedside.

## 2015-05-22 NOTE — Progress Notes (Addendum)
[facility] Progess Note    ASSESSMENT/PLAN   80 year old male past medical history of end-stage renal disease on dialysis, recent CVA, hematuria syndrome, GERD,hyperlipidemia, OSA on CPAP, bladder cell carcinoma, diabetes type 2, minute from the ER after being found unresponsive after dialysis with hypoxia. Subsequently suffered cardiac arrest on 4/22 for 8 minutes.  PULMONARY A: VDRF Lethargy Unreponsive COPD P:  -Full vent support; settings reviewed, currently on PRVC/14/500/5/40%. -VAP protocol -ABG -daily CXR, reviewed today is unremarkable, life support tubes in adequate position. position.  CARDIOVASCULAR A:  Elevated troponin-peaked at 0.17-likely demand ischemia H/o Hypertension P:  -Hemodynamics per ICU protocol -Hold antihypertensives 2/2 shock, continue to wean off pressors. -Pressors prn to keep MAP>65  RENAL A:  ESRD on HD P:  -HD per nephrology -Monitor and replace electrolytes  GASTROINTESTINAL A:  H/O GERD P:  -PPI  HEMATOLOGIC A:  H/O bladder cancer s/p TURP Anemia-Chronic disease versus blood loss; HG 7.5 today P:  -Transfuse prn -Monitor CBC  INFECTIOUS A:  ?Sepsis P:  -Empiric abx-on Zosyn Vanco. -F/U cultures  Micro/culture results: BCx2 4/20: Negative. UC 4/20: Positive for Enterococcus faecalis. MRSA screen 4/20: Negative. Sputum--  Antibiotics: Vancomycin 4/21> Zosyn 4/21>   ENDOCRINE A:  T2DM P:  -POCT Glucose monitoring with SSI coverage, insulin drip, wean as tolerated.  NEUROLOGIC A:  ? CVA-initial CT head negative Now with possible anoxic encephalopathy status post cardiac arrest on 4/22. P:  RASS goal: -1 to -2 -Versed and fentanyl for vent sedation -We'll need to repeat CT head and EEG.   MAJOR EVENTS/TEST RESULTS: Cardiac arrest on 4/22, for 8 minutes.  Best Practices Code Status: Full. Diet: NPO GI prophylaxis: PPI. VTE prophylaxis: SCD's  / heparin.  LINES/TUBES: Right Subclavian HD cath  ---------------------------------------   ----------------------------------------   Name: Jason Aguilar MRN: [id number] DOB: 1935-03-20    ADMISSION DATE:  05/22/2015    SUBJECTIVE:   Pt currently on the ventilator, can not provide history or review of systems.     VITAL SIGNS: Temp:  [98.3 F (36.8 C)-99 F (37.2 C)] 98.5 F (36.9 C) (04/23 1945) Pulse Rate:  [55-75] 60 (04/24 0600) Resp:  [14-20] 16 (04/24 0600) BP: (116-164)/(35-49) 123/46 mmHg (04/24 0600) SpO2:  [94 %-100 %] 100 % (04/24 0600) FiO2 (%):  [40 %] 40 % (04/24 0114) Weight:  [125 lb 7.1 oz (56.9 kg)] 125 lb 7.1 oz (56.9 kg) (04/24 0442) HEMODYNAMICS:   VENTILATOR SETTINGS: Vent Mode:  [-] PRVC FiO2 (%):  [40 %] 40 % Set Rate:  [14 bmp] 14 bmp Vt Set:  [500 mL] 500 mL PEEP:  [5 cmH20] 5 cmH20 Plateau Pressure:  [17 cmH20] 17 cmH20 INTAKE / OUTPUT:  Intake/Output Summary (Last 24 hours) at 05/22/15 0839 Last data filed at 05/22/15 0443  Gross per 24 hour  Intake  100.1 ml  Output      2 ml  Net   98.1 ml    PHYSICAL EXAMINATION: Physical Examination:   VS: BP 123/46 mmHg  Pulse 60  Temp(Src) 98.5 F (36.9 C) (Axillary)  Resp 16  Ht 5\' 4"  (1.626 m)  Wt 125 lb 7.1 oz (56.9 kg)  BMI 21.52 kg/m2  SpO2 100%  General Appearance: No distress  Neuro:without focal findings, mental status normal. HEENT: PERRLA, EOM intact. Pulmonary: normal breath sounds   CardiovascularNormal S1,S2.  No m/r/g.   Abdomen: Benign, Soft, non-tender. Renal:  No costovertebral tenderness  GU:  Not performed at this time. Endocrine: No  evident thyromegaly. Skin:   warm, no rashes, no ecchymosis  Extremities: normal, no cyanosis, clubbing.   LABS:   LABORATORY PANEL:   CBC  Recent Labs Lab 05/20/15 2304  WBC 35.5*  HGB 7.9*  HCT 25.4*  PLT 238    Chemistries   Recent Labs Lab 04/29/2015 1726  05/20/15 2304 05/21/15 0758  NA 137  < >  145 143  K 3.2*  < > 3.2* 4.6  CL 97*  < > 100* 102  CO2 26  < > 16* 25  GLUCOSE 144*  < > 213* 285*  BUN 16  < > 34* 46*  CREATININE 1.68*  < > 1.54* 1.79*  CALCIUM 10.0  < > 9.7 9.4  MG  --   < > 2.1 1.9  PHOS  --   --  7.7*  --   AST 50*  --   --   --   ALT 27  --   --   --   ALKPHOS 132*  --   --   --   BILITOT 0.5  --   --   --   < > = values in this interval not displayed.   Recent Labs Lab 05/21/15 2155 05/21/15 2256 05/22/15 0052 05/22/15 0157 05/22/15 0259 05/22/15 0718  GLUCAP 146* 147* 151* 158* 178* 149*    Recent Labs Lab 05/13/2015 2200 05/21/15 0501  PHART 7.54* 7.47*  PCO2ART 32 32  PO2ART 116* 176*    Recent Labs Lab 05/06/2015 1726  AST 50*  ALT 27  ALKPHOS 132*  BILITOT 0.5  ALBUMIN 2.4*    Cardiac Enzymes  Recent Labs Lab 05/19/15 1109  TROPONINI 0.06*    RADIOLOGY:  Dg Chest Port 1 View  05/22/2015  CLINICAL DATA:  80 year old male with acute respiratory failure. Insert follow-up EXAM: PORTABLE CHEST 1 VIEW COMPARISON:  05/21/2015. FINDINGS: Endotracheal tube tip now 2.1 cm above the carina. Left central line tip at the level of formation of the superior vena cava/distal left brachiocephalic vein. Right central line tip at the level of the distal superior vena cava. Nasogastric tube courses below the diaphragm. Tip is not included on the present exam. No gross pneumothorax. No infiltrate, congestive heart failure or pneumothorax. Heart size within normal limits. Calcified slightly tortuous aorta. IMPRESSION: Endotracheal tube tip has been retracted slightly now 2.1 cm above the carina. Remaining findings without change. Electronically Signed   By: Genia Del M.D.   On: 05/22/2015 07:46   Dg Chest Port 1 View  05/21/2015  CLINICAL DATA:  Acute respiratory failure.  , cardiac arrest EXAM: PORTABLE CHEST 1 VIEW COMPARISON:  Radiograph 05/20/2015 FINDINGS: Endotracheal tube is low measuring 6 mm from the carina. No significant change. LEFT  and RIGHT central venous lines are unchanged. External pads are noted. No pulmonary edema. No pneumothorax. IMPRESSION: 1. No significant interval change. 2. Stable support apparatus. 3. Endotracheal tube remains just above the carina. Electronically Signed   By: Suzy Bouchard M.D.   On: 05/21/2015 09:17       --Marda Stalker, MD.  ICU Pager: [phone] [facility] Office Number: [phone]  Patricia Pesa, M.D.  Vilinda Boehringer, M.D.  Merton Border, M.D  05/22/2015  Critical Care Attestation.  I have personally obtained a history, examined the patient, evaluated laboratory and imaging results, formulated the assessment and plan and placed orders. The Patient requires high complexity decision making for assessment and support, frequent evaluation and titration of therapies, application of  advanced monitoring technologies and extensive interpretation of multiple databases. The patient has critical illness that could lead imminently to failure of 1 or more organ systems and requires the highest level of physician preparedness to intervene.  Critical Care Time devoted to patient care services described in this note is 35 minutes and is exclusive of time spent in procedures.  Addendum 5:49 pm;   Patient was discussed with urology, and ICU RN regarding the patient's skin changes on the scrotum. The patient was subsequent sent for a stat CT of the abdomen and pelvis as well as the CT of the head due to diminished responsiveness. CT of the head upon review was unremarkable, subsequently, on review of the CT of the abdomen and pelvis with Dr. Erlene Quan, findings were significant for gangrenous changes in the pelvis tracking along the pelvic floor. These likely explains the etiology of the patient's shock. I reviewed and discussed the results with the patient's wife and daughter at bedside, they were very tearful and upset, understandably. I explained that these findings  represent an emergent condition, that must be addressed quickly. They would need to make a decision regarding surgery at this time. I also discussed that given his recent cardiac arrest, renal failure and overall poor state, his chances of making it through this surgery are very poor. The patient discussed this further with general surgery, as well as urology. I discussed it again with them afterwards, and they agreed that the best course of action is not put the patient through any further pain and suffering for a port to nail chance of any meaningful recovery. At present, he remains unresponsive with likely some degree of anoxic brain injury. Therefore, patient's family is agreeable to making the patient DO NOT RESUSCITATE, and we will place orders for a terminal wean/terminal extubation.  Marda Stalker M.D. 05/22/2015  Critical Care Attestation.  I have personally obtained a history, examined the patient, evaluated laboratory and imaging results, formulated the assessment and plan and placed orders. The Patient requires high complexity decision making for assessment and support, frequent evaluation and titration of therapies, application of advanced monitoring technologies and extensive interpretation of multiple databases. The patient has critical illness that could lead imminently to failure of 1 or more organ systems and requires the highest level of physician preparedness to intervene.  Critical Care Time devoted to patient care services described in this note is 65 minutes and is exclusive of time spent in procedures.

## 2015-05-22 NOTE — Progress Notes (Signed)
Tube feeds stopped secondary to CT of abdomen results.

## 2015-05-22 NOTE — Care Management (Signed)
PEA arrest 4/22. Remains vented. Anoxic Encephalopathy. Following progression.

## 2015-05-22 NOTE — Consult Note (Signed)
Surgical Consultation  05/22/2015  Jason Aguilar is an 80 y.o. male.   CC: Necrotizing perineal infection  HPI: This patient with multiple medical problems on a ventilator who I was asked to see by Dr. 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 History is obtained in discussion with Dr. Erlene Quan as well as from the chart and the CT scan is personally reviewed and the patient is examined. He is on a ventilator unresponsive after an MI recently family is present. Unable to obtain a review of systems due to the patient's acuity and ventilator status  Past Medical History  Diagnosis Date  . Glaucoma     both eyes Drs. Arrie Eastern and [place]  . Hematuria syndrome     s/p negative cystoscopy  . GERD (gastroesophageal reflux disease) 2003    EGD  . Allergy   . Hyperlipidemia   . Obstructive sleep apnea of adult     CPAP at 8 cm H20  . Noncompliance with medication regimen   . 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 by [facility]: low grade noninvasive  . Chronic kidney disease (CKD), stage III (moderate)     with proteinuria  . Glaucoma   . Hypertension   . Diabetes mellitus     Type 2  . Cataract due to secondary diabetes mellitus ([place])   . COPD (chronic obstructive pulmonary disease) ([place])     secondary to tobacco abuse  . History of tobacco abuse     60 pack year history; quit 1980    Past Surgical History  Procedure Laterality Date  . Elbow surgery    . Transurethral resection of bladder tumor  Mar 13 2011    [facility]  . Peripheral vascular catheterization N/A 01/03/2015    Procedure: Dialysis/Perma Catheter Insertion;  Surgeon: Katha Cabal, MD;  Location: [facility];  Service: Cardiovascular;  Laterality: N/A;  . Skin graft    . Cataract extraction      Family History  Problem Relation Age of Onset  . Diabetes Mother   . Heart disease Father     Social History:  reports that he quit smoking  about 34 years ago. He has never used smokeless tobacco. He reports that he drinks about 1.8 oz of alcohol per week. He reports that he does not use illicit drugs.  Allergies:  Allergies  Allergen Reactions  . Losartan Swelling  . Dristan Rash    Medications reviewed.   Review of Systems:   Review of Systems  Unable to perform ROS: acuity of condition     Physical Exam:  BP 135/51 mmHg  Pulse 63  Temp(Src) 98.3 F (36.8 C) (Oral)  Resp 14  Ht _0  (1.626 m)  Wt 125 lb 7.1 oz (56.9 kg)  BMI 21.52 kg/m2  SpO2 100%  Physical Exam  Constitutional:  On ventilator and unresponsive  HENT:  Head: Normocephalic and atraumatic.  Neck:  Endotracheally intubated  Cardiovascular: Normal rate and regular rhythm.   Pulmonary/Chest:  On vent  Abdominal: Soft. He exhibits no distension. There is no tenderness. There is no rebound and no guarding.  Patient is unresponsive to deep palpation even in the pelvis knowing his CT findings he is completely unresponsive to deep palpation  Genitourinary: Penis normal.  Extensive edema and induration of the scrotum with bullae in the inferior portion of the scrotum with discoloration and erythema  Neurological:  Unresponsive on ventilator without  sedation  Skin: Skin is warm. There is erythema.  Vitals reviewed.     Results for orders placed or performed during the hospital encounter of 05/12/2015 (from the past 48 hour(s))  Hepatitis B surface antigen     Status: None   Collection Time: 05/20/15  6:43 PM  Result Value Ref Range   Hepatitis B Surface Ag Negative Negative    Comment: (NOTE) Performed At: [facility] [place], [place] [id number] Lindon Romp MD RC:[id number]   Glucose, capillary     Status: Abnormal   Collection Time: 05/20/15  8:24 PM  Result Value Ref Range   Glucose-Capillary 136 (H) 65 - 99 mg/dL  Basic metabolic panel     Status: Abnormal   Collection Time: 05/20/15 11:04 PM   Result Value Ref Range   Sodium 145 135 - 145 mmol/L    Comment: LYTES REPEATED   Potassium 3.2 (L) 3.5 - 5.1 mmol/L   Chloride 100 (L) 101 - 111 mmol/L   CO2 16 (L) 22 - 32 mmol/L   Glucose, Bld 213 (H) 65 - 99 mg/dL   BUN 34 (H) 6 - 20 mg/dL   Creatinine, Ser 1.54 (H) 0.61 - 1.24 mg/dL   Calcium 9.7 8.9 - 10.3 mg/dL   GFR calc non Af Amer 42 (L) >60 mL/min   GFR calc Af Amer 48 (L) >60 mL/min    Comment: (NOTE) The eGFR has been calculated using the CKD EPI equation. This calculation has not been validated in all clinical situations. eGFR's persistently <60 mL/min signify possible Chronic Kidney Disease.    Anion gap 29 (H) 5 - 15  Magnesium     Status: None   Collection Time: 05/20/15 11:04 PM  Result Value Ref Range   Magnesium 2.1 1.7 - 2.4 mg/dL  Phosphorus     Status: Abnormal   Collection Time: 05/20/15 11:04 PM  Result Value Ref Range   Phosphorus 7.7 (H) 2.5 - 4.6 mg/dL  CBC with Differential/Platelet     Status: Abnormal   Collection Time: 05/20/15 11:04 PM  Result Value Ref Range   WBC 35.5 (H) 3.8 - 10.6 K/uL   RBC 2.71 (L) 4.40 - 5.90 MIL/uL   Hemoglobin 7.9 (L) 13.0 - 18.0 g/dL   HCT 25.4 (L) 40.0 - 52.0 %   MCV 93.5 80.0 - 100.0 fL   MCH 29.3 26.0 - 34.0 pg   MCHC 31.3 (L) 32.0 - 36.0 g/dL   RDW 14.7 (H) 11.5 - 14.5 %   Platelets 238 150 - 440 K/uL   Neutrophils Relative % 76 %   Neutro Abs 32.3 (H) 1.4 - 6.5 K/uL   Lymphocytes Relative 7 %   Lymphs Abs 2.5 1.0 - 3.6 K/uL   Monocytes Relative 2 %   Monocytes Absolute 0.7 0.2 - 1.0 K/uL   Eosinophils Relative 0 %   Eosinophils Absolute 0.0 0 - 0.7 K/uL   Basophils Relative 0 %   Basophils Absolute 0.0 0 - 0.1 K/uL   Other 0 %   nRBC 0 0 /100 WBC   Band Neutrophils 8 %   Metamyelocytes Relative 3 %   Myelocytes 2 %   Promyelocytes Absolute 2 %   Blasts 0 %  Lactic acid, plasma     Status: Abnormal   Collection Time: 05/20/15 11:04 PM  Result Value Ref Range   Lactic Acid, Venous 15.7 (HH) 0.5  - 2.0 mmol/L    Comment: CRITICAL RESULT CALLED TO,  READ BACK BY AND VERIFIED WITH MICHELLE WILLIAMS AT 0014 05/21/15.PMH RESULT CONFIRMED BY MANUAL DILUTION   APTT     Status: Abnormal   Collection Time: 05/20/15 11:04 PM  Result Value Ref Range   aPTT 45 (H) 24 - 36 seconds    Comment:        IF BASELINE aPTT IS ELEVATED, SUGGEST PATIENT RISK ASSESSMENT BE USED TO DETERMINE APPROPRIATE ANTICOAGULANT THERAPY.   Protime-INR     Status: Abnormal   Collection Time: 05/20/15 11:04 PM  Result Value Ref Range   Prothrombin Time 15.7 (H) 11.4 - 15.0 seconds   INR 1.23   Glucose, capillary     Status: Abnormal   Collection Time: 05/20/15 11:39 PM  Result Value Ref Range   Glucose-Capillary 179 (H) 65 - 99 mg/dL  Lactic acid, plasma     Status: Abnormal   Collection Time: 05/21/15  3:08 AM  Result Value Ref Range   Lactic Acid, Venous 9.9 (HH) 0.5 - 2.0 mmol/L    Comment: CRITICAL RESULT CALLED TO, READ BACK BY AND VERIFIED WITH BRITTONLEE RUSTCHESTER AT 8546 05/21/15.PMH  Glucose, capillary     Status: Abnormal   Collection Time: 05/21/15  3:36 AM  Result Value Ref Range   Glucose-Capillary 247 (H) 65 - 99 mg/dL  Blood gas, arterial     Status: Abnormal   Collection Time: 05/21/15  5:01 AM  Result Value Ref Range   FIO2 0.40    Delivery systems VENTILATOR    Mode PRESSURE REGULATED VOLUME CONTROL    VT 500 mL   LHR 14 resp/min   Peep/cpap 5.0 cm H20   pH, Arterial 7.47 (H) 7.350 - 7.450   pCO2 arterial 32 32.0 - 48.0 mmHg   pO2, Arterial 176 (H) 83.0 - 108.0 mmHg   Bicarbonate 23.3 21.0 - 28.0 mEq/L   Acid-Base Excess 0.1 0.0 - 3.0 mmol/L   O2 Saturation 99.6 %   Patient temperature 37.0    Collection site RIGHT BRACHIAL    Sample type ARTERIAL DRAW    Allens test (pass/fail) POSITIVE (A) PASS   Mechanical Rate 14   Glucose, capillary     Status: Abnormal   Collection Time: 05/21/15  7:07 AM  Result Value Ref Range   Glucose-Capillary 251 (H) 65 - 99 mg/dL  Basic  metabolic panel     Status: Abnormal   Collection Time: 05/21/15  7:58 AM  Result Value Ref Range   Sodium 143 135 - 145 mmol/L   Potassium 4.6 3.5 - 5.1 mmol/L   Chloride 102 101 - 111 mmol/L   CO2 25 22 - 32 mmol/L   Glucose, Bld 285 (H) 65 - 99 mg/dL   BUN 46 (H) 6 - 20 mg/dL   Creatinine, Ser 1.79 (H) 0.61 - 1.24 mg/dL   Calcium 9.4 8.9 - 10.3 mg/dL   GFR calc non Af Amer 35 (L) >60 mL/min   GFR calc Af Amer 40 (L) >60 mL/min    Comment: (NOTE) The eGFR has been calculated using the CKD EPI equation. This calculation has not been validated in all clinical situations. eGFR's persistently <60 mL/min signify possible Chronic Kidney Disease.    Anion gap 16 (H) 5 - 15  Magnesium     Status: None   Collection Time: 05/21/15  7:58 AM  Result Value Ref Range   Magnesium 1.9 1.7 - 2.4 mg/dL  Lactic acid, plasma     Status: Abnormal   Collection Time: 05/21/15  7:58 AM  Result Value Ref Range   Lactic Acid, Venous 2.4 (HH) 0.5 - 2.0 mmol/L    Comment: CRITICAL RESULT CALLED TO, READ BACK BY AND VERIFIED WITH BRITTNEY WOOD ON 05/21/15 AT 0858 [facility]   Glucose, capillary     Status: Abnormal   Collection Time: 05/21/15  9:02 AM  Result Value Ref Range   Glucose-Capillary 271 (H) 65 - 99 mg/dL  Glucose, capillary     Status: Abnormal   Collection Time: 05/21/15 10:03 AM  Result Value Ref Range   Glucose-Capillary 213 (H) 65 - 99 mg/dL  Glucose, capillary     Status: Abnormal   Collection Time: 05/21/15 11:01 AM  Result Value Ref Range   Glucose-Capillary 257 (H) 65 - 99 mg/dL  Glucose, capillary     Status: Abnormal   Collection Time: 05/21/15  1:22 PM  Result Value Ref Range   Glucose-Capillary 163 (H) 65 - 99 mg/dL  Glucose, capillary     Status: Abnormal   Collection Time: 05/21/15  3:11 PM  Result Value Ref Range   Glucose-Capillary 118 (H) 65 - 99 mg/dL  Glucose, capillary     Status: Abnormal   Collection Time: 05/21/15  5:24 PM  Result Value Ref Range    Glucose-Capillary 137 (H) 65 - 99 mg/dL  Glucose, capillary     Status: Abnormal   Collection Time: 05/21/15  6:50 PM  Result Value Ref Range   Glucose-Capillary 147 (H) 65 - 99 mg/dL  Glucose, capillary     Status: Abnormal   Collection Time: 05/21/15  7:58 PM  Result Value Ref Range   Glucose-Capillary 147 (H) 65 - 99 mg/dL  Glucose, capillary     Status: Abnormal   Collection Time: 05/21/15  9:01 PM  Result Value Ref Range   Glucose-Capillary 146 (H) 65 - 99 mg/dL  Glucose, capillary     Status: Abnormal   Collection Time: 05/21/15  9:55 PM  Result Value Ref Range   Glucose-Capillary 146 (H) 65 - 99 mg/dL  Glucose, capillary     Status: Abnormal   Collection Time: 05/21/15 10:56 PM  Result Value Ref Range   Glucose-Capillary 147 (H) 65 - 99 mg/dL  Glucose, capillary     Status: Abnormal   Collection Time: 05/22/15 12:52 AM  Result Value Ref Range   Glucose-Capillary 151 (H) 65 - 99 mg/dL  Glucose, capillary     Status: Abnormal   Collection Time: 05/22/15  1:57 AM  Result Value Ref Range   Glucose-Capillary 158 (H) 65 - 99 mg/dL  Glucose, capillary     Status: Abnormal   Collection Time: 05/22/15  2:59 AM  Result Value Ref Range   Glucose-Capillary 178 (H) 65 - 99 mg/dL  Glucose, capillary     Status: Abnormal   Collection Time: 05/22/15  7:18 AM  Result Value Ref Range   Glucose-Capillary 149 (H) 65 - 99 mg/dL  CBC     Status: Abnormal   Collection Time: 05/22/15  8:39 AM  Result Value Ref Range   WBC 32.5 (H) 3.8 - 10.6 K/uL   RBC 2.56 (L) 4.40 - 5.90 MIL/uL   Hemoglobin 7.5 (L) 13.0 - 18.0 g/dL   HCT 22.9 (L) 40.0 - 52.0 %   MCV 89.4 80.0 - 100.0 fL   MCH 29.1 26.0 - 34.0 pg   MCHC 32.6 32.0 - 36.0 g/dL   RDW 14.6 (H) 11.5 - 14.5 %   Platelets 198 150 - 440 K/uL  Basic metabolic panel     Status: Abnormal   Collection Time: 05/22/15  8:39 AM  Result Value Ref Range   Sodium 144 135 - 145 mmol/L   Potassium 5.0 3.5 - 5.1 mmol/L   Chloride 104 101 - 111  mmol/L   CO2 29 22 - 32 mmol/L   Glucose, Bld 172 (H) 65 - 99 mg/dL   BUN 79 (H) 6 - 20 mg/dL   Creatinine, Ser 2.64 (H) 0.61 - 1.24 mg/dL   Calcium 9.9 8.9 - 10.3 mg/dL   GFR calc non Af Amer 22 (L) >60 mL/min   GFR calc Af Amer 25 (L) >60 mL/min    Comment: (NOTE) The eGFR has been calculated using the CKD EPI equation. This calculation has not been validated in all clinical situations. eGFR's persistently <60 mL/min signify possible Chronic Kidney Disease.    Anion gap 11 5 - 15  Magnesium     Status: None   Collection Time: 05/22/15  8:39 AM  Result Value Ref Range   Magnesium 2.2 1.7 - 2.4 mg/dL  Glucose, capillary     Status: Abnormal   Collection Time: 05/22/15 11:50 AM  Result Value Ref Range   Glucose-Capillary 154 (H) 65 - 99 mg/dL   Ct Head Wo Contrast  05/22/2015  CLINICAL DATA:  Decreasing mental status EXAM: CT HEAD WITHOUT CONTRAST TECHNIQUE: Contiguous axial images were obtained from the base of the skull through the vertex without intravenous contrast. COMPARISON:  05/13/2015 FINDINGS: Diffuse atrophic and chronic white matter ischemic changes are noted similar to that seen on the prior exam. Some calcification of the tentorium cerebelli is noted. No findings to suggest acute hemorrhage, acute infarction or space-occupying mass lesion are noted. IMPRESSION: No acute abnormality noted. Chronic atrophic and ischemic changes. Electronically Signed   By: Inez Catalina M.D.   On: 05/22/2015 16:15   Ct Abdomen Pelvis W Contrast  05/22/2015  CLINICAL DATA:  Decreased mental status. Status post code on 04/22. Pus coming from rectum EXAM: CT ABDOMEN AND PELVIS WITH CONTRAST TECHNIQUE: Multidetector CT imaging of the abdomen and pelvis was performed using the standard protocol following bolus administration of intravenous contrast. CONTRAST:  158m ISOVUE-300 IOPAMIDOL (ISOVUE-300) INJECTION 61% COMPARISON:  06/09/2007. FINDINGS: Lower chest: Dependent changes are identified in the  lung bases posteriorly. No pleural or pericardial effusion. No airspace consolidation identified. Hepatobiliary: Stone within the neck of gallbladder measure 1 cm, image 25 of series 3. No suspicious liver abnormality. Pancreas: No inflammation or mass identified. Spleen: The spleen appears normal. Adrenals/Urinary Tract: Bilateral adrenal nodules have been present dating back to 05/29/07 and are favored to represent benign adenomas. Unremarkable appearance of the left kidney. Cyst within the right kidney measures 2.8 cm, image 42 of series 3. Circumferential wall thickening involving the urinary bladder is identified. Stomach/Bowel: There is an nasogastric tube within the stomach. The small bowel loops have a normal caliber. There is no pathologic dilatation of the colon. Circumferential wall thickening and mucosal enhancement involving the rectum is noted, image 84 series 3. Vascular/Lymphatic: Aortic atherosclerosis noted. No aneurysm. 7 mm aortocaval node is identified, image 42 of series 3. Reproductive: There is diffuse scrotal edema. Scrotal wall thickness measures up to 1.6 cm, image 111 of series 3. Gas is identified within the left hemiscrotum, image 100 of series 3. This extends into the base of scrotum and up into the perineum. There is a gas and fluid collection within the left floor of pelvis which measures 5.5  x 2.5 x 6.6 cm. This has mass effect upon the rectum and prostate gland. The gas within the floor of pelvis extends into the left pelvic sidewall, image 78 of series 3 and into the left iliac fossa, image 75 of series 3. Other: As above. Musculoskeletal: No aggressive lytic or sclerotic bone lesions identified. Degenerative disc disease is noted within the lumbar spine. IMPRESSION: 1. Examination is positive for scrotal gangrene. Fluid and gas tracks from the left hemiscrotum into the floor of pelvis, along the left pelvic sidewall and into the left iliac fossa. Gas and fluid collections exert  mass effect upon the left side of rectum as well as the prostate gland. Critical Value/emergent results were called by telephone at the time of interpretation on 05/22/2015 at 4:58 pm to Dr. Hollice Espy , who verbally acknowledged these results. 2. Aortic atherosclerosis 3. Bilateral adrenal nodules, likely benign adenomas. 4. Gallstone. Electronically Signed   By: Kerby Moors M.D.   On: 05/22/2015 17:00   Dg Chest Port 1 View  05/22/2015  CLINICAL DATA:  80 year old male with acute respiratory failure. Insert follow-up EXAM: PORTABLE CHEST 1 VIEW COMPARISON:  05/21/2015. FINDINGS: Endotracheal tube tip now 2.1 cm above the carina. Left central line tip at the level of formation of the superior vena cava/distal left brachiocephalic vein. Right central line tip at the level of the distal superior vena cava. Nasogastric tube courses below the diaphragm. Tip is not included on the present exam. No gross pneumothorax. No infiltrate, congestive heart failure or pneumothorax. Heart size within normal limits. Calcified slightly tortuous aorta. IMPRESSION: Endotracheal tube tip has been retracted slightly now 2.1 cm above the carina. Remaining findings without change. Electronically Signed   By: Genia Del M.D.   On: 05/22/2015 07:46   Dg Chest Port 1 View  05/21/2015  CLINICAL DATA:  Acute respiratory failure.  , cardiac arrest EXAM: PORTABLE CHEST 1 VIEW COMPARISON:  Radiograph 05/20/2015 FINDINGS: Endotracheal tube is low measuring 6 mm from the carina. No significant change. LEFT and RIGHT central venous lines are unchanged. External pads are noted. No pulmonary edema. No pneumothorax. IMPRESSION: 1. No significant interval change. 2. Stable support apparatus. 3. Endotracheal tube remains just above the carina. Electronically Signed   By: Suzy Bouchard M.D.   On: 05/21/2015 09:17    Assessment/Plan:  This a patient with multiple medical problems who now has a necrotizing process of the perineum  extending up into the pelvis along the rectum. This may have started as a perirectal abscess it is not clear. She has been personally reviewed and the patient was seen with Dr. Erlene Quan and examined with Dr. Erlene Quan. A long discussion was held with the patient's family concerning his status and the severity of this disease as well as the need for emergent surgery and the need for prolonged ICU care on a ventilator with open wounds and continued multiple operations on a daily basis which would also include a colostomy. We also discussed skin loss and a large open wound that may require skin grafting should he survive. Her graft the family ultimately decided that it would not be in the patient's wishes to continue with this sort of care and surgery would not be performed at this point. Dr. Erlene Quan I discussed this with the nursing staff as well as the ICU medical staff and suggested a palliative care consult for comfort care. DO NOT RESUSCITATE status will be addressed by ICU medical staff  Florene Glen, MD, FACS

## 2015-05-22 NOTE — Progress Notes (Signed)
[facility]  ROUNDING NOTE   Subjective:   Critically illl at this time Intubated sedated, vent dependent TF 20 cc/hr No pressors  Objective:  Vital signs in last 24 hours:  Temp:  [98.3 F (36.8 C)-99 F (37.2 C)] 98.5 F (36.9 C) (04/24 0800) Pulse Rate:  [53-75] 65 (04/24 1000) Resp:  [14-20] 14 (04/24 1000) BP: (116-164)/(38-49) 130/49 mmHg (04/24 1000) SpO2:  [94 %-100 %] 100 % (04/24 1000) FiO2 (%):  [40 %] 40 % (04/24 1000) Weight:  [56.9 kg (125 lb 7.1 oz)] 56.9 kg (125 lb 7.1 oz) (04/24 0442)  Weight change: -0.2 kg (-7.1 oz) Filed Weights   05/20/15 1840 05/21/15 0433 05/22/15 0442  Weight: 57.1 kg (125 lb 14.1 oz) 59 kg (130 lb 1.1 oz) 56.9 kg (125 lb 7.1 oz)    Intake/Output: I/O last 3 completed shifts: In: 2262.8 [I.V.:942.8; NG/GT:270; IV Piggyback:1050] Out: -71 [Stool:2]   Intake/Output this shift:  Total I/O In: 167.4 [I.V.:67.4; IV Piggyback:100] Out: -   Physical Exam: General: Critically ill  Head: +ETT, +OGT  Eyes: Eyes closed  Neck: Supple, trachea midline  Lungs:  Course bilaterally, PRVC 40%  Heart: Regular rate and rhythm  Abdomen:  Soft, nontender  Extremities: no peripheral edema.  Neurologic: Intubated, sedated  Skin: No lesions  Access: RIJ permcath    Basic Metabolic Panel:  Recent Labs Lab 05/19/15 0437 05/20/15 0503 05/20/15 2304 05/21/15 0758 05/22/15 0839  NA 139 139 145 143 144  K 3.7 4.1 3.2* 4.6 5.0  CL 103 102 100* 102 104  CO2 27 26 16* 25 29  GLUCOSE 160* 151* 213* 285* 172*  BUN 33* 72* 34* 46* 79*  CREATININE 1.85* 2.49* 1.54* 1.79* 2.64*  CALCIUM 10.1 10.3 9.7 9.4 9.9  MG 1.7  --  2.1 1.9 2.2  PHOS  --   --  7.7*  --   --     Liver Function Tests:  Recent Labs Lab 05/07/2015 1726  AST 50*  ALT 27  ALKPHOS 132*  BILITOT 0.5  PROT 6.3*  ALBUMIN 2.4*   No results for input(s): LIPASE, AMYLASE in the last 168 hours. No results for input(s): AMMONIA in the last 168  hours.  CBC:  Recent Labs Lab 05/26/2015 1726 05/19/15 0437 05/20/15 0503 05/20/15 2304 05/22/15 0839  WBC 35.1* 26.3* 24.1* 35.5* 32.5*  NEUTROABS 33.0*  --   --  32.3*  --   HGB 9.5* 8.1* 7.5* 7.9* 7.5*  HCT 29.2* 24.7* 23.6* 25.4* 22.9*  MCV 89.6 88.6 91.8 93.5 89.4  PLT 329 262 223 238 198    Cardiac Enzymes:  Recent Labs Lab 04/29/2015 1726 05/01/2015 2225 05/19/15 0233 05/19/15 1109  CKMB  --  10.0* 12.9* 1.3  TROPONINI 0.25* 0.07* 0.17* 0.06*    BNP: Invalid input(s): POCBNP  CBG:  Recent Labs Lab 05/21/15 2256 05/22/15 0052 05/22/15 0157 05/22/15 0259 05/22/15 0718  GLUCAP 147* 151* 158* 178* 149*    Microbiology: Results for orders placed or performed during the hospital encounter of 05/10/2015  Urine culture     Status: Abnormal   Collection Time: 05/27/2015  5:26 PM  Result Value Ref Range Status   Specimen Description URINE, RANDOM  Final   Special Requests NONE  Final   Culture >=100,000 COLONIES/mL ENTEROCOCCUS FAECALIS (A)  Final   Report Status 05/22/2015 FINAL  Final   Organism ID, Bacteria ENTEROCOCCUS FAECALIS (A)  Final      Susceptibility   Enterococcus faecalis -  MIC*    AMPICILLIN <=2 SENSITIVE Sensitive     LEVOFLOXACIN 0.5 SENSITIVE Sensitive     NITROFURANTOIN <=16 SENSITIVE Sensitive     VANCOMYCIN 1 SENSITIVE Sensitive     LINEZOLID 2 SENSITIVE Sensitive     * >=100,000 COLONIES/mL ENTEROCOCCUS FAECALIS  Blood Culture (routine x 2)     Status: None (Preliminary result)   Collection Time: 05/26/2015  6:13 PM  Result Value Ref Range Status   Specimen Description BLOOD LEFT ARM  Final   Special Requests BOTTLES DRAWN AEROBIC AND ANAEROBIC [place]  Final   Culture NO GROWTH 3 DAYS  Final   Report Status PENDING  Incomplete  Blood Culture (routine x 2)     Status: None (Preliminary result)   Collection Time: 05/24/2015  6:13 PM  Result Value Ref Range Status   Specimen Description BLOOD RIGHT ASSIST CONTROL  Final   Special Requests  BOTTLES DRAWN AEROBIC AND ANAEROBIC 8CC  Final   Culture NO GROWTH 3 DAYS  Final   Report Status PENDING  Incomplete  MRSA PCR Screening     Status: None   Collection Time: 05/12/2015 10:29 PM  Result Value Ref Range Status   MRSA by PCR NEGATIVE NEGATIVE Final    Comment:        The GeneXpert MRSA Assay (FDA approved for NASAL specimens only), is one component of a comprehensive MRSA colonization surveillance program. It is not intended to diagnose MRSA infection nor to guide or monitor treatment for MRSA infections.     Coagulation Studies:  Recent Labs  05/20/15 2304  LABPROT 15.7*  INR 1.23    Urinalysis: No results for input(s): COLORURINE, LABSPEC, PHURINE, GLUCOSEU, HGBUR, BILIRUBINUR, KETONESUR, PROTEINUR, UROBILINOGEN, NITRITE, LEUKOCYTESUR in the last 72 hours.  Invalid input(s): APPERANCEUR    Imaging: Dg Chest Port 1 View  05/22/2015  CLINICAL DATA:  79 year old male with acute respiratory failure. Insert follow-up EXAM: PORTABLE CHEST 1 VIEW COMPARISON:  05/21/2015. FINDINGS: Endotracheal tube tip now 2.1 cm above the carina. Left central line tip at the level of formation of the superior vena cava/distal left brachiocephalic vein. Right central line tip at the level of the distal superior vena cava. Nasogastric tube courses below the diaphragm. Tip is not included on the present exam. No gross pneumothorax. No infiltrate, congestive heart failure or pneumothorax. Heart size within normal limits. Calcified slightly tortuous aorta. IMPRESSION: Endotracheal tube tip has been retracted slightly now 2.1 cm above the carina. Remaining findings without change. Electronically Signed   By: Genia Del M.D.   On: 05/22/2015 07:46   Dg Chest Port 1 View  05/21/2015  CLINICAL DATA:  Acute respiratory failure.  , cardiac arrest EXAM: PORTABLE CHEST 1 VIEW COMPARISON:  Radiograph 05/20/2015 FINDINGS: Endotracheal tube is low measuring 6 mm from the carina. No significant  change. LEFT and RIGHT central venous lines are unchanged. External pads are noted. No pulmonary edema. No pneumothorax. IMPRESSION: 1. No significant interval change. 2. Stable support apparatus. 3. Endotracheal tube remains just above the carina. Electronically Signed   By: Suzy Bouchard M.D.   On: 05/21/2015 09:17     Medications:   . feeding supplement (VITAL HIGH PROTEIN) 1,000 mL (05/22/15 1040)  . fentaNYL infusion INTRAVENOUS Stopped (05/22/15 1054)  . norepinephrine (LEVOPHED) Adult infusion Stopped (05/22/15 0800)   . antiseptic oral rinse  7 mL Mouth Rinse 10 times per day  . chlorhexidine gluconate (SAGE KIT)  15 mL Mouth Rinse BID  . free water  30 mL Per Tube Q4H  . heparin subcutaneous  5,000 Units Subcutaneous Q8H  . pantoprazole (PROTONIX) IV  40 mg Intravenous Q24H  . piperacillin-tazobactam (ZOSYN)  IV  3.375 g Intravenous Q12H  . senna  1 tablet Oral Daily  . vancomycin  500 mg Intravenous Q T,Th,Sa-HD   fentaNYL, hydrALAZINE, metoprolol, midazolam  Assessment/ Plan:  Jason Aguilar is a 80 y.o. white male 80 y.o. 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   1. ESRD: TTS through [place].   - will plan for routine treatment tuesday  2. Anemia of chronic kidney disease:  Hemoglobin 7.5 - epo with treatment  3. Secondary Hyperparathyroidism: with hypercalcemia on admission.  - he is on calcium acetate for binding as outpatient.   4. Diabetes Mellitus type II: with chronic kidney disease: hemoglobin A1c of 11.2% on 03/29/2015.    5. Acute respiratory failure - vent dependent at present    LOS: 4 Bernis Stecher 4/24/201711:21 AM

## 2015-05-22 NOTE — Progress Notes (Signed)
Chaplain rounded the unit and provided a compassionate presence and spiritual support to the patient with silent prayer.Minerva Fester [phone]

## 2015-05-22 NOTE — Consult Note (Signed)
Urology Consult  I have been asked to see the patient by Dr Ashby Dawes.  for evaluation and management of scrotal redness/ swelling.  Chief Complaint: Scrotal redness/ swelling  History of Present Illness: Jason Aguilar is a 80 y.o. male with end-stage renal disease on dialysis, DM, recent CVA, recent cardiac arrest 2 days ago who since been unresponsive (fentanyl drip off since early this a.m. responds only to painful stimuli). with scrotal swelling/erythema. Per his nurse, this may have been present for a few days but appears to be progressing.  He has been on empiric vancomycin/Zosyn.  Exam at bedside today around 3 PM highly concerning for Fournier's gangrene. Patient underwent stat CT abdomen and pelvis with contrast indicating significant necrotizing fasciitis, gas-forming infection involving the scrotum/ perineum tracking up the left pelvic sidewall possibly with perirectal abscess/involvement with lateral deviation of his prostate.   Family and chaplain at the bedside.  Case was discussed extensively with Dr. Burt Knack of general surgery as well as Dr. Ashby Dawes.    Past Medical History  Diagnosis Date  . Glaucoma     both eyes Drs. Arrie [place] and [place]  . Hematuria syndrome     s/p negative cystoscopy  . GERD (gastroesophageal reflux disease) 2003    EGD  . Allergy   . Hyperlipidemia   . Obstructive sleep apnea of adult     CPAP at 8 cm H20  . Noncompliance with medication regimen   . 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 by [facility]: low grade noninvasive  . Chronic kidney disease (CKD), stage III (moderate)     with proteinuria  . Glaucoma   . Hypertension   . Diabetes mellitus     Type 2  . Cataract due to secondary diabetes mellitus ([place])   . COPD (chronic obstructive pulmonary disease) ([place])     secondary to tobacco abuse  . History of tobacco abuse     60 pack year history; quit 1980    Past Surgical History  Procedure Laterality Date    . Elbow surgery    . Transurethral resection of bladder tumor  Mar 13 2011    [facility]  . Peripheral vascular catheterization N/A 01/03/2015    Procedure: Dialysis/Perma Catheter Insertion;  Surgeon: Katha Cabal, MD;  Location: [facility];  Service: Cardiovascular;  Laterality: N/A;  . Skin graft    . Cataract extraction      Home Medications:    Medication List    ASK your doctor about these medications        amLODipine 5 MG tablet  Commonly known as:  NORVASC  Take 5 mg by mouth daily.     aspirin 81 MG chewable tablet  Chew 1 tablet (81 mg total) by mouth daily.     calcium acetate 667 MG capsule  Commonly known as:  PHOSLO  Take 2 capsules (1,334 mg total) by mouth 3 (three) times daily with meals.     cloNIDine 0.1 MG tablet  Commonly known as:  CATAPRES  Take 1 tablet (0.1 mg total) by mouth 2 (two) times daily.     clopidogrel 75 MG tablet  Commonly known as:  PLAVIX  Take 1 tablet (75 mg total) by mouth daily.     doxazosin 8 MG tablet  Commonly known as:  CARDURA  Take 8 mg by mouth at bedtime.     hydrALAZINE 100 MG tablet  Commonly known as:  APRESOLINE  Take  100 mg by mouth 3 (three) times daily.     insulin aspart 100 UNIT/ML injection  Commonly known as:  novoLOG  Inject 2 Units into the skin 3 (three) times daily before meals.     insulin detemir 100 UNIT/ML injection  Commonly known as:  LEVEMIR  Inject 0.15 mLs (15 Units total) into the skin daily.     metoprolol 50 MG tablet  Commonly known as:  LOPRESSOR  Take 50 mg by mouth 2 (two) times daily.     simvastatin 40 MG tablet  Commonly known as:  ZOCOR  Take 40 mg by mouth at bedtime.     sodium bicarbonate 650 MG tablet  Take 650 mg by mouth 2 (two) times daily.        Allergies:  Allergies  Allergen Reactions  . Losartan Swelling  . Dristan Rash    Family History  Problem Relation Age of Onset  . Diabetes Mother   . Heart disease Father     Social  History:  reports that he quit smoking about 34 years ago. He has never used smokeless tobacco. He reports that he drinks about 1.8 oz of alcohol per week. He reports that he does not use illicit drugs.  ROS: Unable to obtain review of systems due to patient's sedation/intubated.  Physical Exam:  Vital signs in last 24 hours: Temp:  [98.3 F (36.8 C)-99 F (37.2 C)] 98.3 F (36.8 C) (04/24 1400) Pulse Rate:  [53-75] 63 (04/24 1500) Resp:  [12-19] 14 (04/24 1500) BP: (119-157)/(41-52) 135/51 mmHg (04/24 1600) SpO2:  [94 %-100 %] 100 % (04/24 1631) FiO2 (%):  [30 %-40 %] 30 % (04/24 1631) Weight:  [125 lb 7.1 oz (56.9 kg)] 125 lb 7.1 oz (56.9 kg) (04/24 0442) Constitutional:  Intubated.  Unresponsive to deep palpation/manipulation on my examination. HEENT:  Trachea midline.  ET tube in place. GI: Abdomen is soft, nontender, nondistended, no abdominal masses GU: 10 cm area of necrosis/ wet gangrene with surrounding edema/ induration at the base of scrotum highly concerning for necrotizing fasciitis. There is also an erythematous erosion on his left lateral scrotum of similar size.  No obvious appreciable crepitus.  No suprapubic/abdominal wall involvement. Neurologic: Unresponsive.  Laboratory Data:   Recent Labs  05/20/15 0503 05/20/15 2304 05/22/15 0839  WBC 24.1* 35.5* 32.5*  HGB 7.5* 7.9* 7.5*  HCT 23.6* 25.4* 22.9*    Recent Labs  05/20/15 2304 05/21/15 0758 05/22/15 0839  NA 145 143 144  K 3.2* 4.6 5.0  CL 100* 102 104  CO2 16* 25 29  GLUCOSE 213* 285* 172*  BUN 34* 46* 79*  CREATININE 1.54* 1.79* 2.64*  CALCIUM 9.7 9.4 9.9    Recent Labs  05/20/15 2304  INR 1.23   No results for input(s): LABURIN in the last 72 hours. Results for orders placed or performed during the hospital encounter of 05/28/2015  Urine culture     Status: Abnormal   Collection Time: 05/05/2015  5:26 PM  Result Value Ref Range Status   Specimen Description URINE, RANDOM  Final   Special  Requests NONE  Final   Culture >=100,000 COLONIES/mL ENTEROCOCCUS FAECALIS (A)  Final   Report Status 05/22/2015 FINAL  Final   Organism ID, Bacteria ENTEROCOCCUS FAECALIS (A)  Final      Susceptibility   Enterococcus faecalis - MIC*    AMPICILLIN <=2 SENSITIVE Sensitive     LEVOFLOXACIN 0.5 SENSITIVE Sensitive     NITROFURANTOIN <=16 SENSITIVE Sensitive  VANCOMYCIN 1 SENSITIVE Sensitive     LINEZOLID 2 SENSITIVE Sensitive     * >=100,000 COLONIES/mL ENTEROCOCCUS FAECALIS  Blood Culture (routine x 2)     Status: None (Preliminary result)   Collection Time: 05/07/2015  6:13 PM  Result Value Ref Range Status   Specimen Description BLOOD LEFT ARM  Final   Special Requests BOTTLES DRAWN AEROBIC AND ANAEROBIC [place]  Final   Culture NO GROWTH 3 DAYS  Final   Report Status PENDING  Incomplete  Blood Culture (routine x 2)     Status: None (Preliminary result)   Collection Time: 05/23/2015  6:13 PM  Result Value Ref Range Status   Specimen Description BLOOD RIGHT ASSIST CONTROL  Final   Special Requests BOTTLES DRAWN AEROBIC AND ANAEROBIC 8CC  Final   Culture NO GROWTH 3 DAYS  Final   Report Status PENDING  Incomplete  MRSA PCR Screening     Status: None   Collection Time: 05/28/2015 10:29 PM  Result Value Ref Range Status   MRSA by PCR NEGATIVE NEGATIVE Final    Comment:        The GeneXpert MRSA Assay (FDA approved for NASAL specimens only), is one component of a comprehensive MRSA colonization surveillance program. It is not intended to diagnose MRSA infection nor to guide or monitor treatment for MRSA infections.      Radiologic Imaging:  Study Result     CLINICAL DATA: Decreased mental status. Status post code on 04/22. Pus coming from rectum  EXAM: CT ABDOMEN AND PELVIS WITH CONTRAST  TECHNIQUE: Multidetector CT imaging of the abdomen and pelvis was performed using the standard protocol following bolus administration of intravenous contrast.  CONTRAST: 158mL  ISOVUE-300 IOPAMIDOL (ISOVUE-300) INJECTION 61%  COMPARISON: 06/09/2007.  FINDINGS: Lower chest: Dependent changes are identified in the lung bases posteriorly. No pleural or pericardial effusion. No airspace consolidation identified.  Hepatobiliary: Stone within the neck of gallbladder measure 1 cm, image 25 of series 3. No suspicious liver abnormality.  Pancreas: No inflammation or mass identified.  Spleen: The spleen appears normal.  Adrenals/Urinary Tract: Bilateral adrenal nodules have been present dating back to 05/29/07 and are favored to represent benign adenomas. Unremarkable appearance of the left kidney. Cyst within the right kidney measures 2.8 cm, image 42 of series 3. Circumferential wall thickening involving the urinary bladder is identified.  Stomach/Bowel: There is an nasogastric tube within the stomach. The small bowel loops have a normal caliber. There is no pathologic dilatation of the colon. Circumferential wall thickening and mucosal enhancement involving the rectum is noted, image 84 series 3.  Vascular/Lymphatic: Aortic atherosclerosis noted. No aneurysm. 7 mm aortocaval node is identified, image 42 of series 3.  Reproductive: There is diffuse scrotal edema. Scrotal wall thickness measures up to 1.6 cm, image 111 of series 3. Gas is identified within the left hemiscrotum, image 100 of series 3. This extends into the base of scrotum and up into the perineum. There is a gas and fluid collection within the left floor of pelvis which measures 5.5 x 2.5 x 6.6 cm. This has mass effect upon the rectum and prostate gland. The gas within the floor of pelvis extends into the left pelvic sidewall, image 78 of series 3 and into the left iliac fossa, image 75 of series 3.  Other: As above.  Musculoskeletal: No aggressive lytic or sclerotic bone lesions identified. Degenerative disc disease is noted within the lumbar spine.  IMPRESSION: 1.  Examination is positive for scrotal gangrene.  Fluid and gas tracks from the left hemiscrotum into the floor of pelvis, along the left pelvic sidewall and into the left iliac fossa. Gas and fluid collections exert mass effect upon the left side of rectum as well as the prostate gland. Critical Value/emergent results were called by telephone at the time of interpretation on 05/22/2015 at 4:58 pm to Dr. Hollice Espy , who verbally acknowledged these results. 2. Aortic atherosclerosis 3. Bilateral adrenal nodules, likely benign adenomas. 4. Gallstone.   Electronically Signed  By: Kerby Moors M.D.  On: 05/22/2015 17:00   CT scan personally reviewed today along with with the radiologist, Dr. Clovis Riley  Impression/Assessment:  80 year old male with multiple medical comorbidities including recent stroke, cardiac arrest, end-stage renal disease on hemodialysis, diabetes currently intubated and minimally responsive found to have extensive Fournier's gangrene involving scrotum/perineum/pelvic sidewall and perirectal area.  We discussed extensively with the family today that his outcome is very grim.  Without comorbidities, the mortality rate for this extensive degree of necrotizing fasciitis is very high. In the setting of recent cardiac arrest along with his poor prognosis at baseline, it is unlikely that he would survive. In addition today, we discussed that surgical debridement would require multiple repetitive trips to the operating room, potentially disfiguring surgeries, and likely permanent colostomy . He will also eventually need reconstruction/ skin grafts, etc.    After a lengthy discussion with the family and other consultants, the family has elected not to pursue any further surgical intervention. Chaplain was at the bedside at the time of decision making as well.  Plan:  -Recommend palliative care consult, discussed this with the family -Case was discussed with all parties involved  including Dr. Burt Knack with general surgery and Dr. Ashby Dawes  05/22/2015, 4:54 PM  Hollice Espy,  MD   Thank you for involving me in this patient's care.  Please page with any further questions or concerns.

## 2015-05-22 NOTE — Progress Notes (Signed)
Dr. Erlene Quan and Dr. Burt Knack at bedside talking to patient's family about CT results and surgical options.

## 2015-05-22 NOTE — Progress Notes (Signed)
Pharmacy Antibiotic Note  Jason Aguilar is a 80 y.o. male admitted on 05/06/2015 with aspiration pneumonia.  Pharmacy has been consulted for piperacillin/tazobactam dosing.  Patient is currently on day #5 of antibiotics with vancomycin & piperacillin/tazobactam. Urine culture resulted >100K colonies enterococcus faecalis. MRSA PCR negative. Vancomycin was discontinued today.   Plan: Continue piperacillin/tazobactam 3.375 g IV q12h  Height: 5\' 4"  (162.6 cm) Weight: 125 lb 7.1 oz (56.9 kg) IBW/kg (Calculated) : 59.2  Temp (24hrs), Avg:98.6 F (37 C), Min:98.3 F (36.8 C), Max:99 F (37.2 C)   Recent Labs Lab 05/27/2015 1726 05/06/2015 2225 05/19/15 0437 05/20/15 0503 05/20/15 2304 05/21/15 0308 05/21/15 0758 05/22/15 0839  WBC 35.1*  --  26.3* 24.1* 35.5*  --   --  32.5*  CREATININE 1.68* 1.76* 1.85* 2.49* 1.54*  --  1.79* 2.64*  LATICACIDVEN 3.5* 1.7  --   --  15.7* 9.9* 2.4*  --     Estimated Creatinine Clearance: 18.9 mL/min (by C-G formula based on Cr of 2.64).    Allergies  Allergen Reactions  . Losartan Swelling  . Dristan Rash    Antimicrobials this admission: Piperacillin/tazobactam 4/20 >>  vancomycin 4/20 >> 4/24  Microbiology results: 4/20 BCx: No growth 3 days 4/20 UCx: >100K enterococcus faecalis  4/20 MRSA PCR: negative  Thank you for allowing pharmacy to be a part of this patient's care.  Lenis Noon, PharmD Clinical Pharmacist 05/22/2015 2:45 PM

## 2015-05-22 NOTE — Progress Notes (Signed)
Dr. Kingsley Callander at bedside updating daughter and wife of CT of abdomen results.  Patient's family very upset with results and tearful.  Chaplin paged to bedside for support.

## 2015-05-22 NOTE — Progress Notes (Signed)
Urology at bedside assessing patients scrotum.

## 2015-05-22 NOTE — Progress Notes (Signed)
Left scrotal hardening with red lesion noted and Dr. Percell Miller notified. Order obtained for surgical consult.

## 2015-05-22 NOTE — Progress Notes (Signed)
Dr. Erlene Quan verbalized to nurse that family decided against surgical options due to high risk and that nurse place palliative care consult.

## 2015-05-22 NOTE — Progress Notes (Signed)
Dr. Burt Knack notified of consult however feels that patient should be seen by urology.  Surgical consult d/c and and urology consult placed.

## 2015-05-22 NOTE — Progress Notes (Addendum)
Dr. Frann Rider notified that patient has been on fentanyl drip at 25 mcg throughout night and fentanyl drip turned off at 1054.  Patient does not wake and follow any commands, only responds to painful stimuli.  Cough and gag intact.  Orders obtained for CT of head.

## 2015-05-22 NOTE — Progress Notes (Signed)
Nutrition Follow-up  DOCUMENTATION CODES:   Not applicable  INTERVENTION:  -Tube feeding to be restarted this AM; nutritional needs reassessed today. Recommend changing goal rate of Vital High Protein to rate of 55 ml/hr providing 1320 kcals, 116 g of protein, 1108 mL of free water.  Meets 100% protein, calorie needs as per ASPEN guidelines. Recommend maintenance free water flushes only as pt anuric, ESRD on HD. Continue to assess  NUTRITION DIAGNOSIS:   Inadequate oral intake related to acute illness as evidenced by NPO status. Being addressed via TF  GOAL:   Provide needs based on ASPEN/SCCM guidelines  MONITOR:   TF tolerance, Vent status, Labs, I & O's, Weight trends  REASON FOR ASSESSMENT:   Consult, Ventilator Enteral/tube feeding initiation and management  ASSESSMENT:   80 yo male admitted with lethargy, unable to clear secretions, intubated for airway protection on 05/15/2015; pt had recent CVA with dysphagia. Pt also with hx of ESRD on HD   Patient is currently intubated on ventilator support, on fentanyl, versed; levophed titrate off at present MV: 7.1 L/min Temp (24hrs), Avg:98.7 F (37.1 C), Min:98.5 F (36.9 C), Max:99 F (37.2 C)  Noted pt code blue on 05/20/15 after dialysis  Tube Feeding remains off this AM; per Apolonio Schneiders RN, night nurse reporting that TF not infusing due to potassium level, TF orders remain active. Potassium 5.0 today. Per I/O flow sheet, TF off Saturday during day shift, received briefly on Saturday night, does not appear to have received any TF on Sunday.   Diet Order:  Diet NPO time specified   Digestive System: OG tube in place  Skin:  Reviewed, no issues  Last BM:  +small mucousy BM per rectum today   Labs: potassium 5.0  Meds: senokot  Height:   Ht Readings from Last 1 Encounters:  05/17/2015 5\' 4"  (1.626 m)    Weight:   Wt Readings from Last 1 Encounters:  05/22/15 125 lb 7.1 oz (56.9 kg)    BMI:  Body mass index is  21.52 kg/(m^2).  Estimated Nutritional Needs:   Kcal:  1383 kcals   Protein:  87-116 g  Fluid:  >/= 1.6 L  EDUCATION NEEDS:   No education needs identified at this time  [place], [place], [place] [phone] Pager  [phone] Weekend/On-Call Pager

## 2015-05-23 DIAGNOSIS — Z9911 Dependence on respirator [ventilator] status: Secondary | ICD-10-CM

## 2015-05-23 DIAGNOSIS — I10 Essential (primary) hypertension: Secondary | ICD-10-CM

## 2015-05-23 DIAGNOSIS — J449 Chronic obstructive pulmonary disease, unspecified: Secondary | ICD-10-CM

## 2015-05-23 DIAGNOSIS — Z515 Encounter for palliative care: Secondary | ICD-10-CM

## 2015-05-23 DIAGNOSIS — G931 Anoxic brain damage, not elsewhere classified: Secondary | ICD-10-CM

## 2015-05-23 DIAGNOSIS — Z992 Dependence on renal dialysis: Secondary | ICD-10-CM

## 2015-05-23 DIAGNOSIS — N186 End stage renal disease: Secondary | ICD-10-CM

## 2015-05-23 DIAGNOSIS — R400 Somnolence: Secondary | ICD-10-CM

## 2015-05-23 DIAGNOSIS — E1122 Type 2 diabetes mellitus with diabetic chronic kidney disease: Secondary | ICD-10-CM

## 2015-05-23 LAB — CULTURE, BLOOD (ROUTINE X 2)
Culture: NO GROWTH
Culture: NO GROWTH

## 2015-05-23 MED ORDER — MORPHINE 100MG IN NS 100ML (1MG/ML) PREMIX INFUSION: 5.0000 mg/h | INTRAVENOUS | Status: DC

## 2015-05-23 MED ORDER — MORPHINE 100MG IN NS 100ML (1MG/ML) PREMIX INFUSION
12.0000 mg/h | INTRAVENOUS | Status: DC
  Administered 2015-05-23: 5 mg/h via INTRAVENOUS
  Administered 2015-05-24: 13:00:00 10 mg/h via INTRAVENOUS
  Administered 2015-05-24: 8 mg/h via INTRAVENOUS
  Administered 2015-05-24: 23:00:00 12 mg/h via INTRAVENOUS
  Filled 2015-05-23 (×4): qty 100

## 2015-05-23 NOTE — Progress Notes (Signed)
Report called to 1C nurse Shay informed RN pt extubated today and comfort care status per family wishes; pt is currently on RA with O2 sats 88-91% with shallow respirations; NSR on cardiac monitor; morphine drip infusing at 8 mg/hr pt resting comfortably; pt has daughter and wife at bedside will continue to monitor and assess pt

## 2015-05-23 NOTE — Progress Notes (Signed)
Nutrition Brief Note  Chart reviewed. Pt now transitioning to comfort care. TF on hold. No further nutrition interventions warranted at this time. Will sign off. Please re-consult if needed.   Kerman Passey [place], [place], LDN [phone] Pager  [phone] Weekend/On-Call Pager

## 2015-05-23 NOTE — Consult Note (Addendum)
Palliative Medicine Inpatient Consult Note   Name: Jason Aguilar Date: 05/23/2015 MRN: [id number]  DOB: [birth year]  Referring Physician: Vilinda Boehringer, MD  Palliative Care consult requested for this 80 y.o. male for goals of medical therapy in patient with anoxic brain injury following cardiac arrest on 4/22.   PLAN: Dr. Ashby Dawes had talked with pt's wife and daughter yesterday about the lack of expectation of recovery in this pt who has not been responsive since his cardiac arrest. Today, family is bedside, crying, and they state they are ready to allow him to pass on so he does not suffer any longer.    I have initiated comfort terminal withdrawal of care orders. Should he survive extubation, I will enter orders for transfer to 1c so that terminal comfort care can then continue there.  The  Morphine infusion has been confirmed to be present on the unit so that we can start this process of compassionate extubation without undue delay.   I have provided supportive conversation to the family and will continue to address their questions as needed. Chaplain has been called and we will soon extubate pt.  CLINICAL NARRATIVE: Pt is a 80 yo man who had a recent CVA and was sent after that to [facility] for rehab. He has been getting hemodialysis for end stage renal disease. He was found after dialysis with hypoxia. He had low sats in the ER and was intubated. ON 4/22, he suffered cardiac arrest for 8 minutes of ACLS.   He was found to have a Fournier's gangrene with a CT showing significant necrotizing fasciitis.  Surgery did not feel pt was a candidate for surgical intervention.  On 4/24, Dr Ashby Dawes had a talk with family about pt's grim prognosis for any meaningful recovery.  Pt is non responsive, ventilated but on no sedation.  Family (wife and daughter) agreed to comfort care.  Today, I am asked to initiate terminal extubation and comfort care orders.     IMPRESSION: Acute resp failure Anoxic  brain injury Cardiac arrest 4/22 Fournier's gangrene COPD ESRD n hemodialysis Metabolic Encephalopathy OSA on CPAP GERD Bladder Cell Cancer DM2 HTN Anemia of chronic disease UTI's  Hematuria Glaucoma Hyperlipidemia H/O Tobacco abuse   REVIEW OF SYSTEMS:  Patient is not able to provide ROS --unresponsive  SPIRITUAL SUPPORT SYSTEM: Yes  Small family.  SOCIAL HISTORY:  reports that he quit smoking about 34 years ago. He has never used smokeless tobacco. He reports that he drinks about 1.8 oz of alcohol per week. He reports that he does not use illicit drugs.  Lived with wife and daughter.  Was a heavy drinker (1/5th of liquor/ day) but stopped this over 30 yrs ago.  No drug use.   LEGAL DOCUMENTS:  Chart to be located.  CODE STATUS: DNR  PAST MEDICAL HISTORY: Past Medical History  Diagnosis Date  . Glaucoma     both eyes Drs. Arrie Eastern and [place]  . Hematuria syndrome     s/p negative cystoscopy  . GERD (gastroesophageal reflux disease) 2003    EGD  . Allergy   . Hyperlipidemia   . Obstructive sleep apnea of adult     CPAP at 8 cm H20  . Noncompliance with medication regimen   . 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 by [facility]: low grade noninvasive  . Chronic kidney disease (CKD), stage III (moderate)     with proteinuria  . Glaucoma   . Hypertension   . Diabetes mellitus  Type 2  . Cataract due to secondary diabetes mellitus ([place])   . COPD (chronic obstructive pulmonary disease) ([place])     secondary to tobacco abuse  . History of tobacco abuse     60 pack year history; quit 1980    PAST SURGICAL HISTORY:  Past Surgical History  Procedure Laterality Date  . Elbow surgery    . Transurethral resection of bladder tumor  Mar 13 2011    [facility]  . Peripheral vascular catheterization N/A 01/03/2015    Procedure: Dialysis/Perma Catheter Insertion;  Surgeon: Katha Cabal, MD;  Location: [facility];  Service: Cardiovascular;   Laterality: N/A;  . Skin graft    . Cataract extraction      ALLERGIES:  is allergic to losartan and dristan.  MEDICATIONS:  Current Facility-Administered Medications  Medication Dose Route Frequency Provider Last Rate Last Dose  . fentaNYL (SUBLIMAZE) bolus via infusion 25 mcg  25 mcg Intravenous Q1H PRN Bincy S Varughese, NP      . fentaNYL 2524mcg in NS 233mL (66mcg/ml) infusion-PREMIX  25-400 mcg/hr Intravenous Continuous Colleen Can, MD   Stopped at 05/22/15 1054  . morphine 100mg  in NS 129mL (1mg /mL) infusion - premix  5 mg/hr Intravenous Continuous Colleen Can, MD      . morphine 2 MG/ML injection 2-5 mg  2-5 mg Intravenous Q10 min PRN Laverle Hobby, MD        Vital Signs: BP 154/57 mmHg  Pulse 81  Temp(Src) 98.6 F (37 C) (Oral)  Resp 13  Ht 5\' 4"  (1.626 m)  Wt 61.9 kg (136 lb 7.4 oz)  BMI 23.41 kg/m2  SpO2 100% Filed Weights   05/21/15 0433 05/22/15 0442 05/23/15 0416  Weight: 59 kg (130 lb 1.1 oz) 56.9 kg (125 lb 7.1 oz) 61.9 kg (136 lb 7.4 oz)    Estimated body mass index is 23.41 kg/(m^2) as calculated from the following:   Height as of this encounter: 5\' 4"  (1.626 m).   Weight as of this encounter: 61.9 kg (136 lb 7.4 oz).  PERFORMANCE STATUS (ECOG) : 4 - Bedbound  PHYSICAL EXAM: Pt is unresponsive in ICU bed --head lying to one side Eyes closed No JVD or TM Hrt rrr no m Lungs with decreased BS bases Abd distant BS Groin not examined as yet with family bedside   (see report re Fournier's gangrene) Ext no mottling or cyanosis at present   LABS: CBC:    Component Value Date/Time   WBC 32.5* 05/22/2015 0839   WBC 6.7 09/17/2013 1022   HGB 7.5* 05/22/2015 0839   HGB 13.3 09/17/2013 1022   HCT 22.9* 05/22/2015 0839   HCT 40.8 09/17/2013 1022   PLT 198 05/22/2015 0839   PLT 185 09/17/2013 1022   MCV 89.4 05/22/2015 0839   MCV 91 09/17/2013 1022   NEUTROABS 32.3* 05/20/2015 2304   NEUTROABS 19.7* 07/25/2013 0417   LYMPHSABS  2.5 05/20/2015 2304   LYMPHSABS 0.7* 07/25/2013 0417   MONOABS 0.7 05/20/2015 2304   MONOABS 0.8 07/25/2013 0417   EOSABS 0.0 05/20/2015 2304   EOSABS 0.0 07/25/2013 0417   BASOSABS 0.0 05/20/2015 2304   BASOSABS 0.0 07/25/2013 0417   Comprehensive Metabolic Panel:    Component Value Date/Time   NA 144 05/22/2015 0839   NA 142 09/17/2013 1022   K 5.0 05/22/2015 0839   K 4.4 09/17/2013 1022   CL 104 05/22/2015 0839   CL 114* 09/17/2013 1022   CO2 29 05/22/2015  0839   CO2 19* 09/17/2013 1022   BUN 79* 05/22/2015 0839   BUN 23* 09/17/2013 1022   CREATININE 2.64* 05/22/2015 0839   CREATININE 2.09* 09/17/2013 1022   CREATININE 1.77* 10/19/2010 1655   GLUCOSE 172* 05/22/2015 0839   GLUCOSE 131* 09/17/2013 1022   CALCIUM 9.9 05/22/2015 0839   CALCIUM 9.4 09/17/2013 1022   AST 50* 05/27/2015 1726   AST 26 07/23/2013 2152   ALT 27 05/23/2015 1726   ALT 20 07/23/2013 2152   ALKPHOS 132* 04/29/2015 1726   ALKPHOS 99 07/23/2013 2152   BILITOT 0.5 05/15/2015 1726   BILITOT 0.7 07/23/2013 2152   PROT 6.3* 05/08/2015 1726   PROT 6.8 07/23/2013 2152   ALBUMIN 2.4* 05/07/2015 1726   ALBUMIN 3.6 07/23/2013 2152    More than 50% of the visit was spent in counseling/coordination of care: Yes  Time Spent: 120 minutes

## 2015-05-23 NOTE — Progress Notes (Signed)
Comfort care,extubated per MD order,no complications

## 2015-05-23 NOTE — Progress Notes (Signed)
[facility] Progess Note    ASSESSMENT/PLAN   80 year old male past medical history of end-stage renal disease on dialysis, recent CVA, hematuria syndrome, GERD,hyperlipidemia, OSA on CPAP, bladder cell carcinoma, diabetes type 2, minute from the ER after being found unresponsive after dialysis with hypoxia. Subsequently suffered cardiac arrest on 4/22 for 8 minutes. Course complicated by development of finding of gangrene and scrotal sac with free air in the pelvis consistent with gas gangrene and may be contributing to the patient's septic shock. Subsequently, the patient was made comfort measures.  PULMONARY A: VDRF Lethargy Unreponsive COPD P:  -Full vent support; settings reviewed, currently on PRVC/14/500/5/40%. -VAP protocol -ABG -daily CXR, reviewed today is unremarkable, life support tubes in adequate position. position.  CARDIOVASCULAR A:  Elevated troponin-peaked at 0.17-likely demand ischemia H/o Hypertension P:  -Hemodynamics per ICU protocol -Hold antihypertensives 2/2 shock, continue to wean off pressors. -Pressors prn to keep MAP>65  RENAL A:  ESRD on HD P:  -HD per nephrology -Monitor and replace electrolytes  GASTROINTESTINAL A:  H/O GERD P:  -PPI  HEMATOLOGIC A:  H/O bladder cancer s/p TURP Anemia-Chronic disease versus blood loss; HG 7.5 today P:  -Transfuse prn -Monitor CBC  INFECTIOUS A:  ?Sepsis P:  -Empiric abx-on Zosyn Vanco. -F/U cultures  Micro/culture results: BCx2 4/20: Negative. UC 4/20: Positive for Enterococcus faecalis. MRSA screen 4/20: Negative. Sputum--  Antibiotics: Vancomycin 4/21> Zosyn 4/21>   ENDOCRINE A:  T2DM P:  -POCT Glucose monitoring with SSI coverage, insulin drip, wean as tolerated.  NEUROLOGIC A:  ? CVA-initial CT head negative Now with possible anoxic encephalopathy status post cardiac arrest on 4/22. P:  RASS goal: -1 to -2 -Versed  and fentanyl for vent sedation -We'll need to repeat CT head and EEG.   MAJOR EVENTS/TEST RESULTS: Cardiac arrest on 4/22, for 8 minutes.  Best Practices Code Status: Full. Diet: NPO GI prophylaxis: PPI. VTE prophylaxis: SCD's / heparin.  LINES/TUBES: Right Subclavian HD cath  ---------------------------------------   ----------------------------------------   Name: Jason Aguilar MRN: [id number] DOB: 14-Jan-1936    ADMISSION DATE:  05/28/2015    SUBJECTIVE:   Pt currently on the ventilator, can not provide history or review of systems.    VITAL SIGNS: Temp:  [98.1 F (36.7 C)-99 F (37.2 C)] 98.6 F (37 C) (04/25 1201) Pulse Rate:  [56-107] 81 (04/25 1400) Resp:  [11-21] 13 (04/25 1200) BP: (104-154)/(48-58) 154/57 mmHg (04/25 1400) SpO2:  [91 %-100 %] 100 % (04/25 1400) FiO2 (%):  [30 %] 30 % (04/25 1256) Weight:  [136 lb 7.4 oz (61.9 kg)] 136 lb 7.4 oz (61.9 kg) (04/25 0416) HEMODYNAMICS:   VENTILATOR SETTINGS: Vent Mode:  [-] PRVC FiO2 (%):  [30 %] 30 % Set Rate:  [14 bmp] 14 bmp Vt Set:  [500 mL] 500 mL PEEP:  [5 cmH20] 5 cmH20 Pressure Support:  [5 cmH20] 5 cmH20 Plateau Pressure:  [11 cmH20] 11 cmH20 INTAKE / OUTPUT:  Intake/Output Summary (Last 24 hours) at 05/23/15 1512 Last data filed at 05/22/15 1715  Gross per 24 hour  Intake     87 ml  Output      0 ml  Net     87 ml    PHYSICAL EXAMINATION: Physical Examination:   VS: BP 154/57 mmHg  Pulse 81  Temp(Src) 98.6 F (37 C) (Oral)  Resp 13  Ht 5\' 4"  (1.626 m)  Wt 136 lb 7.4 oz (61.9 kg)  BMI 23.41 kg/m2  SpO2 100%  General Appearance: No distress  Neuro:without focal findings, mental status normal. HEENT: PERRLA, EOM intact. Pulmonary: normal breath sounds  , decreased air entry bilaterally. CardiovascularNormal S1,S2.  No m/r/g.   Abdomen: Benign, Soft, non-tender. Renal:  No costovertebral tenderness  GU:  Not performed at this time. Endocrine: No evident thyromegaly. Skin:    warm, no rashes, no ecchymosis  Extremities: normal, no cyanosis, clubbing.   LABS:   LABORATORY PANEL:   CBC  Recent Labs Lab 05/22/15 0839  WBC 32.5*  HGB 7.5*  HCT 22.9*  PLT 198    Chemistries   Recent Labs Lab 05/26/2015 1726  05/20/15 2304  05/22/15 0839  NA 137  < > 145  < > 144  K 3.2*  < > 3.2*  < > 5.0  CL 97*  < > 100*  < > 104  CO2 26  < > 16*  < > 29  GLUCOSE 144*  < > 213*  < > 172*  BUN 16  < > 34*  < > 79*  CREATININE 1.68*  < > 1.54*  < > 2.64*  CALCIUM 10.0  < > 9.7  < > 9.9  MG  --   < > 2.1  < > 2.2  PHOS  --   --  7.7*  --   --   AST 50*  --   --   --   --   ALT 27  --   --   --   --   ALKPHOS 132*  --   --   --   --   BILITOT 0.5  --   --   --   --   < > = values in this interval not displayed.   Recent Labs Lab 05/21/15 2256 05/22/15 0052 05/22/15 0157 05/22/15 0259 05/22/15 0718 05/22/15 1150  GLUCAP 147* 151* 158* 178* 149* 154*    Recent Labs Lab 05/13/2015 2200 05/21/15 0501  PHART 7.54* 7.47*  PCO2ART 32 32  PO2ART 116* 176*    Recent Labs Lab 05/22/2015 1726  AST 50*  ALT 27  ALKPHOS 132*  BILITOT 0.5  ALBUMIN 2.4*    Cardiac Enzymes  Recent Labs Lab 05/19/15 1109  TROPONINI 0.06*    RADIOLOGY:  Ct Head Wo Contrast  05/22/2015  CLINICAL DATA:  Decreasing mental status EXAM: CT HEAD WITHOUT CONTRAST TECHNIQUE: Contiguous axial images were obtained from the base of the skull through the vertex without intravenous contrast. COMPARISON:  05/20/2015 FINDINGS: Diffuse atrophic and chronic white matter ischemic changes are noted similar to that seen on the prior exam. Some calcification of the tentorium cerebelli is noted. No findings to suggest acute hemorrhage, acute infarction or space-occupying mass lesion are noted. IMPRESSION: No acute abnormality noted. Chronic atrophic and ischemic changes. Electronically Signed   By: Inez Catalina M.D.   On: 05/22/2015 16:15   Ct Abdomen Pelvis W Contrast  05/22/2015   CLINICAL DATA:  Decreased mental status. Status post code on 04/22. Pus coming from rectum EXAM: CT ABDOMEN AND PELVIS WITH CONTRAST TECHNIQUE: Multidetector CT imaging of the abdomen and pelvis was performed using the standard protocol following bolus administration of intravenous contrast. CONTRAST:  176mL ISOVUE-300 IOPAMIDOL (ISOVUE-300) INJECTION 61% COMPARISON:  06/09/2007. FINDINGS: Lower chest: Dependent changes are identified in the lung bases posteriorly. No pleural or pericardial effusion. No airspace consolidation identified. Hepatobiliary: Stone within the neck of gallbladder measure 1 cm, image 25 of series 3. No suspicious liver abnormality. Pancreas: No inflammation  or mass identified. Spleen: The spleen appears normal. Adrenals/Urinary Tract: Bilateral adrenal nodules have been present dating back to 05/29/07 and are favored to represent benign adenomas. Unremarkable appearance of the left kidney. Cyst within the right kidney measures 2.8 cm, image 42 of series 3. Circumferential wall thickening involving the urinary bladder is identified. Stomach/Bowel: There is an nasogastric tube within the stomach. The small bowel loops have a normal caliber. There is no pathologic dilatation of the colon. Circumferential wall thickening and mucosal enhancement involving the rectum is noted, image 84 series 3. Vascular/Lymphatic: Aortic atherosclerosis noted. No aneurysm. 7 mm aortocaval node is identified, image 42 of series 3. Reproductive: There is diffuse scrotal edema. Scrotal wall thickness measures up to 1.6 cm, image 111 of series 3. Gas is identified within the left hemiscrotum, image 100 of series 3. This extends into the base of scrotum and up into the perineum. There is a gas and fluid collection within the left floor of pelvis which measures 5.5 x 2.5 x 6.6 cm. This has mass effect upon the rectum and prostate gland. The gas within the floor of pelvis extends into the left pelvic sidewall, image 78  of series 3 and into the left iliac fossa, image 75 of series 3. Other: As above. Musculoskeletal: No aggressive lytic or sclerotic bone lesions identified. Degenerative disc disease is noted within the lumbar spine. IMPRESSION: 1. Examination is positive for scrotal gangrene. Fluid and gas tracks from the left hemiscrotum into the floor of pelvis, along the left pelvic sidewall and into the left iliac fossa. Gas and fluid collections exert mass effect upon the left side of rectum as well as the prostate gland. Critical Value/emergent results were called by telephone at the time of interpretation on 05/22/2015 at 4:58 pm to Dr. Hollice Espy , who verbally acknowledged these results. 2. Aortic atherosclerosis 3. Bilateral adrenal nodules, likely benign adenomas. 4. Gallstone. Electronically Signed   By: Kerby Moors M.D.   On: 05/22/2015 17:00   Dg Chest Port 1 View  05/22/2015  CLINICAL DATA:  80 year old male with acute respiratory failure. Insert follow-up EXAM: PORTABLE CHEST 1 VIEW COMPARISON:  05/21/2015. FINDINGS: Endotracheal tube tip now 2.1 cm above the carina. Left central line tip at the level of formation of the superior vena cava/distal left brachiocephalic vein. Right central line tip at the level of the distal superior vena cava. Nasogastric tube courses below the diaphragm. Tip is not included on the present exam. No gross pneumothorax. No infiltrate, congestive heart failure or pneumothorax. Heart size within normal limits. Calcified slightly tortuous aorta. IMPRESSION: Endotracheal tube tip has been retracted slightly now 2.1 cm above the carina. Remaining findings without change. Electronically Signed   By: Genia Del M.D.   On: 05/22/2015 07:46       --Marda Stalker, MD.  ICU Pager: [phone] [facility] Office Number: [phone]  Patricia Pesa, M.D.  Vilinda Boehringer, M.D.  Merton Border, M.D  05/23/2015      Marda Stalker  M.D. 05/23/2015  Critical Care Attestation.  I have personally obtained a history, examined the patient, evaluated laboratory and imaging results, formulated the assessment and plan and placed orders. The Patient requires high complexity decision making for assessment and support, frequent evaluation and titration of therapies, application of advanced monitoring technologies and extensive interpretation of multiple databases. The patient has critical illness that could lead imminently to failure of 1 or more organ systems and requires the highest level of physician preparedness  to intervene.  Critical Care Time devoted to patient care services described in this note is 65 minutes and is exclusive of time spent in procedures.

## 2015-05-23 NOTE — Care Management Note (Signed)
Admission records have been sent to [facility].  Patient is on TTS schedule at the outpatient center.  At discharge I will send additional records.  Iran Sizer  Dialysis Coordinator  [phone]

## 2015-05-23 NOTE — Progress Notes (Signed)
Patient transferred from CCU. Family at bedside. Patient presents with shallow respirations. Morphine drip infusing at 8 mg/hr. Bereavement cart in room. Madlyn Frankel, RN

## 2015-05-23 NOTE — Progress Notes (Signed)
[facility]  ROUNDING NOTE   Subjective:   Critically ill at this time Intubated sedated, vent dependent No pressors Events of yesterday are noted. Patient Dx with Fournier's gangrene -  - No surgical intervention as too high risk from co-morbidities and prolonged recovery  Objective:  Vital signs in last 24 hours:  Temp:  [98.1 F (36.7 C)-99 F (37.2 C)] 99 F (37.2 C) (04/25 0800) Pulse Rate:  [56-107] 67 (04/25 0800) Resp:  [11-19] 14 (04/25 0800) BP: (104-144)/(46-54) 134/51 mmHg (04/25 0800) SpO2:  [91 %-100 %] 98 % (04/25 0800) FiO2 (%):  [30 %-40 %] 30 % (04/25 0746) Weight:  [61.9 kg (136 lb 7.4 oz)] 61.9 kg (136 lb 7.4 oz) (04/25 0416)  Weight change: 5 kg (11 lb 0.4 oz) Filed Weights   05/21/15 0433 05/22/15 0442 05/23/15 0416  Weight: 59 kg (130 lb 1.1 oz) 56.9 kg (125 lb 7.1 oz) 61.9 kg (136 lb 7.4 oz)    Intake/Output: I/O last 3 completed shifts: In: 346.6 [I.V.:69.6; NG/GT:177; IV Piggyback:100] Out: 1 [Stool:1]   Intake/Output this shift:     Physical Exam: General: Critically ill  Head: +ETT, +OGT  Eyes: Eyes closed  Neck: Supple, trachea midline  Lungs:  Vent assisted   Heart: Regular rate and rhythm  Abdomen:  Soft,    Extremities: + dependent peripheral edema.  Neurologic: Intubated, sedated  Skin: Scrotal erythema  Access: RIJ permcath    Basic Metabolic Panel:  Recent Labs Lab 05/19/15 0437 05/20/15 0503 05/20/15 2304 05/21/15 0758 05/22/15 0839  NA 139 139 145 143 144  K 3.7 4.1 3.2* 4.6 5.0  CL 103 102 100* 102 104  CO2 27 26 16* 25 29  GLUCOSE 160* 151* 213* 285* 172*  BUN 33* 72* 34* 46* 79*  CREATININE 1.85* 2.49* 1.54* 1.79* 2.64*  CALCIUM 10.1 10.3 9.7 9.4 9.9  MG 1.7  --  2.1 1.9 2.2  PHOS  --   --  7.7*  --   --     Liver Function Tests:  Recent Labs Lab 04/29/2015 1726  AST 50*  ALT 27  ALKPHOS 132*  BILITOT 0.5  PROT 6.3*  ALBUMIN 2.4*   No results for input(s): LIPASE, AMYLASE in the  last 168 hours. No results for input(s): AMMONIA in the last 168 hours.  CBC:  Recent Labs Lab 05/03/2015 1726 05/19/15 0437 05/20/15 0503 05/20/15 2304 05/22/15 0839  WBC 35.1* 26.3* 24.1* 35.5* 32.5*  NEUTROABS 33.0*  --   --  32.3*  --   HGB 9.5* 8.1* 7.5* 7.9* 7.5*  HCT 29.2* 24.7* 23.6* 25.4* 22.9*  MCV 89.6 88.6 91.8 93.5 89.4  PLT 329 262 223 238 198    Cardiac Enzymes:  Recent Labs Lab 05/13/2015 1726 05/01/2015 2225 05/19/15 0233 05/19/15 1109  CKMB  --  10.0* 12.9* 1.3  TROPONINI 0.25* 0.07* 0.17* 0.06*    BNP: Invalid input(s): POCBNP  CBG:  Recent Labs Lab 05/22/15 0052 05/22/15 0157 05/22/15 0259 05/22/15 0718 05/22/15 1150  GLUCAP 151* 158* 178* 149* 154*    Microbiology: Results for orders placed or performed during the hospital encounter of 05/01/2015  Urine culture     Status: Abnormal   Collection Time: 05/06/2015  5:26 PM  Result Value Ref Range Status   Specimen Description URINE, RANDOM  Final   Special Requests NONE  Final   Culture >=100,000 COLONIES/mL ENTEROCOCCUS FAECALIS (A)  Final   Report Status 05/22/2015 FINAL  Final   Organism  ID, Bacteria ENTEROCOCCUS FAECALIS (A)  Final      Susceptibility   Enterococcus faecalis - MIC*    AMPICILLIN <=2 SENSITIVE Sensitive     LEVOFLOXACIN 0.5 SENSITIVE Sensitive     NITROFURANTOIN <=16 SENSITIVE Sensitive     VANCOMYCIN 1 SENSITIVE Sensitive     LINEZOLID 2 SENSITIVE Sensitive     * >=100,000 COLONIES/mL ENTEROCOCCUS FAECALIS  Blood Culture (routine x 2)     Status: None (Preliminary result)   Collection Time: 05/23/2015  6:13 PM  Result Value Ref Range Status   Specimen Description BLOOD LEFT ARM  Final   Special Requests BOTTLES DRAWN AEROBIC AND ANAEROBIC [place]  Final   Culture NO GROWTH 3 DAYS  Final   Report Status PENDING  Incomplete  Blood Culture (routine x 2)     Status: None (Preliminary result)   Collection Time: 05/26/2015  6:13 PM  Result Value Ref Range Status   Specimen  Description BLOOD RIGHT ASSIST CONTROL  Final   Special Requests BOTTLES DRAWN AEROBIC AND ANAEROBIC 8CC  Final   Culture NO GROWTH 3 DAYS  Final   Report Status PENDING  Incomplete  MRSA PCR Screening     Status: None   Collection Time: 05/19/2015 10:29 PM  Result Value Ref Range Status   MRSA by PCR NEGATIVE NEGATIVE Final    Comment:        The GeneXpert MRSA Assay (FDA approved for NASAL specimens only), is one component of a comprehensive MRSA colonization surveillance program. It is not intended to diagnose MRSA infection nor to guide or monitor treatment for MRSA infections.     Coagulation Studies:  Recent Labs  05/20/15 2304  LABPROT 15.7*  INR 1.23    Urinalysis: No results for input(s): COLORURINE, LABSPEC, PHURINE, GLUCOSEU, HGBUR, BILIRUBINUR, KETONESUR, PROTEINUR, UROBILINOGEN, NITRITE, LEUKOCYTESUR in the last 72 hours.  Invalid input(s): APPERANCEUR    Imaging: Ct Head Wo Contrast  05/22/2015  CLINICAL DATA:  Decreasing mental status EXAM: CT HEAD WITHOUT CONTRAST TECHNIQUE: Contiguous axial images were obtained from the base of the skull through the vertex without intravenous contrast. COMPARISON:  05/23/2015 FINDINGS: Diffuse atrophic and chronic white matter ischemic changes are noted similar to that seen on the prior exam. Some calcification of the tentorium cerebelli is noted. No findings to suggest acute hemorrhage, acute infarction or space-occupying mass lesion are noted. IMPRESSION: No acute abnormality noted. Chronic atrophic and ischemic changes. Electronically Signed   By: Inez Catalina M.D.   On: 05/22/2015 16:15   Ct Abdomen Pelvis W Contrast  05/22/2015  CLINICAL DATA:  Decreased mental status. Status post code on 04/22. Pus coming from rectum EXAM: CT ABDOMEN AND PELVIS WITH CONTRAST TECHNIQUE: Multidetector CT imaging of the abdomen and pelvis was performed using the standard protocol following bolus administration of intravenous contrast.  CONTRAST:  148m ISOVUE-300 IOPAMIDOL (ISOVUE-300) INJECTION 61% COMPARISON:  06/09/2007. FINDINGS: Lower chest: Dependent changes are identified in the lung bases posteriorly. No pleural or pericardial effusion. No airspace consolidation identified. Hepatobiliary: Stone within the neck of gallbladder measure 1 cm, image 25 of series 3. No suspicious liver abnormality. Pancreas: No inflammation or mass identified. Spleen: The spleen appears normal. Adrenals/Urinary Tract: Bilateral adrenal nodules have been present dating back to 05/29/07 and are favored to represent benign adenomas. Unremarkable appearance of the left kidney. Cyst within the right kidney measures 2.8 cm, image 42 of series 3. Circumferential wall thickening involving the urinary bladder is identified. Stomach/Bowel: There is an nasogastric  tube within the stomach. The small bowel loops have a normal caliber. There is no pathologic dilatation of the colon. Circumferential wall thickening and mucosal enhancement involving the rectum is noted, image 84 series 3. Vascular/Lymphatic: Aortic atherosclerosis noted. No aneurysm. 7 mm aortocaval node is identified, image 42 of series 3. Reproductive: There is diffuse scrotal edema. Scrotal wall thickness measures up to 1.6 cm, image 111 of series 3. Gas is identified within the left hemiscrotum, image 100 of series 3. This extends into the base of scrotum and up into the perineum. There is a gas and fluid collection within the left floor of pelvis which measures 5.5 x 2.5 x 6.6 cm. This has mass effect upon the rectum and prostate gland. The gas within the floor of pelvis extends into the left pelvic sidewall, image 78 of series 3 and into the left iliac fossa, image 75 of series 3. Other: As above. Musculoskeletal: No aggressive lytic or sclerotic bone lesions identified. Degenerative disc disease is noted within the lumbar spine. IMPRESSION: 1. Examination is positive for scrotal gangrene. Fluid and gas  tracks from the left hemiscrotum into the floor of pelvis, along the left pelvic sidewall and into the left iliac fossa. Gas and fluid collections exert mass effect upon the left side of rectum as well as the prostate gland. Critical Value/emergent results were called by telephone at the time of interpretation on 05/22/2015 at 4:58 pm to Dr. Hollice Espy , who verbally acknowledged these results. 2. Aortic atherosclerosis 3. Bilateral adrenal nodules, likely benign adenomas. 4. Gallstone. Electronically Signed   By: Kerby Moors M.D.   On: 05/22/2015 17:00   Dg Chest Port 1 View  05/22/2015  CLINICAL DATA:  80 year old male with acute respiratory failure. Insert follow-up EXAM: PORTABLE CHEST 1 VIEW COMPARISON:  05/21/2015. FINDINGS: Endotracheal tube tip now 2.1 cm above the carina. Left central line tip at the level of formation of the superior vena cava/distal left brachiocephalic vein. Right central line tip at the level of the distal superior vena cava. Nasogastric tube courses below the diaphragm. Tip is not included on the present exam. No gross pneumothorax. No infiltrate, congestive heart failure or pneumothorax. Heart size within normal limits. Calcified slightly tortuous aorta. IMPRESSION: Endotracheal tube tip has been retracted slightly now 2.1 cm above the carina. Remaining findings without change. Electronically Signed   By: Genia Del M.D.   On: 05/22/2015 07:46     Medications:   . feeding supplement (VITAL HIGH PROTEIN) Stopped (05/22/15 1715)  . fentaNYL infusion INTRAVENOUS Stopped (05/22/15 1054)   . antiseptic oral rinse  7 mL Mouth Rinse 10 times per day  . chlorhexidine gluconate (SAGE KIT)  15 mL Mouth Rinse BID  . free water  30 mL Per Tube Q4H  . heparin subcutaneous  5,000 Units Subcutaneous Q8H  . pantoprazole (PROTONIX) IV  40 mg Intravenous Q24H  . piperacillin-tazobactam (ZOSYN)  IV  3.375 g Intravenous Q12H  . senna  1 tablet Oral Daily   fentaNYL,  hydrALAZINE, metoprolol, midazolam, morphine injection  Assessment/ Plan:  Mr. LAVIN PETTEWAY is a 80 y.o. white male 80 y.o. 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   1. ESRD: TTS through [place].   -  plan for routine treatment Tuesday, but will discuss with family if they want to pursue dialysis vs comfort measures  2. Anemia of chronic kidney disease:  Hemoglobin 7.5 - epo with HD treatment  3. Secondary Hyperparathyroidism: with hypercalcemia on admission.  - he is on calcium acetate for binding as outpatient.   4. Diabetes Mellitus type II: with chronic kidney disease: hemoglobin A1c of 11.2% on 03/29/2015.    5. Acute respiratory failure - vent dependent at present    LOS: 5 Raden [place]ington 4/25/20179:15 AM

## 2015-05-23 NOTE — Progress Notes (Signed)
Patient is from [facility] for short term rehab. Patient is currently intubated. Palliative consult has been placed. Clinical Social Worker (CSW) will continue to follow and assist as needed.   Blima Rich, LCSW [phone]

## 2015-05-23 NOTE — Care Management (Signed)
Comfort plan of care is in the process of being initiated.  Palliative care updated.

## 2015-05-23 NOTE — Care Management (Signed)
Palliative care consult is pending. has been diagnosed with Fournier's gangrene of the scrotum .  Patient is not a candidate for surgical intervention.  Comfort measures are being discussed

## 2015-05-23 NOTE — Progress Notes (Signed)
  [facility] Progess Note    ASSESSMENT/PLAN   80 year old male past medical history of end-stage renal disease on dialysis, recent CVA, hematuria syndrome, GERD,hyperlipidemia, OSA on CPAP, bladder cell carcinoma, diabetes type 2, minute from the ER after being found unresponsive after dialysis with hypoxia. Subsequently suffered cardiac arrest on 4/22 for 8 minutes.    VDRF Unreponsive COPD Elevated troponin-peaked at 0.17-likely demand ischemia H/o Hypertension  ESRD on HD GASTROINTESTINAL H/O GERD  H/O bladder cancer s/p TURP ?Sepsis T2DM  ? CVA-initial CT head negative Now with possible anoxic encephalopathy status post cardiac arrest on 4/22.   Note: After extensive discussion with Dr. Ashby Dawes, the family member has decided to withdraw care on him.   [facility]

## 2015-05-23 NOTE — Progress Notes (Signed)
Palliative Care Update  Pt was extubated earlier today. He had some gasping respirations and was on a morphine drip to prevent air hunger. However, he continues to linger --breathing very shallow breaths with sats in the high 80's. He is on room air.  He is currently on 8 mg/ hr of morphine drip.    Pt to be transferred to 1 C for terminal comfort care.   Colleen Can, MD

## 2015-05-23 NOTE — Progress Notes (Signed)
Discussed with Dr Juanell Fairly who stated that as per discussion last night, patient was made comfort care discontinue dialyisis. Will sign off. Please call if there are any further questions

## 2015-05-24 NOTE — Plan of Care (Signed)
Problem: Pain Management: Goal: General experience of comfort will improve Outcome: Progressing Pt resting comfortably.

## 2015-05-24 NOTE — Progress Notes (Signed)
Palliative Medicine Inpatient Consult Follow Up Note   Name: Jason Aguilar Date: 05/24/2015 MRN: [id number]  DOB: [birth year]  Referring Physician: Vilinda Boehringer, MD  Palliative Care consult requested for this 80 y.o. male for goals of medical therapy in patient with post code anoxic brain injury.  PLAN: Pt has had a drop in O2 sats. Daily VS are gotten so we can assess whether pt is appropriate to stay here for terminal care, or whether pt might be appropriate to transfer to home, Hospice Home, or a facility.  Also, we have to assure pts are stable for transport. But, we do not need to correct abnormal VS.  This am, he had a sat of 73% and he was placed on O2.  He does not need this. He is not aware of his air hunger with the proper dosing of morphine.  Pt is hopelessly brain injured and his ability to maintain respirations adequate for life is waning. We will maintain his comfort (in the event there is an active part of his brain that might be aware of pain or air hunger).  Will continue with morphine drip, but no O2 is needed.  Family was not in the room this am when I visited.    REVIEW OF SYSTEMS:  Patient is not able to provide ROS due to anoxic brain injury  CODE STATUS: DNR   PAST MEDICAL HISTORY: Past Medical History  Diagnosis Date  . Glaucoma     both eyes Drs. Arrie [place] and [place]  . Hematuria syndrome     s/p negative cystoscopy  . GERD (gastroesophageal reflux disease) 2003    EGD  . Allergy   . Hyperlipidemia   . Obstructive sleep apnea of adult     CPAP at 8 cm H20  . Noncompliance with medication regimen   . 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 by [facility]: low grade noninvasive  . Chronic kidney disease (CKD), stage III (moderate)     with proteinuria  . Glaucoma   . Hypertension   . Diabetes mellitus     Type 2  . Cataract due to secondary diabetes mellitus ([place])   . COPD (chronic obstructive pulmonary disease) ([place])     secondary to tobacco  abuse  . History of tobacco abuse     60 pack year history; quit 1980    PAST SURGICAL HISTORY:  Past Surgical History  Procedure Laterality Date  . Elbow surgery    . Transurethral resection of bladder tumor  Mar 13 2011    [facility]  . Peripheral vascular catheterization N/A 01/03/2015    Procedure: Dialysis/Perma Catheter Insertion;  Surgeon: Katha Cabal, MD;  Location: [facility];  Service: Cardiovascular;  Laterality: N/A;  . Skin graft    . Cataract extraction      Vital Signs: BP 122/39 mmHg  Pulse 56  Temp(Src) 98.6 F (37 C) (Oral)  Resp 12  Ht 5\' 4"  (1.626 m)  Wt 61.9 kg (136 lb 7.4 oz)  BMI 23.41 kg/m2  SpO2 93% Filed Weights   05/21/15 0433 05/22/15 0442 05/23/15 0416  Weight: 59 kg (130 lb 1.1 oz) 56.9 kg (125 lb 7.1 oz) 61.9 kg (136 lb 7.4 oz)    Estimated body mass index is 23.41 kg/(m^2) as calculated from the following:   Height as of this encounter: 5\' 4"  (1.626 m).   Weight as of this encounter: 61.9 kg (136 lb 7.4 oz).  PHYSICAL EXAM: Completely unresponsive (  even to sternal rub) Eyelids are closed Breaths are deep and regular at this time --mouth open --some gasping type respirations Hrt rrr  Abd soft nt Ext no mottling at this time  LABS: CBC:    Component Value Date/Time   WBC 32.5* 05/22/2015 0839   WBC 6.7 09/17/2013 1022   HGB 7.5* 05/22/2015 0839   HGB 13.3 09/17/2013 1022   HCT 22.9* 05/22/2015 0839   HCT 40.8 09/17/2013 1022   PLT 198 05/22/2015 0839   PLT 185 09/17/2013 1022   MCV 89.4 05/22/2015 0839   MCV 91 09/17/2013 1022   NEUTROABS 32.3* 05/20/2015 2304   NEUTROABS 19.7* 07/25/2013 0417   LYMPHSABS 2.5 05/20/2015 2304   LYMPHSABS 0.7* 07/25/2013 0417   MONOABS 0.7 05/20/2015 2304   MONOABS 0.8 07/25/2013 0417   EOSABS 0.0 05/20/2015 2304   EOSABS 0.0 07/25/2013 0417   BASOSABS 0.0 05/20/2015 2304   BASOSABS 0.0 07/25/2013 0417   Comprehensive Metabolic Panel:    Component Value Date/Time   NA 144  05/22/2015 0839   NA 142 09/17/2013 1022   K 5.0 05/22/2015 0839   K 4.4 09/17/2013 1022   CL 104 05/22/2015 0839   CL 114* 09/17/2013 1022   CO2 29 05/22/2015 0839   CO2 19* 09/17/2013 1022   BUN 79* 05/22/2015 0839   BUN 23* 09/17/2013 1022   CREATININE 2.64* 05/22/2015 0839   CREATININE 2.09* 09/17/2013 1022   CREATININE 1.77* 10/19/2010 1655   GLUCOSE 172* 05/22/2015 0839   GLUCOSE 131* 09/17/2013 1022   CALCIUM 9.9 05/22/2015 0839   CALCIUM 9.4 09/17/2013 1022   AST 50* 05/17/2015 1726   AST 26 07/23/2013 2152   ALT 27 05/17/2015 1726   ALT 20 07/23/2013 2152   ALKPHOS 132* 05/24/2015 1726   ALKPHOS 99 07/23/2013 2152   BILITOT 0.5 05/15/2015 1726   BILITOT 0.7 07/23/2013 2152   PROT 6.3* 05/11/2015 1726   PROT 6.8 07/23/2013 2152   ALBUMIN 2.4* 05/27/2015 1726   ALBUMIN 3.6 07/23/2013 2152    More than 50% of the visit was spent in counseling/coordination of care: YES  Time Spent:  25 min

## 2015-05-24 NOTE — Care Management Important Message (Signed)
Important Message  Patient Details  Name: Jason Aguilar MRN: [id number] Date of Birth: 04/21/1935   Medicare Important Message Given:  Yes    [name] 05/24/2015, 10:47 AM

## 2015-05-24 NOTE — Progress Notes (Signed)
[place] [facility] [place] NAME: Jason Aguilar    MRN#:  [id number]  DATE OF BIRTH:  1935-02-12  SUBJECTIVE:  Hospital Day: 6 days Jason Aguilar is a 80 y.o. male presenting with Altered Mental Status .   Overnight events: extubated yesterday with transition to comfort cart Interval Events: no acute events, does not appear to be in current distress on morphine drip, family at bedside  REVIEW OF SYSTEMS:  Unable to provide at this time given mental status medical condition  DRUG ALLERGIES:   Allergies  Allergen Reactions  . Losartan Swelling  . Dristan Rash    VITALS:  Blood pressure 122/39, pulse 56, temperature 98.6 F (37 C), temperature source Oral, resp. rate 12, height 5\' 4"  (1.626 m), weight 136 lb 7.4 oz (61.9 kg), SpO2 93 %.  PHYSICAL EXAMINATION:   VITAL SIGNS: Filed Vitals:   05/24/15 0444 05/24/15 0836  BP: 115/43 122/39  Pulse: 73 56  Temp:    Resp: 20 12   GENERAL:79 y.o.male critically ill given mental status HEAD: Normocephalic, atraumatic.  EYES: Pupils equal, round, reactive to light. Unable to assess extraocular muscles given mental status/medical condition. No scleral icterus.  MOUTH: Dry mucosal membrane. Dentition poor. No abscess noted.  EAR, NOSE, THROAT: Clear without exudates. No external lesions.  NECK: Supple. No thyromegaly. No nodules. No JVD.  PULMONARY: Clear to ascultation, without wheeze rails or rhonci. No use of accessory muscles, Good respiratory effort. good air entry bilaterally CHEST: Sunken but Nontender to palpation.  CARDIOVASCULAR: S1 and S2. Regular rate and rhythm. No murmurs, rubs, or gallops. No edema. Pedal pulses 2+ bilaterally.  GASTROINTESTINAL: Soft, nontender, nondistended. No masses. Positive bowel sounds. No hepatosplenomegaly.  MUSCULOSKELETAL: No swelling, clubbing, or edema. Passive Range of motion full in all extremities.  NEUROLOGIC: Unable to assess given mental  status/medical condition SKIN: No ulceration, lesions, rashes, or cyanosis. Skin warm and dry. Turgor intact.  PSYCHIATRIC: Unable to assess given mental status/medical condition       LABORATORY PANEL:   CBC  Recent Labs Lab 05/22/15 0839  WBC 32.5*  HGB 7.5*  HCT 22.9*  PLT 198   ------------------------------------------------------------------------------------------------------------------  Chemistries   Recent Labs Lab 05/21/2015 1726  05/22/15 0839  NA 137  < > 144  K 3.2*  < > 5.0  CL 97*  < > 104  CO2 26  < > 29  GLUCOSE 144*  < > 172*  BUN 16  < > 79*  CREATININE 1.68*  < > 2.64*  CALCIUM 10.0  < > 9.9  MG  --   < > 2.2  AST 50*  --   --   ALT 27  --   --   ALKPHOS 132*  --   --   BILITOT 0.5  --   --   < > = values in this interval not displayed. ------------------------------------------------------------------------------------------------------------------  Cardiac Enzymes  Recent Labs Lab 05/19/15 1109  TROPONINI 0.06*   ------------------------------------------------------------------------------------------------------------------  RADIOLOGY:  Ct Head Wo Contrast  05/22/2015  CLINICAL DATA:  Decreasing mental status EXAM: CT HEAD WITHOUT CONTRAST TECHNIQUE: Contiguous axial images were obtained from the base of the skull through the vertex without intravenous contrast. COMPARISON:  05/17/2015 FINDINGS: Diffuse atrophic and chronic white matter ischemic changes are noted similar to that seen on the prior exam. Some calcification of the tentorium cerebelli is noted. No findings to suggest acute hemorrhage, acute infarction or space-occupying mass lesion are noted. IMPRESSION:  No acute abnormality noted. Chronic atrophic and ischemic changes. Electronically Signed   By: Inez Catalina M.D.   On: 05/22/2015 16:15   Ct Abdomen Pelvis W Contrast  05/22/2015  CLINICAL DATA:  Decreased mental status. Status post code on 04/22. Pus coming from rectum  EXAM: CT ABDOMEN AND PELVIS WITH CONTRAST TECHNIQUE: Multidetector CT imaging of the abdomen and pelvis was performed using the standard protocol following bolus administration of intravenous contrast. CONTRAST:  18mL ISOVUE-300 IOPAMIDOL (ISOVUE-300) INJECTION 61% COMPARISON:  06/09/2007. FINDINGS: Lower chest: Dependent changes are identified in the lung bases posteriorly. No pleural or pericardial effusion. No airspace consolidation identified. Hepatobiliary: Stone within the neck of gallbladder measure 1 cm, image 25 of series 3. No suspicious liver abnormality. Pancreas: No inflammation or mass identified. Spleen: The spleen appears normal. Adrenals/Urinary Tract: Bilateral adrenal nodules have been present dating back to 05/29/07 and are favored to represent benign adenomas. Unremarkable appearance of the left kidney. Cyst within the right kidney measures 2.8 cm, image 42 of series 3. Circumferential wall thickening involving the urinary bladder is identified. Stomach/Bowel: There is an nasogastric tube within the stomach. The small bowel loops have a normal caliber. There is no pathologic dilatation of the colon. Circumferential wall thickening and mucosal enhancement involving the rectum is noted, image 84 series 3. Vascular/Lymphatic: Aortic atherosclerosis noted. No aneurysm. 7 mm aortocaval node is identified, image 42 of series 3. Reproductive: There is diffuse scrotal edema. Scrotal wall thickness measures up to 1.6 cm, image 111 of series 3. Gas is identified within the left hemiscrotum, image 100 of series 3. This extends into the base of scrotum and up into the perineum. There is a gas and fluid collection within the left floor of pelvis which measures 5.5 x 2.5 x 6.6 cm. This has mass effect upon the rectum and prostate gland. The gas within the floor of pelvis extends into the left pelvic sidewall, image 78 of series 3 and into the left iliac fossa, image 75 of series 3. Other: As above.  Musculoskeletal: No aggressive lytic or sclerotic bone lesions identified. Degenerative disc disease is noted within the lumbar spine. IMPRESSION: 1. Examination is positive for scrotal gangrene. Fluid and gas tracks from the left hemiscrotum into the floor of pelvis, along the left pelvic sidewall and into the left iliac fossa. Gas and fluid collections exert mass effect upon the left side of rectum as well as the prostate gland. Critical Value/emergent results were called by telephone at the time of interpretation on 05/22/2015 at 4:58 pm to Dr. Hollice Espy , who verbally acknowledged these results. 2. Aortic atherosclerosis 3. Bilateral adrenal nodules, likely benign adenomas. 4. Gallstone. Electronically Signed   By: Kerby Moors M.D.   On: 05/22/2015 17:00    EKG:   Orders placed or performed during the hospital encounter of 05/24/2015  . EKG 12-Lead  . EKG 12-Lead    ASSESSMENT AND PLAN:   Jason Aguilar is a 80 y.o. male presenting with Altered Mental Status . Admitted 05/15/2015 : Day #: 6 days 1. Acute respiratory distress with hypoxia 2. Fournier's gangrene 3. Post cardiac arrest 4. Type 2 diabetes 5. COPD unspecified  Briefly patient admitted unresponsive suffered cardiac arrest intubated, found to have Fournier's gangrene-after many discussions with family transitioned to comfort measures only, patient extubated and transferred care unit currently on morphine drip. Appears to be comfortable but unstable-had oxygen saturations in mid 70s on room air  Palliative care input greatly appreciated  All the  records are reviewed and case discussed with Care Management/Social Workerr. Management plans discussed with the patient, family and they are in agreement.  CODE STATUS: Comfort measures only TOTAL TIME TAKING CARE OF THIS PATIENT: 33 minutes.   POSSIBLE D/C IN 1-2DAYS, DEPENDING ON CLINICAL CONDITION.   Hower,  Karenann Cai.D on 05/24/2015 at 2:45 PM  Between 7am to 6pm - Pager -  [phone]  After 6pm: House Pager: - [facility]  Office  [phone]  CC: Primary care physician; Valera Castle, MD

## 2015-05-24 NOTE — Plan of Care (Signed)
Problem: Pain Management: Goal: General experience of comfort will improve Outcome: Progressing Morphine gtt infusing at mL Hour. Increased Morphine gtt to 10 mL Hour. Increased Morphine gtt to 12 mL Hour.

## 2015-05-25 LAB — PATHOLOGIST SMEAR REVIEW

## 2015-05-29 NOTE — Plan of Care (Signed)
Problem: Pain Management: Goal: General experience of comfort will improve Outcome: Progressing Pt with no signs of discomfort on 12 mg/hr Morphine IV.

## 2015-05-29 NOTE — Progress Notes (Signed)
Notified Dr Estanislado Pandy that pt passed away at [facility] this am.  Called and spoke with wife Lovey Newcomer [phone]). Offered condolences.   Wife states that they do not have a funeral home at this time and no family will be coming in.  Wife requests call from nursing supervisor around 9 AM to make arrangements for the body.  Pt will be prepared and moved to the morgue. Dorna Bloom RN

## 2015-05-29 NOTE — Discharge Summary (Signed)
   [facility] at [place] NAME: Yugan Frantom    MR#:  [id number]  DATE OF BIRTH:  [birth year]  DATE OF ADMISSION:  05/28/2015 ADMITTING PHYSICIAN:  PCCM  DATE OF DISCHARGE: 06-14-2015  PRIMARY CARE PHYSICIAN: Valera Castle, MD    ADMISSION DIAGNOSIS:  Altered mental status [R41.82] UTI (lower urinary tract infection) [N39.0] Sepsis, due to unspecified organism ([place]) [A41.9] Altered mental status, unspecified altered mental status type [R41.82]  DISCHARGE DIAGNOSIS:   1. Acute respiratory distress with hypoxia 2. Fournier's gangrene 3. Post cardiac arrest 4. Type 2 diabetes 5. COPD unspecified   SECONDARY DIAGNOSIS:   Past Medical History  Diagnosis Date  . Glaucoma     both eyes Drs. Arrie [place] and [place]  . Hematuria syndrome     s/p negative cystoscopy  . GERD (gastroesophageal reflux disease) 2003    EGD  . Allergy   . Hyperlipidemia   . Obstructive sleep apnea of adult     CPAP at 8 cm H20  . Noncompliance with medication regimen   . 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 by [facility]: low grade noninvasive  . Chronic kidney disease (CKD), stage III (moderate)     with proteinuria  . Glaucoma   . Hypertension   . Diabetes mellitus     Type 2  . Cataract due to secondary diabetes mellitus ([place])   . COPD (chronic obstructive pulmonary disease) ([place])     secondary to tobacco abuse  . History of tobacco abuse     60 pack year history; quit [place]:  Assael Loeffelholz  is a 80 y.o. male admitted 04/29/2015 with chief complaint Altered Mental Status . Please see H&P performed by PCCM for further information. Briefly patient admitted in unresponsive state, intubated,  suffered cardiac arrest, course complicated when patient found to have Fournier's gangrene-after many discussions with family transitioned to comfort measures only, patient extubated and transferred out of intensive care unit.  Placed on morphine drip with the assistance of palliative care. Patient died around 5am 06-14-2015 nurse pronounced death, family updated as they were not at bedside.  DISCHARGE CONDITIONS:   deceased  CONSULTS OBTAINED:  Treatment Team:  Lavonia Dana, MD Teodoro Spray, MD Hollice Espy, MD Florene Glen, MD  Casper Pagliuca,  Karenann Cai.D on Jun 14, 2015 at 10:28 AM  Between 7am to 6pm - Pager - [phone]  After 6pm go to [email or web address] - [employer] [facility]  Office  [phone]  CC: Primary care physician; Valera Castle, MD

## 2015-05-29 DEATH — deceased

## 2017-12-04 IMAGING — MR MR MRA HEAD W/O CM
1 series · 26 of 48 positions shown · non-contrast
Comparison: MRA 04/03/2015.  MRI 04/15/2015

CLINICAL DATA: Stroke.

EXAM:
MRA HEAD WITHOUT CONTRAST
TECHNIQUE: Angiographic images of the Circle of Willis were obtained using MRA
technique without intravenous contrast.

[Series 6: TOF · axial · 0.8mm · 0.39mm/px · z∈[-38,+52]mm · 26 of 118 slices shown]
[im 1/118]
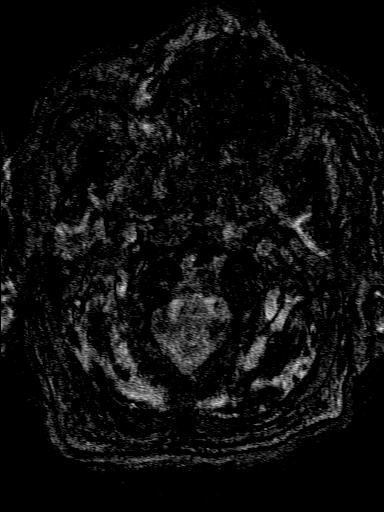
[im 3/118]
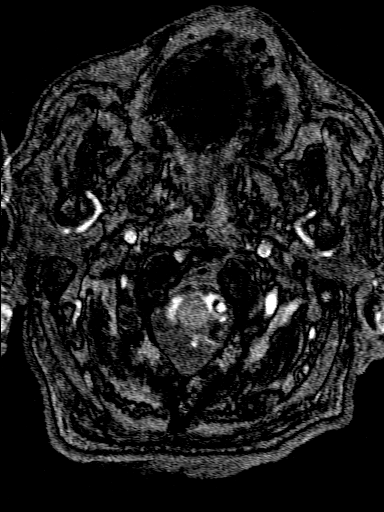
[im 5/118]
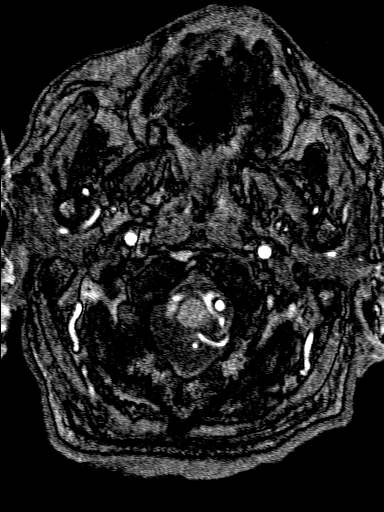
[im 8/118]
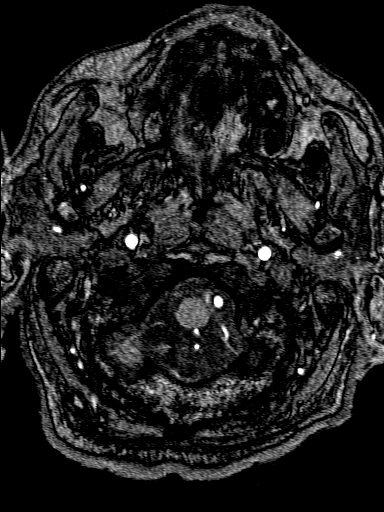
[im 10/118]
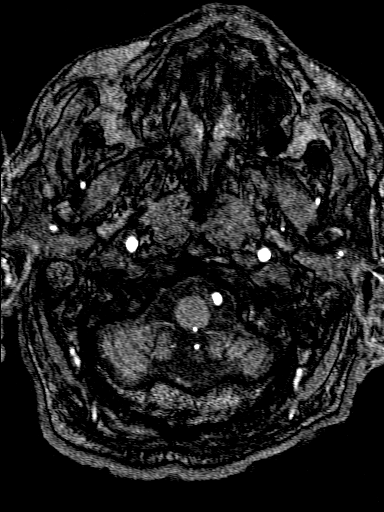
[im 13/118]
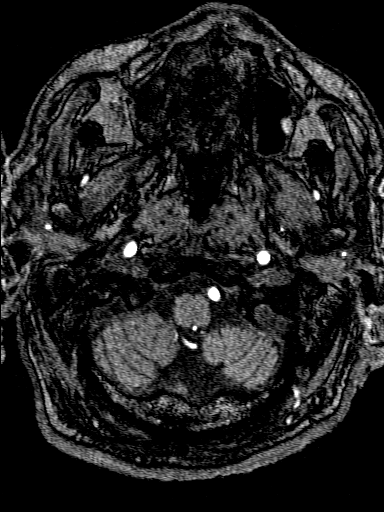
[im 15/118]
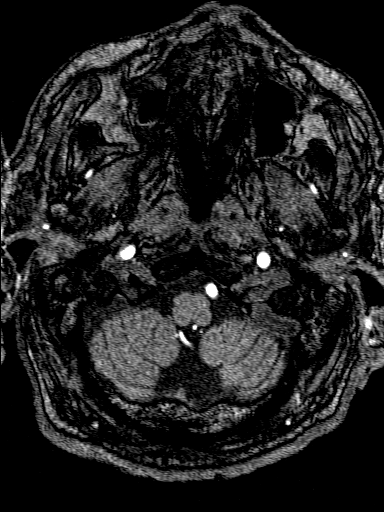
[im 18/118]
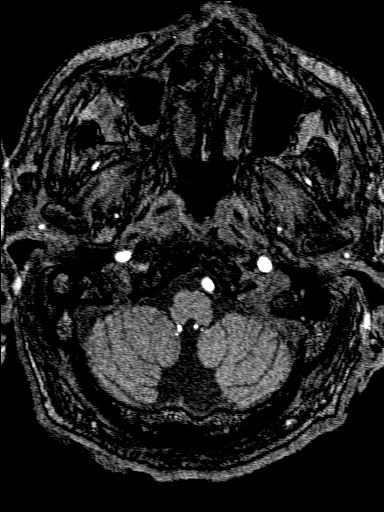
[im 20/118]
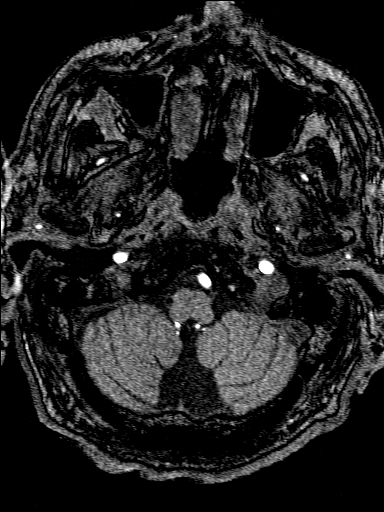
[im 23/118]
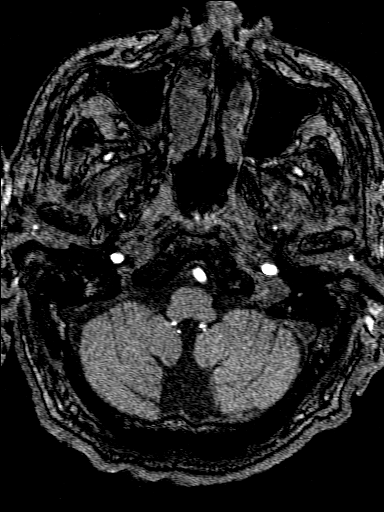
[im 25/118]
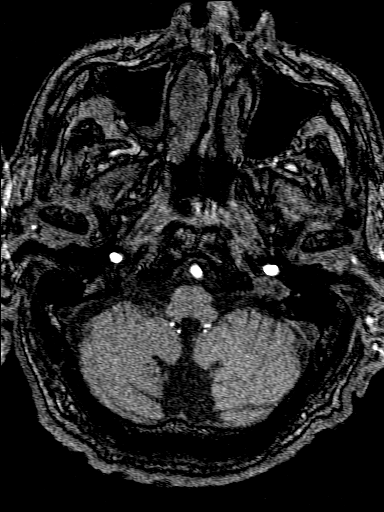
[im 28/118]
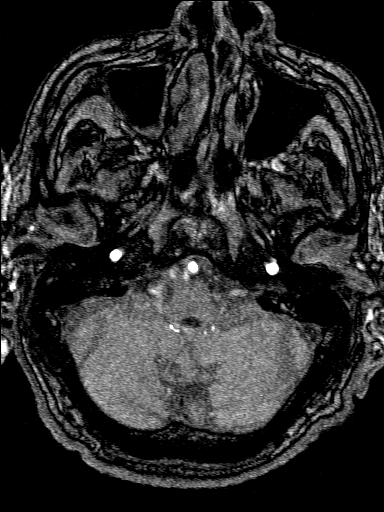
[im 30/118]
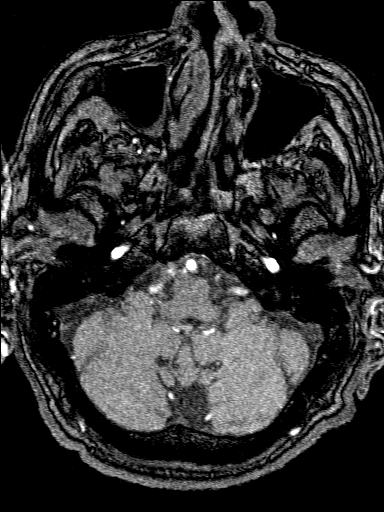
[im 33/118]
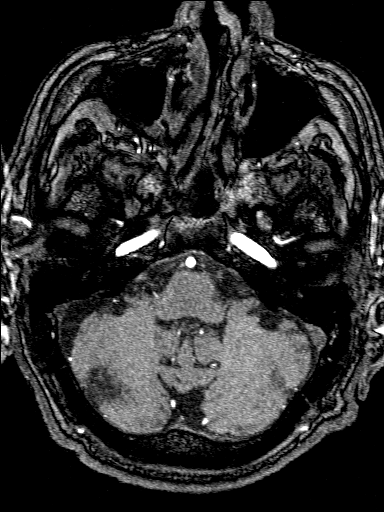
[im 35/118]
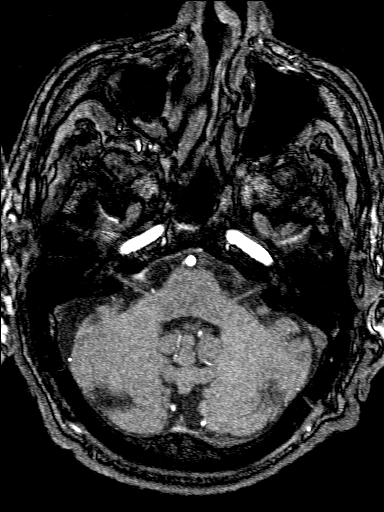
[im 38/118]
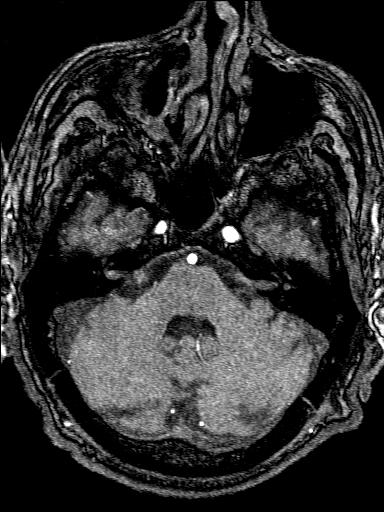
[im 40/118]
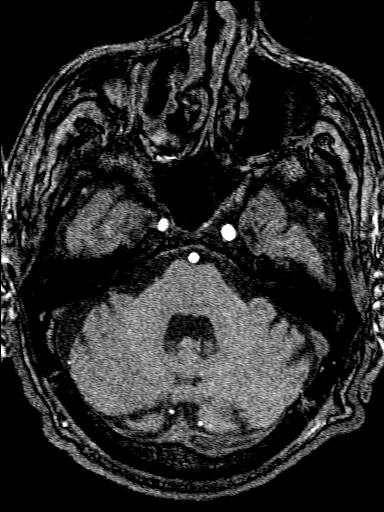
[im 43/118]
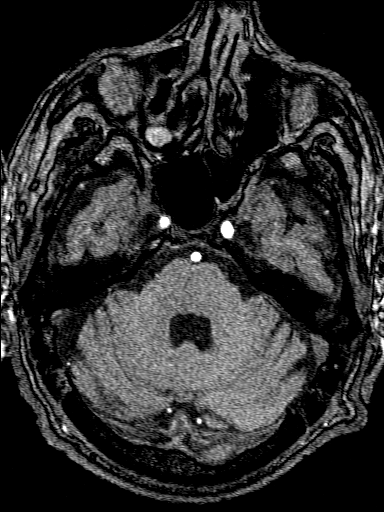
[im 45/118]
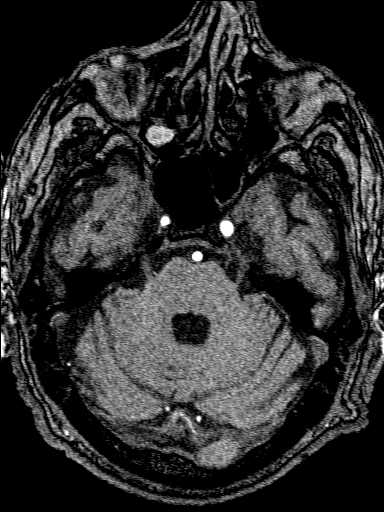
[im 53/118]
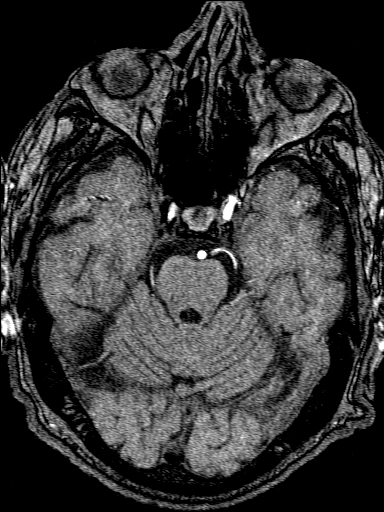
[im 60/118]
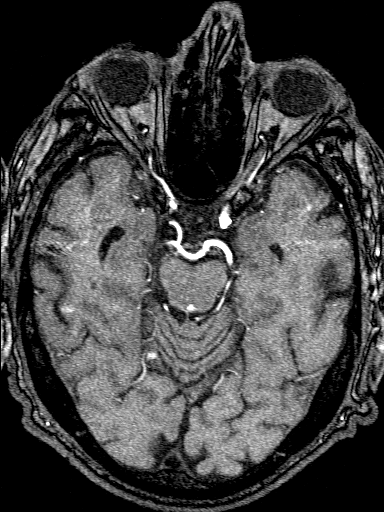
[im 68/118]
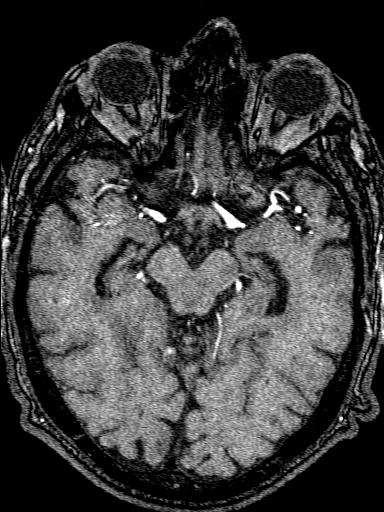
[im 83/118]
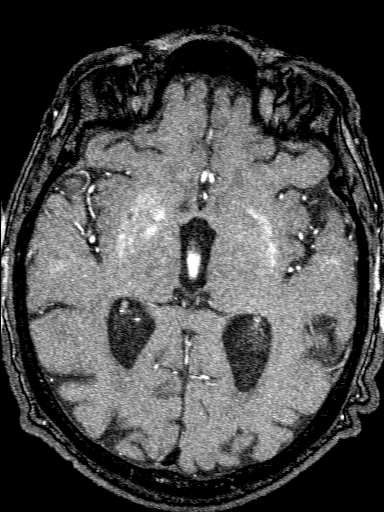
[im 98/118]
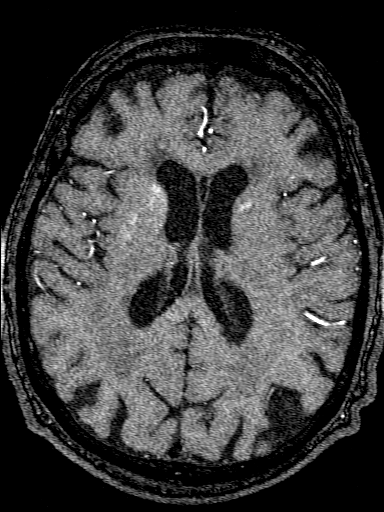
[im 100/118]
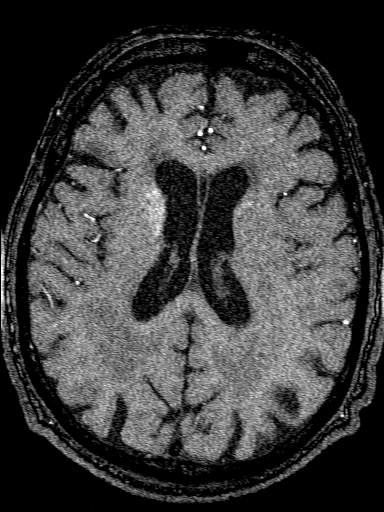
[im 113/118]
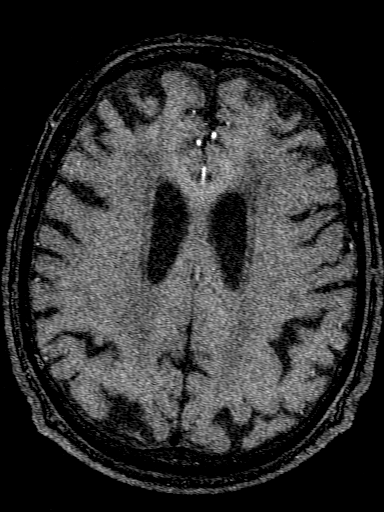

[26 of 48 positions shown; findings below may reference images not displayed]

FINDINGS: Distal right vertebral artery not visualized and may be hypoplastic
or end in PICA. This is unchanged from the prior study. Left
vertebral artery patent to the basilar. PICA patent on the left but
not visualized on the right. Basilar widely patent. Superior
cerebellar and posterior cerebral arteries patent without
significant stenosis. Patent right posterior communicating artery.

Severe tandem stenosis right cavernous carotid unchanged. Post
stenotic dilatation supraclinoid internal carotid artery on the
right unchanged. Hypoplastic right A1 segment unchanged. Moderate
stenosis right M1 segment with scattered atherosclerotic disease
right middle cerebral artery branches

Moderate stenosis left cavernous carotid. Mild poststenotic
dilatation left supraclinoid internal carotid artery. Severe
stenosis left MCA bifurcation unchanged from the prior study.
Multiple areas of branch stenosis left middle cerebral artery. Both
anterior cerebral arteries supplied from the left and appear patent.

Negative for cerebral aneurysm.
IMPRESSION: Distal right vertebral and not visualized probably hypoplastic

Severe stenosis right cavernous carotid and moderate stenosis left
cavernous carotid. Bilateral MCA disease.

No change from the prior study.
